# Patient Record
Sex: Female | Born: 1995
Health system: Southern US, Community
[De-identification: ages and names within clinical notes are randomized; demographics above are authoritative.]

## PROBLEM LIST (undated history)

## (undated) DIAGNOSIS — F419 Anxiety disorder, unspecified: Secondary | ICD-10-CM

## (undated) DIAGNOSIS — E079 Disorder of thyroid, unspecified: Secondary | ICD-10-CM

## (undated) HISTORY — PX: MOUTH SURGERY: SHX715

## (undated) HISTORY — PX: WISDOM TOOTH EXTRACTION: SHX21

## (undated) HISTORY — DX: Anxiety disorder, unspecified: F41.9

---
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
## 2018-06-23 ENCOUNTER — Other Ambulatory Visit: Payer: Self-pay

## 2018-06-23 ENCOUNTER — Encounter: Payer: Self-pay | Admitting: Child and Adolescent Psychiatry

## 2018-06-23 ENCOUNTER — Ambulatory Visit: Payer: BC Managed Care – PPO | Admitting: Child and Adolescent Psychiatry

## 2018-06-23 VITALS — BP 126/84 | HR 86 | Temp 99.0°F | Ht 64.96 in | Wt 197.6 lb

## 2018-06-23 DIAGNOSIS — F411 Generalized anxiety disorder: Secondary | ICD-10-CM | POA: Diagnosis not present

## 2018-06-23 DIAGNOSIS — F410 Panic disorder [episodic paroxysmal anxiety] without agoraphobia: Secondary | ICD-10-CM

## 2018-06-23 MED ORDER — BUSPIRONE HCL 10 MG PO TABS: 10.0000 mg | ORAL_TABLET | Freq: Two times a day (BID) | ORAL | 0 refills | Status: DC

## 2018-06-23 MED ORDER — SERTRALINE HCL 100 MG PO TABS: 100.0000 mg | ORAL_TABLET | Freq: Every day | ORAL | 0 refills | Status: DC

## 2018-06-23 NOTE — Progress Notes (Signed)
Psychiatric Initial Adult Assessment   Patient Identification: Colleen Tapia MRN:  [id number] Date of Evaluation:  06/24/2018 Referral Source: [facility] Chief Complaint:   Chief Complaint    Establish Care; Anxiety; Panic Attack     Visit Diagnosis:    ICD-10-CM   1. Generalized anxiety disorder with panic attacks F41.1 busPIRone (BUSPAR) 10 MG tablet   F41.0 sertraline (ZOLOFT) 100 MG tablet    History of Present Illness: This is a 22 year old Caucasian female, junior at [employer] with no significant medical history and psychiatric history significant of with generalized anxiety disorder with panic attacks and no previous psychiatric hospitalizations referred by patient's primary care physician for psychiatric evaluation and medication management as patient's medications were managed by patient's pediatrician who is now transferring her care to adult clinic.  Colleen Tapia presented on time for her scheduled appointment and was accompanied with her mother.  She was seen and evaluated alone and together with her mother.  Mother was brought into the office for interview and discussion of plan with Colleen Tapia's informed consent.  She reports that she started getting medication treatment for anxiety this March by her primary care physician who is now transferring her care to [facility] medicine clinic and therefore referred to this office for continuation of medication management for anxiety.  Colleen Tapia reports that she has been dealing with anxiety since later part of high school and first years of college however was able to manage up until December last year when she started having increased anxiety with "severe panic attacks".  She stated "anxiety got really bad..."  in the context of increased academic pressure in the college.  She reports that she often gets stuck on one thought about things that she did not do well or wrong and starting December last year it got really worse that she could not  stop these thoughts resulting in increased anxiety.   She reported that her pediatrician in March of this year started her on Zoloft which significantly helped her with her anxiety and panic attacks.  She reported that over the summer her pediatrician increased the dose of Zoloft 100 mg as a precaution.  She reports that her anxiety has been much more manageable and rates her anxiety at 5 out of 10 (10 = most anxious) and reported that she did not have any panic attacks since long time.  She also reports that she started seeing a counselor at her counseling center at [facility] which is also helped her with her anxiety.  She reports that she continues to plan to see her counselor at [facility].  She reports that during the time when her anxiety was worse she became more isolated from her friends and her mood was decreased.  She reports that her mood has improved over the time and describes her mood as "pretty chill" and has again became very sociable with her friends.  She does report depressed mood on some days out of a week however denies it being a consistent state of mood.  She reports that she was also not sleeping well at the time when her anxiety was worse because she could not decrease her thoughts which made her anxious at night however her sleep has improved since then.  She reports that she would have 1 night out of a week where she would have difficult time sleeping.  She otherwise denies any previous depressive episodes.  She denies any anhedonia, poor appetite, difficulties with concentration, psychomotor agitation or retardation, thoughts  of suicide.  She does endorse some decreased energy and feelings of worthlessness.  She denies any AVH, did not admit any delusions, denied symptoms of OCD, denied any eating disorder symptoms and denied any symptoms of mania/hypomania.  Her mother provided collateral information and reported that Colleen Tapia has been doing significantly better as compared to last year  when her anxiety was worse.  She states "it was a nigh and day..."  since Caeley started taking her Zoloft.  She denies any new concerns for Colleen Tapia.  Associated Signs/Symptoms:  Depression Symptoms:  depressed mood, feelings of worthlessness/guilt, disturbed sleep, (Hypo) Manic Symptoms:  Denies Anxiety Symptoms:  Excessive Worry, Panic Symptoms, Psychotic Symptoms:  Denies PTSD Symptoms: NA  Past Psychiatric History: Patient denies any inpatient psychiatric treatment in the past.  Patient reports that she has been seeing counselor at [facility] since last year and continuing to see counselor every other week.  She denies seeing a psychiatrist in the past.  She reports taking Zoloft 100 mg daily and BuSpar 10 mg daily since past May.  No history of SI or HI.  Previous Psychotropic Medications: Yes   Substance Abuse History in the last 12 months:  Yes.    Patient reports having 2-3 drinks every 2-3 weeks.  Denies any other substance abuse history.  Consequences of Substance Abuse: N/A NA  Past Medical History: Currently receiving Antibioitcs for UTI. Denies any other past medical history. Past Medical History:  Diagnosis Date  . Anxiety     Past Surgical History:  Procedure Laterality Date  . MOUTH SURGERY      Family Psychiatric History: Mother: Depression and Anxiety; Father: Depression and Anxiety  Family History:  Family History  Problem Relation Age of Onset  . Anxiety disorder Mother   . Depression Mother     Social History:   Social History   Socioeconomic History  . Marital status: Single    Spouse name: Not on file  . Number of children: 0  . Years of education: Not on file  . Highest education level: Some college, no degree  Occupational History  . Not on file  Social Needs  . Financial resource strain: Not hard at all  . Food insecurity:    Worry: Never true    Inability: Never true  . Transportation needs:     Medical: No    Non-medical: No  Tobacco Use  . Smoking status: Never Smoker  . Smokeless tobacco: Never Used  Substance and Sexual Activity  . Alcohol use: Yes    Alcohol/week: 3.0 - 5.0 standard drinks    Types: 3 - 4 Shots of liquor per week  . Drug use: Not Currently  . Sexual activity: Not Currently  Lifestyle  . Physical activity:    Days per week: 5 days    Minutes per session: 50 min  . Stress: Not on file  Relationships  . Social connections:    Talks on phone: Not on file    Gets together: Not on file    Attends religious service: More than 4 times per year    Active member of club or organization: Yes    Attends meetings of clubs or organizations: More than 4 times per year    Relationship status: Never married  Other Topics Concern  . Not on file  Social History Narrative  . 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  [facility] [place] [facility] [employer] [place] on holidays.  Allergies:  No Known Allergies  Metabolic Disorder Labs: No results found for: HGBA1C, MPG No results found for: PROLACTIN No results found for: CHOL, TRIG, HDL, CHOLHDL, VLDL, LDLCALC No results found for: TSH  Therapeutic Level Labs: No results found for: LITHIUM No results found for: CBMZ No results found for: VALPROATE  Current Medications: Current Outpatient Medications  Medication Sig Dispense Refill  . azithromycin (ZITHROMAX) 250 MG tablet     . busPIRone (BUSPAR) 10 MG tablet Take 1 tablet (10 mg total) by mouth 2 (two) times daily. 60 tablet 0  . cetirizine (ZYRTEC) 10 MG tablet Take by mouth.    . fluticasone (FLONASE) 50 MCG/ACT nasal spray Place into the nose.    . nitrofurantoin, macrocrystal-monohydrate, (MACROBID) 100 MG capsule     . sertraline (ZOLOFT) 100 MG tablet Take 1 tablet (100 mg  total) by mouth daily. 30 tablet 0  . SPRINTEC 28 0.25-35 MG-MCG tablet      No current facility-administered medications for this visit.     Musculoskeletal:  Gait & Station: normal Patient leans: N/A  Psychiatric Specialty Exam: Review of Systems  Constitutional: Negative for fever.  HENT: Negative.   Eyes: Negative.   Respiratory: Negative.   Cardiovascular: Negative.   Gastrointestinal: Negative.   Musculoskeletal: Negative.   Skin: Negative.   Neurological: Negative.   Endo/Heme/Allergies: Negative.   Psychiatric/Behavioral: Negative for depression, hallucinations, substance abuse and suicidal ideas. The patient is nervous/anxious. The patient does not have insomnia.     Blood pressure 126/84, pulse 86, temperature 99 F (37.2 C), temperature source Oral, height 5' 4.96" (1.65 m), weight 197 lb 9.6 oz (89.6 kg), last menstrual period 06/08/2018.Body mass index is 32.92 kg/m.  General Appearance: Casual and Well Groomed  Eye Contact:  Good  Speech:  Clear and Coherent and Normal Rate  Volume:  Normal  Mood:  "good"  Affect:  Appropriate, Congruent and Full Range  Thought Process:  Goal Directed and Linear  Orientation:  Full (Time, Place, and Person)  Thought Content:  Logical  Suicidal Thoughts:  No  Homicidal Thoughts:  No  Memory:  Immediate;   Good Recent;   Good Remote;   Good  Judgement:  Good  Insight:  Good  Psychomotor Activity:  Normal  Concentration:  Concentration: Good and Attention Span: Good  Recall:  Good  Fund of Knowledge:Good  Language: Good  Akathisia:  No  Handed:  N/A  AIMS (if indicated):  not done  Assets:  Communication Skills Desire for Improvement Financial Resources/Insurance Housing Leisure Time Physical Health Resilience Social Support Transportation Vocational/Educational  ADL's:  Intact  Cognition: WNL  Sleep:  Fair   Screenings:  PHQ-2: 1; PHQ 9: 6 GAD 7: 8 SCARED : 43   Assessment and Plan:   - 22 YO F  with hx of anxiety disorder on Zoloft since March of this year and in counseling [facility] [employer] [facility].  - Her hx appears to be consistent with generalized anxiety disorder with panic attacks.  - Her anxiety seemed to have worsened in the context of increased academic pressure. Increased anxiety also seemed to have decreased her mood that time.  - She seems to have responded well to medication management and therapy.  - She does appear to continue to have moderate level of  anxiety, however appears to be managing well and it does not seem to be impacting her functioning. She does not appear depressed.  - She would like to keep the same doses of her medications and reported that she has been tolerating them well.  - Pt and mother denies any safety concerns.    Plan: Problem 1: Generalized Anxiety with Panic attack Plan: - Continue Zoloft 100 mg Daily          - Continue Buspar 10 mg BID          - Continue with counseling at [facility]          - Follow up in one month.           - Pt was seen for 60 minutes for face to face and greater than 50% of time was spent on counseling and coordination of care with the patient/parent discussing diagnoses, medication side effects, prognosis.       [name], MD 11/27/20193:45 PM

## 2018-06-24 ENCOUNTER — Encounter: Payer: Self-pay | Admitting: Child and Adolescent Psychiatry

## 2018-06-24 NOTE — Progress Notes (Signed)
Colleen Tapia is a 22 y.o. female in treatment for Anxiety with panic attacks and displays the following risk factors for Suicide:  Demographic factors:  Adolescent or young adult and Caucasian Current Mental Status: No plan to harm self or others Loss Factors: None reported or identified Historical Factors: Family history of mental illness or substance abuse Risk Reduction Factors: Employed, Positive social support and Positive therapeutic relationship  CLINICAL FACTORS:  Anxiety  COGNITIVE FEATURES THAT CONTRIBUTE TO RISK: None    SUICIDE RISK:  Minimal: No identifiable suicidal ideation.    Mental Status: As mentioned in H&P from today's visit.   PLAN OF CARE: As mentioned in H&P from today's visit.    Darcel SmallingHiren M Demarius Archila, MD 06/24/2018, 4:27 PM

## 2018-07-29 ENCOUNTER — Encounter: Payer: Self-pay | Admitting: Child and Adolescent Psychiatry

## 2018-07-29 ENCOUNTER — Ambulatory Visit: Payer: Self-pay | Admitting: Child and Adolescent Psychiatry

## 2018-07-29 ENCOUNTER — Other Ambulatory Visit: Payer: Self-pay

## 2018-07-29 VITALS — BP 120/75 | HR 109 | Temp 98.0°F | Wt 198.8 lb

## 2018-07-29 DIAGNOSIS — F411 Generalized anxiety disorder: Secondary | ICD-10-CM

## 2018-07-29 DIAGNOSIS — F410 Panic disorder [episodic paroxysmal anxiety] without agoraphobia: Secondary | ICD-10-CM

## 2018-07-29 MED ORDER — BUSPIRONE HCL 10 MG PO TABS: 10.0000 mg | ORAL_TABLET | Freq: Two times a day (BID) | ORAL | 3 refills | Status: DC

## 2018-07-29 MED ORDER — SERTRALINE HCL 100 MG PO TABS: 100.0000 mg | ORAL_TABLET | Freq: Every day | ORAL | 3 refills | Status: DC

## 2018-07-29 NOTE — Progress Notes (Signed)
BH MD/PA/NP OP Progress Note  07/29/2018 1:18 PM Colleen Tapia  MRN:  [id number]  Chief Complaint: Medication management follow-up for anxiety. Chief Complaint    Follow-up; Medication Refill     HPI: Patient presented on time for her scheduled follow-up appointment for anxiety.  She was last seen for initial evaluation about a month ago for anxiety.  Patient transferred her treatment for anxiety from pediatrician to this clinic about a month ago since she turned 23.  Patient was continued on Zoloft 100 mg after the initial evaluation.  Patient denies any new concerns for today's visit.  She reports that her anxiety has been stable, and manageable.  She denies feeling depressed, eating and sleeping well, denies any suicidal or homicidal thoughts. She reported that she has been on vacation for the last 2 weeks and will be going back to college this weekend.  She reported that she spent her vacation well, and it was relaxing for her.  She reports that she is looking forward to be back at the college and has missed her friends.  We discussed to continue Zoloft 100 mg daily since her anxiety has been stable.  Colleen Tapia reports that she will be back in the town around spring break and will follow-up with this Clinical research associatewriter then.  Discussed to call the clinic if she needs to see this Clinical research associatewriter earlier. Visit Diagnosis:    ICD-10-CM   1. Generalized anxiety disorder with panic attacks F41.1 sertraline (ZOLOFT) 100 MG tablet   F41.0 busPIRone (BUSPAR) 10 MG tablet    Past Psychiatric History: As mentioned in initial H&P, reviewed today, no change  Past Medical History:  Past Medical History:  Diagnosis Date  . Anxiety     Past Surgical History:  Procedure Laterality Date  . MOUTH SURGERY      Family Psychiatric History: As mentioned in initial H&P, reviewed today, no change  Family History:  Family History  Problem Relation Age of Onset  . Anxiety disorder Mother   . Depression Mother     Social  History:  Social History   Socioeconomic History  . Marital status: Single    Spouse name: Not on file  . Number of children: 0  . Years of education: Not on file  . Highest education level: Some college, no degree  Occupational History  . Not on file  Social Needs  . Financial resource strain: Not hard at all  . Food insecurity:    Worry: Never true    Inability: Never true  . Transportation needs:    Medical: No    Non-medical: No  Tobacco Use  . Smoking status: Never Smoker  . Smokeless tobacco: Never Used  Substance and Sexual Activity  . Alcohol use: Yes    Alcohol/week: 3.0 - 5.0 standard drinks    Types: 3 - 4 Shots of liquor per week  . Drug use: Not Currently  . Sexual activity: Not Currently  Lifestyle  . Physical activity:    Days per week: 5 days    Minutes per session: 50 min  . Stress: Not on file  Relationships  . Social connections:    Talks on phone: Not on file    Gets together: Not on file    Attends religious service: More than 4 times per year    Active member of club or organization: Yes    Attends meetings of clubs or organizations: More than 4 times per year    Relationship status: Never married  Other Topics Concern  . Not on file  Social History Narrative  . Not on file    Allergies: No Known Allergies  Metabolic Disorder Labs: No results found for: HGBA1C, MPG No results found for: PROLACTIN No results found for: CHOL, TRIG, HDL, CHOLHDL, VLDL, LDLCALC No results found for: TSH  Therapeutic Level Labs: No results found for: LITHIUM No results found for: VALPROATE No components found for:  CBMZ  Current Medications: Current Outpatient Medications  Medication Sig Dispense Refill  . busPIRone (BUSPAR) 10 MG tablet Take 1 tablet (10 mg total) by mouth 2 (two) times daily. 60 tablet 3  . cetirizine (ZYRTEC) 10 MG tablet Take by mouth.    . fluticasone (FLONASE) 50 MCG/ACT nasal spray Place into the nose.    . sertraline (ZOLOFT)  100 MG tablet Take 1 tablet (100 mg total) by mouth daily. 30 tablet 3  . SPRINTEC 28 0.25-35 MG-MCG tablet      No current facility-administered medications for this visit.      Musculoskeletal:  Gait & Station: normal Patient leans: N/A  Psychiatric Specialty Exam: Review of Systems  Constitutional: Negative for fever.  Neurological: Negative for seizures.  Psychiatric/Behavioral: Negative for depression, hallucinations, substance abuse and suicidal ideas. The patient is nervous/anxious. The patient does not have insomnia.     Blood pressure 120/75, pulse (!) 109, temperature 98 F (36.7 C), temperature source Oral, weight 198 lb 12.8 oz (90.2 kg).Body mass index is 33.12 kg/m.  General Appearance: Casual and Fairly Groomed  Eye Contact:  Good  Speech:  Clear and Coherent and Normal Rate  Volume:  Normal  Mood:  "good"  Affect:  Appropriate, Congruent and Full Range  Thought Process:  Goal Directed and Linear  Orientation:  Full (Time, Place, and Person)  Thought Content: Logical   Suicidal Thoughts:  No  Homicidal Thoughts:  No  Memory:  Immediate;   Good Recent;   Good Remote;   Good  Judgement:  Good  Insight:  Good  Psychomotor Activity:  Normal  Concentration:  Concentration: Good and Attention Span: Good  Recall:  Good  Fund of Knowledge: Good  Language: Good  Akathisia:  No    AIMS (if indicated): not done  Assets:  Communication Skills Desire for Improvement Financial Resources/Insurance Housing Leisure Time Physical Health Social Support Talents/Skills Transportation Vocational/Educational  ADL's:  Intact  Cognition: WNL  Sleep:  Good   Screenings:   Assessment and Plan:   Screenings: On 11/27 PHQ-2: 1; PHQ 9: 6 GAD 7: 8 SCARED : 43  Assessment and Plan:   - 23 YO F with hx of anxiety disorder on Zoloft since March of this year and in counseling at [facility] [address].  - Her hx appears to be consistent with  generalized anxiety disorder with panic attacks.  - Her anxiety seemed to have worsened in the context of increased academic pressure in 2019. Increased anxiety also seemed to have decreased her mood that time.  - She seems to have responded well to medication management and therapy.  - She does appear to continue to have moderate level of anxiety, however appears to be managing well and it does not seem to be impacting her functioning. She does not appear depressed.  - She would like to keep the same doses of her medications and reported that she has been tolerating them well.  - Pt denies any safety concerns.    Plan: Problem 1: Generalized Anxiety with Panic  attack Plan: - Continue Zoloft 100 mg Daily          - Continue Buspar 10 mg BID          - [facility]          - Follow up in one month.           - Pt was seen for 15 minutes for face to face and greater than 50% of time was spent on counseling and coordination of care with the patient/parent discussing diagnoses, medication side effects, prognosis.      Darcel SmallingHiren M Aurorah Schlachter, MD 07/29/2018, 1:18 PM

## 2018-09-23 DIAGNOSIS — B9789 Other viral agents as the cause of diseases classified elsewhere: Secondary | ICD-10-CM | POA: Diagnosis not present

## 2018-09-23 DIAGNOSIS — R070 Pain in throat: Secondary | ICD-10-CM | POA: Diagnosis not present

## 2018-09-23 DIAGNOSIS — R509 Fever, unspecified: Secondary | ICD-10-CM | POA: Diagnosis not present

## 2018-09-23 DIAGNOSIS — J069 Acute upper respiratory infection, unspecified: Secondary | ICD-10-CM | POA: Diagnosis not present

## 2018-09-24 DIAGNOSIS — J019 Acute sinusitis, unspecified: Secondary | ICD-10-CM | POA: Diagnosis not present

## 2018-09-24 DIAGNOSIS — J029 Acute pharyngitis, unspecified: Secondary | ICD-10-CM | POA: Diagnosis not present

## 2018-09-26 DIAGNOSIS — J029 Acute pharyngitis, unspecified: Secondary | ICD-10-CM | POA: Diagnosis not present

## 2018-09-26 DIAGNOSIS — Z09 Encounter for follow-up examination after completed treatment for conditions other than malignant neoplasm: Secondary | ICD-10-CM | POA: Diagnosis not present

## 2018-09-26 DIAGNOSIS — J019 Acute sinusitis, unspecified: Secondary | ICD-10-CM | POA: Diagnosis not present

## 2018-10-04 DIAGNOSIS — Z113 Encounter for screening for infections with a predominantly sexual mode of transmission: Secondary | ICD-10-CM | POA: Diagnosis not present

## 2018-10-04 DIAGNOSIS — Z713 Dietary counseling and surveillance: Secondary | ICD-10-CM | POA: Diagnosis not present

## 2018-10-04 DIAGNOSIS — F411 Generalized anxiety disorder: Secondary | ICD-10-CM | POA: Diagnosis not present

## 2018-10-04 DIAGNOSIS — Z0100 Encounter for examination of eyes and vision without abnormal findings: Secondary | ICD-10-CM | POA: Diagnosis not present

## 2018-10-04 DIAGNOSIS — Z0000 Encounter for general adult medical examination without abnormal findings: Secondary | ICD-10-CM | POA: Diagnosis not present

## 2018-10-04 DIAGNOSIS — Z6832 Body mass index (BMI) 32.0-32.9, adult: Secondary | ICD-10-CM | POA: Diagnosis not present

## 2018-10-29 ENCOUNTER — Ambulatory Visit: Payer: BC Managed Care – PPO | Admitting: Child and Adolescent Psychiatry

## 2018-11-04 ENCOUNTER — Encounter: Payer: Self-pay | Admitting: Child and Adolescent Psychiatry

## 2018-11-04 ENCOUNTER — Ambulatory Visit (INDEPENDENT_AMBULATORY_CARE_PROVIDER_SITE_OTHER): Payer: Self-pay | Admitting: Child and Adolescent Psychiatry

## 2018-11-04 ENCOUNTER — Other Ambulatory Visit: Payer: Self-pay

## 2018-11-04 DIAGNOSIS — F411 Generalized anxiety disorder: Secondary | ICD-10-CM

## 2018-11-04 DIAGNOSIS — F410 Panic disorder [episodic paroxysmal anxiety] without agoraphobia: Secondary | ICD-10-CM

## 2018-11-04 MED ORDER — SERTRALINE HCL 100 MG PO TABS: 100.0000 mg | ORAL_TABLET | Freq: Every day | ORAL | 2 refills | Status: DC

## 2018-11-04 MED ORDER — BUSPIRONE HCL 10 MG PO TABS: 10.0000 mg | ORAL_TABLET | Freq: Two times a day (BID) | ORAL | 2 refills | Status: DC

## 2018-11-04 NOTE — Progress Notes (Signed)
Tc on  11-04-18 @ 4:41 Pt medical and surgical hx was reviewed with no changes. Pt allergies were reviewed with no changes. Pt medications and pharmacy were reviewed and updated. No vitals were done today this is a phone visit.

## 2018-11-04 NOTE — Progress Notes (Signed)
Virtual Visit via Telephone Note  I connected with Colleen Tapia on 11/04/18 at  4:00 PM EDT by telephone and verified that I am speaking with the correct person using two identifiers.   I discussed the limitations, risks, security and privacy concerns of performing an evaluation and management service by telephone and the availability of in person appointments. I also discussed with the patient that there may be a patient responsible charge related to this service. The patient expressed understanding and agreed to proceed.   History of Present Illness: 23 YO [place] F with hx of Generalized anxiety disorder with panic attack on Zoloft since March, 2019 referred by her PCP for psychiatric med management at the end of 2019 was evaluated over the telephone for medication management follow up for anxiety. Colleen Tapia shares that she was doing well until her college closed and she had to move with her parents. She reported that when they closed the college she had couple of panic attacks, denies any panic attacks since then which is past three weeks. She reports that she more stressed but reports that it is "normal" given the current circumstances of outbreak. She reports that she is sad that she is not able to see her friends but normalizes it. She reports that all of them are at home(parents and her brother) and although stressful they are managing each other ok. Denies being depressed, anhedonia, SI, reports eating and sleeping well. She reports that her anxiety is manageable, not impacting her functioning and would like to continue the same dose. She reported that she is adherent to her medications.   Observations/Objective:  Appearance: unable to assess since virtual visit was over the telephone Attitude: calm, cooperative with good eye contact Activity: unable to assess since virtual visit was over the telephone Speech: normal rate, rhythm and volume Thought Process: Logical, linear, and goal-directed.   Associations: no looseness, tangentiality, circumstantiality, flight of ideas, thought blocking or word salad noted Thought Content: (abnormal/psychotic thoughts): no abnormal or delusional thought process evidenced SI/HI: denies Si/Hi Perception: no illusions or visual/auditory hallucinations noted; Mood & Affect: "good"/unable to assess since virtual visit was over the telephone  Judgment & Insight: both fair Attention and Concentration : Good Cognition : WNL Language : Good ADL - Intact    Assessment and Plan: - 23 YO F with hx of anxiety disorder on Zoloft since March of this year and in counseling [facility] [employer].  - Her hx appears to be consistent with generalized anxiety disorder with panic attacks.  - Her anxiety seemed to have worsened in the context of increased academic pressure in 2019. Increased anxiety also seemed to have decreased her mood that time.  - She seems to have responded well to medication management and therapy.  - She does appear to continue to have mild to moderate level of anxiety which seems to have increase due to outbreak, however appears to be managing well and it does not seem to be impacting her functioning. She does not appear depressed.  - She would like to keep the same doses of her medications and reported that she has been tolerating them well.   Plan: Problem 1: Generalized Anxiety with Panic attack(chronic) Plan: - Continue Zoloft 100 mg Daily - Continue Buspar 10 mg BID - [facility] - Follow up in one month.  - Pt was seen for 15 minutes for face to face and greater than 50% of time was  spent on counseling and coordination of care with the patient/parent discussing diagnoses, medication side effects, prognosis.    Follow Up Instructions:    I discussed the assessment and treatment plan with the patient. The patient was provided an  opportunity to ask questions and all were answered. The patient agreed with the plan and demonstrated an understanding of the instructions.   The patient was advised to call back or seek an in-person evaluation if the symptoms worsen or if the condition fails to improve as anticipated.  I provided 15 minutes of non-face-to-face time during this encounter.   [name], MD

## 2018-12-23 DIAGNOSIS — J302 Other seasonal allergic rhinitis: Secondary | ICD-10-CM | POA: Insufficient documentation

## 2018-12-23 DIAGNOSIS — F411 Generalized anxiety disorder: Secondary | ICD-10-CM | POA: Diagnosis not present

## 2019-01-05 DIAGNOSIS — S93402A Sprain of unspecified ligament of left ankle, initial encounter: Secondary | ICD-10-CM | POA: Diagnosis not present

## 2019-01-12 ENCOUNTER — Encounter: Payer: Self-pay | Admitting: Child and Adolescent Psychiatry

## 2019-01-12 ENCOUNTER — Other Ambulatory Visit: Payer: Self-pay

## 2019-01-12 ENCOUNTER — Ambulatory Visit (INDEPENDENT_AMBULATORY_CARE_PROVIDER_SITE_OTHER): Payer: Self-pay | Admitting: Child and Adolescent Psychiatry

## 2019-01-12 DIAGNOSIS — F410 Panic disorder [episodic paroxysmal anxiety] without agoraphobia: Secondary | ICD-10-CM

## 2019-01-12 DIAGNOSIS — F411 Generalized anxiety disorder: Secondary | ICD-10-CM

## 2019-01-12 MED ORDER — SERTRALINE HCL 100 MG PO TABS: 100.0000 mg | ORAL_TABLET | Freq: Every day | ORAL | 2 refills | Status: DC

## 2019-01-12 MED ORDER — BUSPIRONE HCL 10 MG PO TABS: 10.0000 mg | ORAL_TABLET | Freq: Two times a day (BID) | ORAL | 2 refills | Status: DC

## 2019-01-12 NOTE — Progress Notes (Signed)
Virtual Visit via Video Note  I connected with Colleen Tapia on 01/12/19 at  4:00 PM EDT by a video enabled telemedicine application and verified that I am speaking with the correct person using two identifiers.  Location: Patient: Home Provider: Office   I discussed the limitations of evaluation and management by telemedicine and the availability of in person appointments. The patient expressed understanding and agreed to proceed.    BH MD/PA/NP OP Progress Note  01/12/2019 4:07 PM Colleen Tapia  MRN:  [id number]  Chief Complaint: Medication management follow up for anxiety HPI: This is a 23 year old Caucasian female with generalized anxiety disorder with panic attacks on Zoloft since March 2019 was seen and evaluated for medication management follow-up for anxiety.  Amil AmenJulia appeared calm, cooperative, pleasant with bright and broad affect. 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 She reports that her anxiety is stable, denies any new psychosocial stressor, reports eating well and sleep is fair. She denies feeling depressed, denies any thoughts of suicide or self harm. She is not in counseling but [facility] when she resumes her studies next semester.   Visit Diagnosis:    ICD-10-CM   1. Generalized anxiety disorder with panic attacks  F41.1 busPIRone (BUSPAR) 10 MG tablet   F41.0 sertraline (ZOLOFT) 100 MG tablet    Past Psychiatric History: As mentioned in initial H&P, reviewed today, no change   Past Medical History:  Past Medical History:  Diagnosis Date  . Anxiety     Past Surgical History:  Procedure Laterality Date  . MOUTH SURGERY      Family Psychiatric History: As mentioned in initial H&P, reviewed today, no change   Family History:  Family History   Problem Relation Age of Onset  . Anxiety disorder Mother   . Depression Mother     Social History:  Social History   Socioeconomic History  . Marital status: Single    Spouse name: Not on file  . Number of children: 0  . Years of education: Not on file  . Highest education level: Some college, no degree  Occupational History  . Not on file  Social Needs  . Financial resource strain: Not hard at all  . Food insecurity    Worry: Never true    Inability: Never true  . Transportation needs    Medical: No    Non-medical: No  Tobacco Use  . Smoking status: Never Smoker  . Smokeless tobacco: Never Used  Substance and Sexual Activity  . Alcohol use: Yes    Alcohol/week: 3.0 - 5.0 standard drinks    Types: 3 - 4 Shots of liquor per week  . Drug use: Not Currently  . Sexual activity: Not Currently  Lifestyle  . Physical activity    Days per week: 5 days    Minutes per session: 50 min  . Stress: Not on file  Relationships  . Social Musicianconnections    Talks on phone: Not on file    Gets together: Not on file    Attends religious service: More than 4 times per year    Active member of club or organization: Yes    Attends meetings of clubs or organizations: More than 4 times per year    Relationship status: Never  married  Other Topics Concern  . Not on file  Social History Narrative  . Not on file    Allergies: No Known Allergies  Metabolic Disorder Labs: No results found for: HGBA1C, MPG No results found for: PROLACTIN No results found for: CHOL, TRIG, HDL, CHOLHDL, VLDL, LDLCALC No results found for: TSH  Therapeutic Level Labs: No results found for: LITHIUM No results found for: VALPROATE No components found for:  CBMZ  Current Medications: Current Outpatient Medications  Medication Sig Dispense Refill  . busPIRone (BUSPAR) 10 MG tablet Take 1 tablet (10 mg total) by mouth 2 (two) times daily. 60 tablet 2  . cetirizine (ZYRTEC) 10 MG tablet Take by mouth.    .  fluticasone (FLONASE) 50 MCG/ACT nasal spray Place into the nose.    . sertraline (ZOLOFT) 100 MG tablet Take 1 tablet (100 mg total) by mouth daily. 30 tablet 2  . SPRINTEC 28 0.25-35 MG-MCG tablet      No current facility-administered medications for this visit.      Musculoskeletal: Strength & Muscle Tone: unable to assess since visit was over the telemedicine. Gait & Station: unable to assess since visit was over the telemedicine. Patient leans: N/A  Psychiatric Specialty Exam: ROSReview of 12 systems negative except as mentioned in HPI  There were no vitals taken for this visit.There is no height or weight on file to calculate BMI.  General Appearance: Casual and Well Groomed  Eye Contact:  Good  Speech:  Clear and Coherent and Normal Rate  Volume:  Normal  Mood:  "good"  Affect:  Appropriate, Congruent and Full Range  Thought Process:  Goal Directed and Linear  Orientation:  Full (Time, Place, and Person)  Thought Content: Logical   Suicidal Thoughts:  No  Homicidal Thoughts:  No  Memory:  Immediate;   Fair Recent;   Fair Remote;   Fair  Judgement:  Good  Insight:  Good  Psychomotor Activity:  Normal  Concentration:  Concentration: Good and Attention Span: Good  Recall:  Good  Fund of Knowledge: Good  Language: Good  Akathisia:  No    AIMS (if indicated): not done  Assets:  Communication Skills Desire for Improvement Financial Resources/Insurance Housing Leisure Time [place] Talents/Skills Transportation Vocational/Educational  ADL's:  Intact  Cognition: WNL  Sleep:  Good   Screenings:   Assessment and Plan:   - 23 yo F with hx of anxiety disorder on Zoloft since March of 2019 and was previously in counseling at [facility].  - Her hx appears to be consistent with generalized anxiety disorder with panic attacks.  - Her anxiety seemed to have worsened in the context of increased academic pressurein  2019. Increased anxiety also seemed to have decreased her mood that time.  - She seems to continue to respond well to medication.    Plan: Problem 1: Generalized Anxiety with Panic attack(chronic) Plan: - Continue Zoloft 100 mg Daily - Continue Buspar 10 mg BID - Resume with counseling at [facility] in fall - Follow up in one month.  - Pt was seen for98minutes for face to face and greater than 50% of time was spent on counseling and coordination of care with the patient/parent discussing diagnoses, treatment plan, medications and recommendation to resume counseling at [place] [facility] once she starts her fall semester.     Follow Up Instructions:    I discussed the assessment and treatment plan with the patient. The patient  was provided an opportunity to ask questions and all were answered. The patient agreed with the plan and demonstrated an understanding of the instructions.   The patient was advised to call back or seek an in-person evaluation if the symptoms worsen or if the condition fails to improve as anticipated.  I provided 15 minutes of non-face-to-face time during this encounter.   Darcel SmallingHiren M Umrania, MD    Darcel SmallingHiren M Umrania, MD 01/12/2019, 4:07 PM

## 2019-01-22 ENCOUNTER — Other Ambulatory Visit: Payer: Self-pay | Admitting: *Deleted

## 2019-01-22 DIAGNOSIS — Z20822 Contact with and (suspected) exposure to covid-19: Secondary | ICD-10-CM

## 2019-01-22 DIAGNOSIS — R6889 Other general symptoms and signs: Secondary | ICD-10-CM | POA: Diagnosis not present

## 2019-01-28 LAB — NOVEL CORONAVIRUS, NAA: SARS-CoV-2, NAA: NOT DETECTED

## 2019-02-03 DIAGNOSIS — S62657A Nondisplaced fracture of medial phalanx of left little finger, initial encounter for closed fracture: Secondary | ICD-10-CM | POA: Diagnosis not present

## 2019-03-17 ENCOUNTER — Encounter: Payer: Self-pay | Admitting: Child and Adolescent Psychiatry

## 2019-03-17 ENCOUNTER — Other Ambulatory Visit: Payer: Self-pay

## 2019-03-17 ENCOUNTER — Ambulatory Visit (INDEPENDENT_AMBULATORY_CARE_PROVIDER_SITE_OTHER): Payer: Self-pay | Admitting: Child and Adolescent Psychiatry

## 2019-03-17 DIAGNOSIS — F411 Generalized anxiety disorder: Secondary | ICD-10-CM

## 2019-03-17 DIAGNOSIS — F410 Panic disorder [episodic paroxysmal anxiety] without agoraphobia: Secondary | ICD-10-CM

## 2019-03-17 MED ORDER — SERTRALINE HCL 100 MG PO TABS: 100.0000 mg | ORAL_TABLET | Freq: Every day | ORAL | 2 refills | Status: DC

## 2019-03-17 MED ORDER — BUSPIRONE HCL 10 MG PO TABS: 10.0000 mg | ORAL_TABLET | Freq: Two times a day (BID) | ORAL | 2 refills | Status: DC

## 2019-03-17 NOTE — Progress Notes (Signed)
Virtual Visit via Video Note  I connected with Colleen Tapia on 03/17/19 at  2:30 PM EDT by a video enabled telemedicine application and verified that I am speaking with the correct person using two identifiers.  Location: Patient: Home Provider: Office   I discussed the limitations of evaluation and management by telemedicine and the availability of in person appointments. The patient expressed understanding and agreed to proceed.    I discussed the assessment and treatment plan with the patient. The patient was provided an opportunity to ask questions and all were answered. The patient agreed with the plan and demonstrated an understanding of the instructions.   The patient was advised to call back or seek an in-person evaluation if the symptoms worsen or if the condition fails to improve as anticipated.  I provided 20 minutes of non-face-to-face time during this encounter.   Orlene Erm, MD     [facility] MD/PA/NP OP Progress Note  03/17/2019 2:50 PM Colleen Tapia  MRN:  [id number]  Chief Complaint: Medication management follow-up for anxiety.  HPI: This is a 23 year old Caucasian female with generalized anxiety disorder with panic attacks on Zoloft since March 2019 was seen and evaluated over telemedicine encounter for medication management follow-up.  Tykisha appeared calm, cooperative, pleasant with bright and broad range of affect.  She reports that she had moved to [facility] for her school year.  She reports that she had only 1 class in person but since today they have moved all the classes online.  She reports that she had slightly elevated level of anxiety not knowing what insisted will be doing for the fall semester but reports that anxiety has been stable and manageable.  She denies it impacting her functioning.  She reports that she has continued taking Zoloft 100 mg once a day and BuSpar 10 mg 2 times a day.  She reports that her mood has been ""good", denies feeling depressed,  denies anhedonia, denies thoughts of self-harm or suicide.  She reports that she does not have a counselor at the counseling center anymore but will be looking into supportive counseling from counseling center if they offer virtual.  She has been eating and sleeping well.   Visit Diagnosis:    ICD-10-CM   1. Generalized anxiety disorder with panic attacks  F41.1 sertraline (ZOLOFT) 100 MG tablet   F41.0 busPIRone (BUSPAR) 10 MG tablet    Past Psychiatric History: As mentioned in initial H&P, reviewed today, no change   Past Medical History:  Past Medical History:  Diagnosis Date  . Anxiety     Past Surgical History:  Procedure Laterality Date  . MOUTH SURGERY      Family Psychiatric History: As mentioned in initial H&P, reviewed today, no change   Family History:  Family History  Problem Relation Age of Onset  . Anxiety disorder Mother   . Depression Mother     Social History:  Social History   Socioeconomic History  . Marital status: Single    Spouse name: Not on file  . Number of children: 0  . Years of education: Not on file  . Highest education level: Some college, no degree  Occupational History  . Not on file  Social Needs  . Financial resource strain: Not hard at all  . Food insecurity    Worry: Never true    Inability: Never true  . Transportation needs    Medical: No    Non-medical: No  Tobacco Use  . Smoking  status: Never Smoker  . Smokeless tobacco: Never Used  Substance and Sexual Activity  . Alcohol use: Yes    Alcohol/week: 3.0 - 5.0 standard drinks    Types: 3 - 4 Shots of liquor per week  . Drug use: Not Currently  . Sexual activity: Not Currently  Lifestyle  . Physical activity    Days per week: 5 days    Minutes per session: 50 min  . Stress: Not on file  Relationships  . Social Musicianconnections    Talks on phone: Not on file    Gets together: Not on file    Attends religious service: More than 4 times per year    Active member of club  or organization: Yes    Attends meetings of clubs or organizations: More than 4 times per year    Relationship status: Never married  Other Topics Concern  . Not on file  Social History Narrative  . Not on file    Allergies: No Known Allergies  Metabolic Disorder Labs: No results found for: HGBA1C, MPG No results found for: PROLACTIN No results found for: CHOL, TRIG, HDL, CHOLHDL, VLDL, LDLCALC No results found for: TSH  Therapeutic Level Labs: No results found for: LITHIUM No results found for: VALPROATE No components found for:  CBMZ  Current Medications: Current Outpatient Medications  Medication Sig Dispense Refill  . busPIRone (BUSPAR) 10 MG tablet Take 1 tablet (10 mg total) by mouth 2 (two) times daily. 60 tablet 2  . cetirizine (ZYRTEC) 10 MG tablet Take by mouth.    . fluticasone (FLONASE) 50 MCG/ACT nasal spray Place into the nose.    . sertraline (ZOLOFT) 100 MG tablet Take 1 tablet (100 mg total) by mouth daily. 30 tablet 2  . SPRINTEC 28 0.25-35 MG-MCG tablet      No current facility-administered medications for this visit.      Musculoskeletal: Strength & Muscle Tone: unable to assess since visit was over the telemedicine.. Gait & Station: unable to assess since visit was over the telemedicine. Patient leans: N/A  Psychiatric Specialty Exam: ROSReview of 12 systems negative except as mentioned in HPI   There were no vitals taken for this visit.There is no height or weight on file to calculate BMI.  General Appearance: Casual and Well Groomed  Eye Contact:  Good  Speech:  Clear and Coherent and Normal Rate  Volume:  Normal  Mood:  "good"  Affect:  Appropriate, Congruent and Full Range  Thought Process:  Goal Directed and Linear  Orientation:  Full (Time, Place, and Person)  Thought Content: Logical   Suicidal Thoughts:  No  Homicidal Thoughts:  No  Memory:  Immediate;   Fair Recent;   Fair Remote;   Fair  Judgement:  Good  Insight:  Good   Psychomotor Activity:  Normal  Concentration:  Concentration: Good and Attention Span: Good  Recall:  Good  Fund of Knowledge: Good  Language: Good  Akathisia:  No    AIMS (if indicated): not done  Assets:  Communication Skills Desire for Improvement Financial Resources/Insurance Housing Leisure Time Physical Health Social Support Talents/Skills Transportation Vocational/Educational  ADL's:  Intact  Cognition: WNL  Sleep:  Good   Screenings:   Assessment and Plan:   - 23 yo F with hx of anxiety disorder on Zoloft since March of 2019 and [facility]  She is diagnosed with generalized anxiety disorder with panic attacks and seems to  be responding well to her current medication which is Zoloft 100 mg once a day and BuSpar 10 mg 2 times a day.  She is currently not in counseling but will be looking for resources at counseling center at an [facility].    Plan: Problem 1: Generalized Anxiety with Panic attack(chronic) Plan: - Continue Zoloft 100 mg Daily - Continue Buspar 10 mg BID - [facility], pt will look into this.  - Follow up in three months.        Darcel SmallingHiren M Izzabella Besse, MD 03/17/2019, 2:50 PM

## 2019-03-21 DIAGNOSIS — Z20828 Contact with and (suspected) exposure to other viral communicable diseases: Secondary | ICD-10-CM | POA: Diagnosis not present

## 2019-03-22 DIAGNOSIS — Z6833 Body mass index (BMI) 33.0-33.9, adult: Secondary | ICD-10-CM | POA: Diagnosis not present

## 2019-03-22 DIAGNOSIS — R070 Pain in throat: Secondary | ICD-10-CM | POA: Diagnosis not present

## 2019-03-22 DIAGNOSIS — J019 Acute sinusitis, unspecified: Secondary | ICD-10-CM | POA: Diagnosis not present

## 2019-05-30 ENCOUNTER — Ambulatory Visit: Payer: Self-pay | Admitting: Child and Adolescent Psychiatry

## 2019-06-17 ENCOUNTER — Encounter: Payer: Self-pay | Admitting: Child and Adolescent Psychiatry

## 2019-06-17 ENCOUNTER — Other Ambulatory Visit: Payer: Self-pay

## 2019-06-17 ENCOUNTER — Ambulatory Visit (INDEPENDENT_AMBULATORY_CARE_PROVIDER_SITE_OTHER): Payer: Self-pay | Admitting: Child and Adolescent Psychiatry

## 2019-06-17 DIAGNOSIS — F410 Panic disorder [episodic paroxysmal anxiety] without agoraphobia: Secondary | ICD-10-CM

## 2019-06-17 DIAGNOSIS — F411 Generalized anxiety disorder: Secondary | ICD-10-CM

## 2019-06-17 MED ORDER — BUSPIRONE HCL 10 MG PO TABS: 10.0000 mg | ORAL_TABLET | Freq: Two times a day (BID) | ORAL | 2 refills | Status: DC

## 2019-06-17 MED ORDER — SERTRALINE HCL 100 MG PO TABS: 100.0000 mg | ORAL_TABLET | Freq: Every day | ORAL | 2 refills | Status: DC

## 2019-06-17 NOTE — Progress Notes (Signed)
Virtual Visit via Video Note  I connected with Colleen Tapia on 06/17/19 at 10:30 AM EST by a video enabled telemedicine application and verified that I am speaking with the correct person using two identifiers.  Location: Patient: Home Provider: Office   I discussed the limitations of evaluation and management by telemedicine and the availability of in person appointments. The patient expressed understanding and agreed to proceed.    I discussed the assessment and treatment plan with the patient. The patient was provided an opportunity to ask questions and all were answered. The patient agreed with the plan and demonstrated an understanding of the instructions.   The patient was advised to call back or seek an in-person evaluation if the symptoms worsen or if the condition fails to improve as anticipated.  I provided 15 minutes of non-face-to-face time during this encounter.   Orlene Erm, MD     [facility] MD/PA/NP OP Progress Note  06/17/2019 10:45 AM Colleen Tapia  MRN:  [id number]  Chief Complaint:  Medication management follow up for anxiety  HPI:   This is a 23 year old Caucasian female with generalized anxiety disorder with panic attack on Zoloft since March 2019 was seen and evaluated over telemedicine encounter for medication management follow-up.  Colleen Tapia appeared calm, cooperative, pleasant with bright and broad affect.  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  Reports that she has been continuing to take her Zoloft and BuSpar as prescribed and would like to continue the same.  We discussed that if her anxiety remains stable when she returns to campus he may think of decreasing the Zoloft  or BuSpar.  She verbalized understanding.  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   Visit Diagnosis:    ICD-10-CM   1. Generalized anxiety disorder with panic attacks  F41.1 sertraline (ZOLOFT) 100 MG tablet   F41.0 busPIRone (BUSPAR) 10 MG tablet    Past Psychiatric History: As mentioned in initial H&P, reviewed today, no change  Past Medical History:  Past Medical History:  Diagnosis Date  . Anxiety     Past Surgical History:  Procedure Laterality Date  . MOUTH SURGERY      Family Psychiatric History: As mentioned in initial H&P, reviewed today, no change  Family History:  Family History  Problem Relation Age of Onset  . Anxiety disorder Mother   . Depression Mother     Social History:  Social History   Socioeconomic History  . Marital status: Single    Spouse name: Not on file  . Number of children: 0  . Years of education: Not on file  . Highest education level: Some college, no degree  Occupational History  . Not on file  Social Needs  . Financial resource strain: Not hard at all  . Food insecurity    Worry: Never true    Inability: Never true  . Transportation needs    Medical: No    Non-medical: No  Tobacco Use  . Smoking status: Never Smoker  . Smokeless tobacco: Never Used  Substance and Sexual Activity  . Alcohol use: Yes  Alcohol/week: 3.0 - 5.0 standard drinks    Types: 3 - 4 Shots of liquor per week  . Drug use: Not Currently  . Sexual activity: Not Currently  Lifestyle  . Physical activity    Days per week: 5 days    Minutes per session: 50 min  . Stress: Not on file  Relationships  . Social Musicianconnections    Talks on phone: Not on file    Gets together: Not on file    Attends religious service: More than 4 times per year    Active member of club or organization: Yes    Attends meetings of clubs or  organizations: More than 4 times per year    Relationship status: Never married  Other Topics Concern  . Not on file  Social History Narrative  . Not on file    Allergies: No Known Allergies  Metabolic Disorder Labs: No results found for: HGBA1C, MPG No results found for: PROLACTIN No results found for: CHOL, TRIG, HDL, CHOLHDL, VLDL, LDLCALC No results found for: TSH  Therapeutic Level Labs: No results found for: LITHIUM No results found for: VALPROATE No components found for:  CBMZ  Current Medications: Current Outpatient Medications  Medication Sig Dispense Refill  . busPIRone (BUSPAR) 10 MG tablet Take 1 tablet (10 mg total) by mouth 2 (two) times daily. 60 tablet 2  . cetirizine (ZYRTEC) 10 MG tablet Take by mouth.    . fluticasone (FLONASE) 50 MCG/ACT nasal spray Place into the nose.    . sertraline (ZOLOFT) 100 MG tablet Take 1 tablet (100 mg total) by mouth daily. 30 tablet 2  . SPRINTEC 28 0.25-35 MG-MCG tablet      No current facility-administered medications for this visit.      Musculoskeletal: Strength & Muscle Tone: unable to assess since visit was over the telemedicine. Gait & Station: unable to assess since visit was over the telemedicine. Patient leans: N/A  Psychiatric Specialty Exam: ROSReview of 12 systems negative except as mentioned in HPI   There were no vitals taken for this visit.There is no height or weight on file to calculate BMI.  General Appearance: Casual and Well Groomed  Eye Contact:  Good  Speech:  Clear and Coherent and Normal Rate  Volume:  Normal  Mood:  "good"  Affect:  Appropriate, Congruent and Full Range  Thought Process:  Goal Directed and Linear  Orientation:  Full (Time, Place, and Person)  Thought Content: Logical   Suicidal Thoughts:  No  Homicidal Thoughts:  No  Memory:  Immediate;   Fair Recent;   Fair Remote;   Fair  Judgement:  Good  Insight:  Good  Psychomotor Activity:  Normal  Concentration:   Concentration: Good and Attention Span: Good  Recall:  Good  Fund of Knowledge: Good  Language: Good  Akathisia:  No    AIMS (if indicated): not done  Assets:  Communication Skills Desire for Improvement Financial Resources/Insurance Housing Leisure Time Physical Health Social Support Talents/Skills Transportation Vocational/Educational  ADL's:  Intact  Cognition: WNL  Sleep:  Good   Screenings:   Assessment and Plan:   - 23 yo F with hx of anxiety disorder on Zoloft since March of 2019 and [facility] [employer] [employer].  She is diagnosed with generalized anxiety disorder with panic attacks and seems to be responding well to her current medication which is Zoloft 100 mg once a day and BuSpar 10 mg 2 times a  day.  Her anxiety remains stable on current mes.    Plan: Problem 1: Generalized Anxiety with Panic attack(chronic) Plan: - Continue Zoloft 100 mg Daily - Continue Buspar 10 mg BID - Will look into Counseling Center if needed, currently doing better.           - Will consider decreasing Zoloft/Buspar if her anxiety remains stable.  - Follow up in three months or early if needed. [name], MD 06/17/2019, 10:45 AM

## 2019-06-19 DIAGNOSIS — Z20828 Contact with and (suspected) exposure to other viral communicable diseases: Secondary | ICD-10-CM | POA: Diagnosis not present

## 2019-06-19 DIAGNOSIS — Z1159 Encounter for screening for other viral diseases: Secondary | ICD-10-CM | POA: Diagnosis not present

## 2019-07-11 DIAGNOSIS — R509 Fever, unspecified: Secondary | ICD-10-CM | POA: Diagnosis not present

## 2019-07-11 DIAGNOSIS — Z20828 Contact with and (suspected) exposure to other viral communicable diseases: Secondary | ICD-10-CM | POA: Diagnosis not present

## 2019-07-12 DIAGNOSIS — R519 Headache, unspecified: Secondary | ICD-10-CM | POA: Diagnosis not present

## 2019-07-12 DIAGNOSIS — B9789 Other viral agents as the cause of diseases classified elsewhere: Secondary | ICD-10-CM | POA: Diagnosis not present

## 2019-07-12 DIAGNOSIS — R509 Fever, unspecified: Secondary | ICD-10-CM | POA: Diagnosis not present

## 2019-07-12 DIAGNOSIS — J028 Acute pharyngitis due to other specified organisms: Secondary | ICD-10-CM | POA: Diagnosis not present

## 2019-07-25 DIAGNOSIS — J0110 Acute frontal sinusitis, unspecified: Secondary | ICD-10-CM | POA: Diagnosis not present

## 2019-08-12 DIAGNOSIS — R350 Frequency of micturition: Secondary | ICD-10-CM | POA: Diagnosis not present

## 2019-08-12 DIAGNOSIS — Z6833 Body mass index (BMI) 33.0-33.9, adult: Secondary | ICD-10-CM | POA: Diagnosis not present

## 2019-08-12 DIAGNOSIS — N3000 Acute cystitis without hematuria: Secondary | ICD-10-CM | POA: Diagnosis not present

## 2019-09-23 ENCOUNTER — Encounter: Payer: Self-pay | Admitting: Child and Adolescent Psychiatry

## 2019-09-23 ENCOUNTER — Ambulatory Visit (INDEPENDENT_AMBULATORY_CARE_PROVIDER_SITE_OTHER): Payer: BC Managed Care – PPO | Admitting: Child and Adolescent Psychiatry

## 2019-09-23 ENCOUNTER — Other Ambulatory Visit: Payer: Self-pay

## 2019-09-23 DIAGNOSIS — F411 Generalized anxiety disorder: Secondary | ICD-10-CM | POA: Diagnosis not present

## 2019-09-23 DIAGNOSIS — F410 Panic disorder [episodic paroxysmal anxiety] without agoraphobia: Secondary | ICD-10-CM

## 2019-09-23 MED ORDER — BUSPIRONE HCL 10 MG PO TABS: 10.0000 mg | ORAL_TABLET | Freq: Two times a day (BID) | ORAL | 2 refills | Status: DC

## 2019-09-23 MED ORDER — SERTRALINE HCL 100 MG PO TABS: 100.0000 mg | ORAL_TABLET | Freq: Every day | ORAL | 2 refills | Status: DC

## 2019-09-23 NOTE — Progress Notes (Signed)
Virtual Visit via Video Note  I connected with Colleen Tapia on 09/23/19 at 10:30 AM EST by a video enabled telemedicine application and verified that I am speaking with the correct person using two identifiers.  Location: Patient: Home Provider: Office   I discussed the limitations of evaluation and management by telemedicine and the availability of in person appointments. The patient expressed understanding and agreed to proceed.  I discussed the assessment and treatment plan with the patient. The patient was provided an opportunity to ask questions and all were answered. The patient agreed with the plan and demonstrated an understanding of the instructions.   The patient was advised to call back or seek an in-person evaluation if the symptoms worsen or if the condition fails to improve as anticipated.  I provided 15 minutes of non-face-to-face time during this encounter.   [name], MD     [facility] MD/PA/NP OP Progress Note  09/23/2019 11:12 AM Colleen Tapia  MRN:  [id number]  Chief Complaint: Medication management follow-up for anxiety.  HPI:   This is a 24 year old Caucasian female with generalized anxiety disorder with panic attack on Zoloft since March 2019 was seen and evaluated over telemedicine encounter for medication management follow-up.  Colleen Tapia denied any new concerns for today's visit and reports that her anxiety remains stable.  She rates her anxiety at 5 out of 10(10 = most anxious), denies it interfering with her functioning and continues to do well with her college.  She had applied to 8 graduate school for public health and accepted and 5 and waiting to hear from [facility].  Colleen Tapia denies problems with mood, denies feeling depressed or sad, denies anhedonia, thoughts of suicide or self-harm.  She reports that she has been sleeping well but can do better.  We discussed about sleep hygiene.  She reports that she has been adherent to her medications and  denies any problems with medications.   Visit Diagnosis:    ICD-10-CM   1. Generalized anxiety disorder with panic attacks  F41.1 sertraline (ZOLOFT) 100 MG tablet   F41.0 busPIRone (BUSPAR) 10 MG tablet    Past Psychiatric History: As mentioned in initial H&P, reviewed today, no change  Past Medical History:  Past Medical History:  Diagnosis Date  . Anxiety     Past Surgical History:  Procedure Laterality Date  . MOUTH SURGERY      Family Psychiatric History: As mentioned in initial H&P, reviewed today, no change  Family History:  Family History  Problem Relation Age of Onset  . Anxiety disorder Mother   . Depression Mother     Social History:  Social History   Socioeconomic History  . Marital status: Single    Spouse name: Not on file  . Number of children: 0  . Years of education: Not on file  . Highest education level: Some college, no degree  Occupational History  . Not on file  Tobacco Use  . Smoking status: Never Smoker  . Smokeless tobacco: Never Used  Substance and Sexual Activity  . Alcohol use: Yes    Alcohol/week: 3.0 - 5.0 standard drinks    Types: 3 - 4 Shots of liquor per week  . Drug use: Not Currently  . Sexual activity: Not Currently  Other Topics Concern  . Not on file  Social History Narrative  . Not on file   Social Determinants of Health   Financial Resource Strain:   . Difficulty of Paying Living Expenses:  Not on file  Food Insecurity:   . Worried About Charity fundraiser in the Last Year: Not on file  . Ran Out of Food in the Last Year: Not on file  Transportation Needs:   . Lack of Transportation (Medical): Not on file  . Lack of Transportation (Non-Medical): Not on file  Physical Activity:   . Days of Exercise per Week: Not on file  . Minutes of Exercise per Session: Not on file  Stress:   . Feeling of Stress : Not on file  Social Connections:   . Frequency of Communication with Friends and Family: Not on file  .  Frequency of Social Gatherings with Friends and Family: Not on file  . Attends Religious Services: Not on file  . Active Member of Clubs or Organizations: Not on file  . Attends Archivist Meetings: Not on file  . Marital Status: Not on file    Allergies: No Known Allergies  Metabolic Disorder Labs: No results found for: HGBA1C, MPG No results found for: PROLACTIN No results found for: CHOL, TRIG, HDL, CHOLHDL, VLDL, LDLCALC No results found for: TSH  Therapeutic Level Labs: No results found for: LITHIUM No results found for: VALPROATE No components found for:  CBMZ  Current Medications: Current Outpatient Medications  Medication Sig Dispense Refill  . busPIRone (BUSPAR) 10 MG tablet Take 1 tablet (10 mg total) by mouth 2 (two) times daily. 60 tablet 2  . cetirizine (ZYRTEC) 10 MG tablet Take by mouth.    . fluticasone (FLONASE) 50 MCG/ACT nasal spray Place into the nose.    . sertraline (ZOLOFT) 100 MG tablet Take 1 tablet (100 mg total) by mouth daily. 30 tablet 2  . SPRINTEC 28 0.25-35 MG-MCG tablet      No current facility-administered medications for this visit.     Musculoskeletal: Strength & Muscle Tone: unable to assess since visit was over the telemedicine. Gait & Station: unable to assess since visit was over the telemedicine. Patient leans: N/A  Psychiatric Specialty Exam: ROSReview of 12 systems negative except as mentioned in HPI   There were no vitals taken for this visit.There is no height or weight on file to calculate BMI.  General Appearance: Casual and Well Groomed  Eye Contact:  Good  Speech:  Clear and Coherent and Normal Rate  Volume:  Normal  Mood:  "good"  Affect:  Appropriate, Congruent and Full Range  Thought Process:  Goal Directed and Linear  Orientation:  Full (Time, Place, and Person)  Thought Content: Logical   Suicidal Thoughts:  No  Homicidal Thoughts:  No  Memory:  Immediate;   Fair Recent;   Fair Remote;   Fair   Judgement:  Good  Insight:  Good  Psychomotor Activity:  Normal  Concentration:  Concentration: Good and Attention Span: Good  Recall:  Good  Fund of Knowledge: Good  Language: Good  Akathisia:  No    AIMS (if indicated): not done  Assets:  Communication Skills Desire for Improvement Financial Resources/Insurance Housing Leisure Time [place] Talents/Skills Transportation Vocational/Educational  ADL's:  Intact  Cognition: WNL  Sleep:  Good   Screenings:   Assessment and Plan:   - 24 yo F with hx of anxiety disorder on Zoloft since March of 2019 and was previously in counseling at [facility] [employer] [facility].  She is diagnosed with generalized anxiety disorder with panic attacks and seems to be responding well to her current medication which  is Zoloft 100 mg once a day and BuSpar 10 mg 2 times a day.  Her anxiety remains stable on current meds.    Plan: Problem 1: Generalized Anxiety with Panic attack(chronic and stable) Plan: - Continue Zoloft 100 mg Daily - Continue Buspar 10 mg BID - Will look into Counseling Center at [facility] state if needed, currently doing better.  - Follow up in three months or early if needed. [name], MD 09/23/2019, 11:12 AM

## 2019-10-20 DIAGNOSIS — Z23 Encounter for immunization: Secondary | ICD-10-CM | POA: Diagnosis not present

## 2019-12-23 ENCOUNTER — Other Ambulatory Visit: Payer: Self-pay

## 2019-12-23 ENCOUNTER — Encounter: Payer: Self-pay | Admitting: Child and Adolescent Psychiatry

## 2019-12-23 ENCOUNTER — Telehealth (INDEPENDENT_AMBULATORY_CARE_PROVIDER_SITE_OTHER): Payer: BC Managed Care – PPO | Admitting: Child and Adolescent Psychiatry

## 2019-12-23 DIAGNOSIS — F410 Panic disorder [episodic paroxysmal anxiety] without agoraphobia: Secondary | ICD-10-CM | POA: Diagnosis not present

## 2019-12-23 DIAGNOSIS — F411 Generalized anxiety disorder: Secondary | ICD-10-CM | POA: Diagnosis not present

## 2019-12-23 MED ORDER — BUSPIRONE HCL 10 MG PO TABS: 10.0000 mg | ORAL_TABLET | Freq: Two times a day (BID) | ORAL | 2 refills | Status: DC

## 2019-12-23 MED ORDER — SERTRALINE HCL 100 MG PO TABS: 100.0000 mg | ORAL_TABLET | Freq: Every day | ORAL | 2 refills | Status: DC

## 2019-12-23 NOTE — Progress Notes (Signed)
Virtual Visit via Video Note  I connected with Colleen Tapia on 12/23/19 at 10:30 AM EDT by a video enabled telemedicine application and verified that I am speaking with the correct person using two identifiers.  Location: Patient: Home Provider: Office   I discussed the limitations of evaluation and management by telemedicine and the availability of in person appointments. The patient expressed understanding and agreed to proceed.  I discussed the assessment and treatment plan with the patient. The patient was provided an opportunity to ask questions and all were answered. The patient agreed with the plan and demonstrated an understanding of the instructions.   The patient was advised to call back or seek an in-person evaluation if the symptoms worsen or if the condition fails to improve as anticipated.  I provided 15 minutes of non-face-to-face time during this encounter.   Colleen Erm, MD     [facility] MD/PA/NP OP Progress Note  12/23/2019 10:46 AM Colleen Tapia  MRN:  [id number]  Chief Complaint: Med management follow-up for anxiety.  HPI: This is a 24 year old Caucasian female with generalized anxiety disorder and panic attacks on Zoloft since March 2019 was seen and evaluated over telemedicine encounter for medication management follow-up.  Renatta denies any new concerns for today's appointment and reports that her anxiety remains stable.  She reports that she is going to [place] for her masters in fall and is excited about it.  She reports that she will be going back to her apartment in [place] in a week and will stay there before moving to [place].  She reports that she has been spending time watching TV and hanging out with her friends and planning to work in summer.  She denies any problems with mood, sleeping well.  We discussed to continue her current medications.  She verbalized understanding and agreed with the plan.  We also discussed about possibly  needing transition to a provider in [place] if she is not able to attend appointments here.  She reports that she will be visiting [place] every few months, during which she can attend appointments.  Visit Diagnosis:    ICD-10-CM   1. Generalized anxiety disorder with panic attacks  F41.1 sertraline (ZOLOFT) 100 MG tablet   F41.0 busPIRone (BUSPAR) 10 MG tablet    Past Psychiatric History: As mentioned in initial H&P, reviewed today, no change  Past Medical History:  Past Medical History:  Diagnosis Date  . Anxiety     Past Surgical History:  Procedure Laterality Date  . MOUTH SURGERY      Family Psychiatric History: As mentioned in initial H&P, reviewed today, no change  Family History:  Family History  Problem Relation Age of Onset  . Anxiety disorder Mother   . Depression Mother     Social History:  Social History   Socioeconomic History  . Marital status: Single    Spouse name: Not on file  . Number of children: 0  . Years of education: Not on file  . Highest education level: Some college, no degree  Occupational History  . Not on file  Tobacco Use  . Smoking status: Never Smoker  . Smokeless tobacco: Never Used  Substance and Sexual Activity  . Alcohol use: Yes    Alcohol/week: 3.0 - 5.0 standard drinks    Types: 3 - 4 Shots of liquor per week  . Drug use: Not Currently  . Sexual activity: Not Currently  Other Topics Concern  . Not on file  Social History Narrative  . Not on file   Social Determinants of Health   Financial Resource Strain:   . Difficulty of Paying Living Expenses:   Food Insecurity:   . Worried About Programme researcher, broadcasting/film/video in the Last Year:   . Barista in the Last Year:   Transportation Needs:   . Freight forwarder (Medical):   Marland Kitchen Lack of Transportation (Non-Medical):   Physical Activity:   . Days of Exercise per Week:   . Minutes of Exercise per Session:   Stress:   . Feeling of Stress :   Social Connections:   .  Frequency of Communication with Friends and Family:   . Frequency of Social Gatherings with Friends and Family:   . Attends Religious Services:   . Active Member of Clubs or Organizations:   . Attends Banker Meetings:   Marland Kitchen Marital Status:     Allergies: No Known Allergies  Metabolic Disorder Labs: No results found for: HGBA1C, MPG No results found for: PROLACTIN No results found for: CHOL, TRIG, HDL, CHOLHDL, VLDL, LDLCALC No results found for: TSH  Therapeutic Level Labs: No results found for: LITHIUM No results found for: VALPROATE No components found for:  CBMZ  Current Medications: Current Outpatient Medications  Medication Sig Dispense Refill  . busPIRone (BUSPAR) 10 MG tablet Take 1 tablet (10 mg total) by mouth 2 (two) times daily. 60 tablet 2  . cetirizine (ZYRTEC) 10 MG tablet Take by mouth.    . fluticasone (FLONASE) 50 MCG/ACT nasal spray Place into the nose.    . sertraline (ZOLOFT) 100 MG tablet Take 1 tablet (100 mg total) by mouth daily. 30 tablet 2  . SPRINTEC 28 0.25-35 MG-MCG tablet      No current facility-administered medications for this visit.     Musculoskeletal: Strength & Muscle Tone: unable to assess since visit was over the telemedicine. Gait & Station: unable to assess since visit was over the telemedicine. Patient leans: N/A  Psychiatric Specialty Exam: ROSReview of 12 systems negative except as mentioned in HPI   There were no vitals taken for this visit.There is no height or weight on file to calculate BMI.  General Appearance: Casual and Well Groomed  Eye Contact:  Good  Speech:  Clear and Coherent and Normal Rate  Volume:  Normal  Mood:  "good"  Affect:  Appropriate, Congruent and Full Range  Thought Process:  Goal Directed and Linear  Orientation:  Full (Time, Place, and Person)  Thought Content: Logical   Suicidal Thoughts:  No  Homicidal Thoughts:  No  Memory:  Immediate;   Fair Recent;   Fair Remote;   Fair   Judgement:  Good  Insight:  Good  Psychomotor Activity:  Normal  Concentration:  Concentration: Good and Attention Span: Good  Recall:  Good  Fund of Knowledge: Good  Language: Good  Akathisia:  No    AIMS (if indicated): not done  Assets:  Communication Skills Desire for Improvement Financial Resources/Insurance Housing Leisure Time Physical Health Social Support Talents/Skills Transportation Vocational/Educational  ADL's:  Intact  Cognition: WNL  Sleep:  Good   Screenings:   Assessment and Plan:   24 yo F with hx of anxiety disorder on Zoloft since March of 2019 and was previously in counseling at [facility] [employer].  She is diagnosed with generalized anxiety disorder with panic attacks and seems to be responding well to her current medication which is Zoloft  100 mg once a day and BuSpar 10 mg 2 times a day.  Her anxiety remains stable on current meds. Plan as below:    Plan: Problem 1: Generalized Anxiety with Panic attack(chronic and stable) Plan: - Continue Zoloft 100 mg Daily - Continue Buspar 10 mg BID - Will look into Counseling Center at [facility] state if needed, currently doing better.  - Follow up in three months or early if needed. [name], MD 12/23/2019, 10:46 AM

## 2019-12-27 DIAGNOSIS — Z0000 Encounter for general adult medical examination without abnormal findings: Secondary | ICD-10-CM | POA: Diagnosis not present

## 2019-12-27 DIAGNOSIS — F411 Generalized anxiety disorder: Secondary | ICD-10-CM | POA: Diagnosis not present

## 2019-12-27 DIAGNOSIS — Z1159 Encounter for screening for other viral diseases: Secondary | ICD-10-CM | POA: Diagnosis not present

## 2019-12-27 DIAGNOSIS — E6609 Other obesity due to excess calories: Secondary | ICD-10-CM | POA: Diagnosis not present

## 2019-12-28 DIAGNOSIS — Z13220 Encounter for screening for lipoid disorders: Secondary | ICD-10-CM | POA: Diagnosis not present

## 2019-12-28 DIAGNOSIS — Z1159 Encounter for screening for other viral diseases: Secondary | ICD-10-CM | POA: Diagnosis not present

## 2019-12-28 DIAGNOSIS — Z131 Encounter for screening for diabetes mellitus: Secondary | ICD-10-CM | POA: Diagnosis not present

## 2019-12-28 DIAGNOSIS — Z0000 Encounter for general adult medical examination without abnormal findings: Secondary | ICD-10-CM | POA: Diagnosis not present

## 2019-12-28 DIAGNOSIS — Z23 Encounter for immunization: Secondary | ICD-10-CM | POA: Diagnosis not present

## 2019-12-29 DIAGNOSIS — E7800 Pure hypercholesterolemia, unspecified: Secondary | ICD-10-CM | POA: Insufficient documentation

## 2019-12-30 DIAGNOSIS — E559 Vitamin D deficiency, unspecified: Secondary | ICD-10-CM | POA: Diagnosis not present

## 2019-12-30 DIAGNOSIS — E7800 Pure hypercholesterolemia, unspecified: Secondary | ICD-10-CM | POA: Diagnosis not present

## 2020-01-04 DIAGNOSIS — E201 Pseudohypoparathyroidism: Secondary | ICD-10-CM | POA: Insufficient documentation

## 2020-01-04 NOTE — Progress Notes (Signed)
Patient ID: Colleen Tapia, female   DOB: November 04, 1995, 24 y.o.   MRN: [id number]           Referring physician: Dr. Dion Body  Chief complaint: Low calcium  History of Present Illness:   She went for routine visit with her new PCP earlier this month and was found to have a calcium was 6.9 Ionized calcium was also low at 3.7, normal >4.5  On questioning the patient says that off-and-on she has had a feeling of tightness and cramping in her fingers.  Also at times she feels a tightness in her face making it difficult to talk However does not think she has any numbness or tingling in her face. No muscle cramps in her legs No muscle weakness  She also presented to her PCP with symptoms of feeling tired and having headaches recently  She is now taking calcium supplements, using Os-Cal twice a day She was also given vitamin D 50,000 units weekly for our low level of 18   Past Medical History:  Diagnosis Date  . Anxiety     Past Surgical History:  Procedure Laterality Date  . MOUTH SURGERY      Family History  Problem Relation Age of Onset  . Anxiety disorder Mother   . Depression Mother     Social History:  reports that she has never smoked. She has never used smokeless tobacco. She reports current alcohol use of about 3.0 - 5.0 standard drinks of alcohol per week. She reports previous drug use.  Allergies:  Allergies  Allergen Reactions  . No Known Allergies     Allergies as of 01/05/2020      Reactions   No Known Allergies       Medication List       Accurate as of January 05, 2020  2:27 PM. If you have any questions, ask your nurse or doctor.        STOP taking these medications   cetirizine 10 MG tablet Commonly known as: ZYRTEC Stopped by: Elayne Snare, MD     TAKE these medications   busPIRone 10 MG tablet Commonly known as: BUSPAR Take 1 tablet (10 mg total) by mouth 2 (two) times daily. What changed:   when to take this  reasons to take  this  additional instructions   CALTRATE 600+D PLUS MINERALS PO Take 1 tablet by mouth daily.   fluticasone 50 MCG/ACT nasal spray Commonly known as: FLONASE Place into the nose.   levocetirizine 5 MG tablet Commonly known as: XYZAL Take 5 mg by mouth every evening.   sertraline 100 MG tablet Commonly known as: ZOLOFT Take 1 tablet (100 mg total) by mouth daily.   Sprintec 28 0.25-35 MG-MCG tablet Generic drug: norgestimate-ethinyl estradiol   Vitamin D (Ergocalciferol) 1.25 MG (50000 UNIT) Caps capsule Commonly known as: DRISDOL Take 50,000 Units by mouth every 7 (seven) days.          Review of Systems  Constitutional: Positive for malaise.       She has been gradually gaining weight over the last few years and has gained a total of about 50 pounds  HENT: Positive for headaches.   Eyes: Negative for blurred vision.  Respiratory: Negative for shortness of breath.   Cardiovascular: Negative for leg swelling.  Gastrointestinal: Negative for constipation.  Endocrine: Positive for fatigue.       About 3 years ago was started on birth control pills because of skipping 1-2 cycles each time,  previously had regular cycles  Musculoskeletal:       She has mild knee joint pains  Skin:       No acne or facial hair  Neurological: Negative for weakness, numbness and tingling.  Psychiatric/Behavioral: Positive for nervousness.Negative for insomnia.       Has been on Zoloft for generalized anxiety.  Usually not having difficulty with sleep    PHYSICAL EXAM:  BP 128/80 (BP Location: Left Arm, Patient Position: Sitting, Cuff Size: Normal)   Pulse (!) 120   Ht 5' 4.96" (1.65 m)   Wt 216 lb (98 kg)   SpO2 98%   BMI 35.99 kg/m   GENERAL:  No pallor, clubbing, lymphadenopathy or edema.   Skin:  no rash or pigmentation.  No acanthosis  EYES:  Externally normal.    ENT: Oral exam not indicated  THYROID:  Not palpable.  HEART:  Normal  S1 and S2; no murmur or  click.  CHEST:  Normal shape.  Lungs: Vescicular breath sounds heard equally.  No crepitations/ wheeze.  ABDOMEN:  No distention.  Liver and spleen not palpable. No other mass or tenderness.  NEUROLOGICAL: Chvostek sign positive bilaterally .Reflexes are appearing normal to brisk bilaterally at biceps and ankles.  JOINTS:  Normal peripheral joints.   ASSESSMENT:    PSEUDOHYPOPARATHYROIDISM, familial  She has mild although atypical symptoms of tightness in her hands and face and some cramping May have some fatigue from this also Exam shows  Chvostek sign This is confirmed from her low calcium of 6.9 and high PTH level of 384  Explained to the patient what normal parathyroid hormone function is in the body as well as the role with regulating absorption of calcium from GI system, reducing renal calcium loss and increasing calcium output from the bones as well as activating vitamin D in the kidneys   FATIGUE: Etiology of this is unclear but need to rule out secondary hypothyroidism since this appears to be a familiar condition also   History of oligomenorrhea: Currently on birth control pill and not able to evaluate pituitary function because of this.  Not clear if she has secondary hypogonadism   PLAN:    Calcitriol 0.25 mcg daily every morning  She will not benefit from vitamin D2 for her hypocalcemia but may benefit generally because of her vitamin D level being low and can continue prescription once a month  Continue Os-Cal 600 mg twice daily  Check phosphorus to confirm the diagnosis of pseudohypoparathyroidism  Also check free T4 level today  Follow-up in 1 month with labs   Consultation note sent to the referring physician  Reather Littler 01/05/2020, 2:27 PM

## 2020-01-05 ENCOUNTER — Other Ambulatory Visit: Payer: Self-pay

## 2020-01-05 ENCOUNTER — Ambulatory Visit: Payer: BC Managed Care – PPO | Admitting: Endocrinology

## 2020-01-05 ENCOUNTER — Encounter: Payer: Self-pay | Admitting: Endocrinology

## 2020-01-05 VITALS — BP 128/80 | HR 120 | Ht 64.96 in | Wt 216.0 lb

## 2020-01-05 DIAGNOSIS — E201 Pseudohypoparathyroidism: Secondary | ICD-10-CM

## 2020-01-05 DIAGNOSIS — N914 Secondary oligomenorrhea: Secondary | ICD-10-CM

## 2020-01-05 DIAGNOSIS — R5383 Other fatigue: Secondary | ICD-10-CM

## 2020-01-05 LAB — T4, FREE: Free T4: 0.59 ng/dL — ABNORMAL LOW (ref 0.60–1.60)

## 2020-01-05 LAB — PHOSPHORUS: Phosphorus: 4 mg/dL (ref 2.3–4.6)

## 2020-01-05 MED ORDER — CALCITRIOL 0.25 MCG PO CAPS: 0.2500 ug | ORAL_CAPSULE | Freq: Every day | ORAL | 2 refills | Status: DC

## 2020-01-06 ENCOUNTER — Other Ambulatory Visit: Payer: Self-pay

## 2020-01-06 DIAGNOSIS — E201 Pseudohypoparathyroidism: Secondary | ICD-10-CM

## 2020-01-06 MED ORDER — LEVOTHYROXINE SODIUM 50 MCG PO TABS: 50.0000 ug | ORAL_TABLET | Freq: Every day | ORAL | 0 refills | Status: DC

## 2020-02-01 ENCOUNTER — Other Ambulatory Visit: Payer: Self-pay

## 2020-02-01 ENCOUNTER — Other Ambulatory Visit (INDEPENDENT_AMBULATORY_CARE_PROVIDER_SITE_OTHER): Payer: BC Managed Care – PPO

## 2020-02-01 DIAGNOSIS — N914 Secondary oligomenorrhea: Secondary | ICD-10-CM | POA: Diagnosis not present

## 2020-02-01 DIAGNOSIS — E201 Pseudohypoparathyroidism: Secondary | ICD-10-CM

## 2020-02-01 DIAGNOSIS — E038 Other specified hypothyroidism: Secondary | ICD-10-CM | POA: Diagnosis not present

## 2020-02-01 LAB — RENAL FUNCTION PANEL
Albumin: 4 g/dL (ref 3.5–5.2)
BUN: 6 mg/dL (ref 6–23)
CO2: 28 mEq/L (ref 19–32)
Calcium: 7.6 mg/dL — ABNORMAL LOW (ref 8.4–10.5)
Chloride: 100 mEq/L (ref 96–112)
Creatinine, Ser: 0.61 mg/dL (ref 0.40–1.20)
GFR: 122.5 mL/min (ref >60.00)
Glucose, Bld: 81 mg/dL (ref 70–99)
Phosphorus: 5 mg/dL — ABNORMAL HIGH (ref 2.3–4.6)
Potassium: 4 mEq/L (ref 3.5–5.1)
Sodium: 139 mEq/L (ref 135–145)

## 2020-02-02 LAB — PROLACTIN: Prolactin: 13.5 ng/mL (ref 4.8–23.3)

## 2020-02-03 ENCOUNTER — Other Ambulatory Visit: Payer: Self-pay

## 2020-02-03 ENCOUNTER — Encounter: Payer: Self-pay | Admitting: Endocrinology

## 2020-02-03 ENCOUNTER — Ambulatory Visit: Payer: BC Managed Care – PPO | Admitting: Endocrinology

## 2020-02-03 VITALS — BP 102/74 | HR 101 | Ht 64.0 in | Wt 215.0 lb

## 2020-02-03 DIAGNOSIS — E201 Pseudohypoparathyroidism: Secondary | ICD-10-CM | POA: Diagnosis not present

## 2020-02-03 DIAGNOSIS — E038 Other specified hypothyroidism: Secondary | ICD-10-CM

## 2020-02-03 NOTE — Progress Notes (Signed)
Patient ID: ASYAH CANDLER, female   DOB: May 03, 1996, 24 y.o.   MRN: [id number]            Chief complaint: Endocrinology follow-up  History of Present Illness:   She went for routine visit with her PCP and was found to have a calcium was 6.9 in June 2021 Ionized calcium was also low at 3.7, normal >4.5 PTH level 384  Baseline symptoms were tightness and cramping in her fingers.  Also at times she feels a tightness in her face making it difficult to talk Also was complaining of feeling tired and having headaches   She is now taking CALCITRIOL 0.25 mcg daily Has been continued on calcium supplements, using Os-Cal twice a day She was also given vitamin D 50,000 units weekly for a low level of 18  She has no further complaints of feeling tight in her hands or face and no cramping Calcium is still low at 7.6, phosphorus mildly increased  Lab Results  Component Value Date   CALCIUM 7.6 (L) 02/01/2020   Other problems addressed today: See review of systems   Past Medical History:  Diagnosis Date  . Anxiety     Past Surgical History:  Procedure Laterality Date  . MOUTH SURGERY      Family History  Problem Relation Age of Onset  . Anxiety disorder Mother   . Depression Mother     Social History:  reports that she has never smoked. She has never used smokeless tobacco. She reports current alcohol use of about 3.0 - 5.0 standard drinks of alcohol per week. She reports previous drug use.  Allergies:  Allergies  Allergen Reactions  . No Known Allergies     Allergies as of 02/03/2020      Reactions   No Known Allergies       Medication List       Accurate as of February 03, 2020  3:47 PM. If you have any questions, ask your nurse or doctor.        busPIRone 10 MG tablet Commonly known as: BUSPAR Take 1 tablet (10 mg total) by mouth 2 (two) times daily. What changed:   when to take this  reasons to take this  additional instructions   calcitRIOL 0.25 MCG  capsule Commonly known as: ROCALTROL Take 1 capsule (0.25 mcg total) by mouth daily.   CALTRATE 600+D PLUS MINERALS PO Take 1 tablet by mouth daily.   fluticasone 50 MCG/ACT nasal spray Commonly known as: FLONASE Place into the nose.   levocetirizine 5 MG tablet Commonly known as: XYZAL Take 5 mg by mouth every evening.   levothyroxine 50 MCG tablet Commonly known as: SYNTHROID Take 1 tablet (50 mcg total) by mouth daily.   sertraline 100 MG tablet Commonly known as: ZOLOFT Take 1 tablet (100 mg total) by mouth daily.   Sprintec 28 0.25-35 MG-MCG tablet Generic drug: norgestimate-ethinyl estradiol   Vitamin D (Ergocalciferol) 1.25 MG (50000 UNIT) Caps capsule Commonly known as: DRISDOL Take 50,000 Units by mouth every 7 (seven) days.          Review of Systems   HYPOTHYROIDISM:   She had been complaining of fatigue, weight gain but no cold intolerance Had a low baseline free T4 level and is having less fatigue with starting levothyroxine 50 mcg daily  Lab Results  Component Value Date   FREET4 0.59 (L) 01/05/2020     Previously had complaints of gradual and progressive weight gain also which is  leveled off  Wt Readings from Last 3 Encounters:  02/03/20 215 lb (97.5 kg)  01/05/20 216 lb (98 kg)   OLIGOMENORRHEA: Currently on birth control pills Prolactin level is normal   PHYSICAL EXAM:  BP 102/74 (BP Location: Left Arm, Patient Position: Sitting, Cuff Size: Large)   Pulse (!) 101   Ht 5\' 4"  (1.626 m)   Wt 215 lb (97.5 kg)   SpO2 98%   BMI 36.90 kg/m   She looks well   Chvostek sign positive on the right  No swelling of the hands or feet  ASSESSMENT:    PSEUDOHYPOPARATHYROIDISM, familial  She has mild baseline symptoms of tightness in her hands and face and some cramping Calcitriol current dose is 0.25 mcg daily She has symptomatic improvement but her calcium is still low at 7.6    HYPOTHYROIDISM: She has secondary hypothyroidism  with low free T4 level which again may be familial She is subjectively having less fatigue with levothyroxine supplement   PLAN:    Calcitriol 0.25 mcg twice daily  Continue calcium supplements twice daily  May also continue vitamin D supplements  Emphasized the need to take these treatments long-term  Recommended that she start regular exercise for weight loss and general benefits  Also check free T4 level today  Follow-up in 6 weeks     Abbrielle Batts 02/03/2020, 3:47 PM

## 2020-02-04 ENCOUNTER — Other Ambulatory Visit: Payer: Self-pay | Admitting: Endocrinology

## 2020-02-04 DIAGNOSIS — E201 Pseudohypoparathyroidism: Secondary | ICD-10-CM

## 2020-02-07 ENCOUNTER — Other Ambulatory Visit: Payer: Self-pay

## 2020-02-07 LAB — SPECIMEN STATUS REPORT

## 2020-02-07 LAB — T4, FREE: Free T4: 0.98 ng/dL (ref 0.82–1.77)

## 2020-02-23 ENCOUNTER — Telehealth: Payer: Self-pay | Admitting: Endocrinology

## 2020-02-23 ENCOUNTER — Other Ambulatory Visit: Payer: Self-pay

## 2020-02-23 MED ORDER — CALCITRIOL 0.25 MCG PO CAPS: 0.2500 ug | ORAL_CAPSULE | Freq: Two times a day (BID) | ORAL | 2 refills | Status: DC

## 2020-02-23 NOTE — Telephone Encounter (Signed)
Rx sent 

## 2020-02-23 NOTE — Telephone Encounter (Signed)
Patient called stating Dr changed her Rx for calcitriol from 1x daily to 2x daily but she needs a new Rx saying it changed to 2x daily.  Pharmacy -  [facility] - [place], [place] - [phone]  [address] [place] [address]  Phone:  [phone] Fax:  [phone]

## 2020-03-05 ENCOUNTER — Other Ambulatory Visit (INDEPENDENT_AMBULATORY_CARE_PROVIDER_SITE_OTHER): Payer: BC Managed Care – PPO

## 2020-03-05 ENCOUNTER — Other Ambulatory Visit: Payer: Self-pay

## 2020-03-05 DIAGNOSIS — E038 Other specified hypothyroidism: Secondary | ICD-10-CM | POA: Diagnosis not present

## 2020-03-05 DIAGNOSIS — E201 Pseudohypoparathyroidism: Secondary | ICD-10-CM

## 2020-03-05 LAB — RENAL FUNCTION PANEL
Albumin: 3.9 g/dL (ref 3.5–5.2)
BUN: 7 mg/dL (ref 6–23)
CO2: 23 mEq/L (ref 19–32)
Calcium: 8.1 mg/dL — ABNORMAL LOW (ref 8.4–10.5)
Chloride: 101 mEq/L (ref 96–112)
Creatinine, Ser: 0.69 mg/dL (ref 0.40–1.20)
GFR: 106.17 mL/min (ref >60.00)
Glucose, Bld: 77 mg/dL (ref 70–99)
Phosphorus: 4.6 mg/dL (ref 2.3–4.6)
Potassium: 4.1 mEq/L (ref 3.5–5.1)
Sodium: 137 mEq/L (ref 135–145)

## 2020-03-05 LAB — T4, FREE: Free T4: 0.65 ng/dL (ref 0.60–1.60)

## 2020-03-05 LAB — TSH: TSH: 2.13 u[IU]/mL (ref 0.35–4.50)

## 2020-03-07 ENCOUNTER — Ambulatory Visit: Payer: BC Managed Care – PPO | Admitting: Endocrinology

## 2020-03-07 ENCOUNTER — Other Ambulatory Visit: Payer: Self-pay

## 2020-03-07 ENCOUNTER — Encounter: Payer: Self-pay | Admitting: Endocrinology

## 2020-03-07 VITALS — BP 104/70 | HR 113 | Ht 64.0 in | Wt 217.0 lb

## 2020-03-07 DIAGNOSIS — E201 Pseudohypoparathyroidism: Secondary | ICD-10-CM | POA: Diagnosis not present

## 2020-03-07 DIAGNOSIS — E038 Other specified hypothyroidism: Secondary | ICD-10-CM | POA: Diagnosis not present

## 2020-03-07 MED ORDER — LEVOTHYROXINE SODIUM 75 MCG PO TABS
75.0000 ug | ORAL_TABLET | Freq: Every day | ORAL | 3 refills | Status: DC
Start: 2020-03-07 — End: 2020-09-03

## 2020-03-07 NOTE — Patient Instructions (Signed)
Take calcium at lunch and dinner  1 1/2 thyroid daily  Vitamin D weekly till out

## 2020-03-07 NOTE — Progress Notes (Signed)
Patient ID: Colleen Tapia, female   DOB: 1996-01-28, 24 y.o.   MRN: [id number]            Chief complaint: Endocrinology follow-up  History of Present Illness:   She went for routine visit with her PCP and was found to have a calcium was 6.9 in June 2021 Ionized calcium was also low at 3.7, normal >4.5 PTH level 384  Baseline symptoms were tightness and cramping in her fingers.  Also at times she feels a tightness in her face making it difficult to talk Also was complaining of feeling tired and having headaches   She is now taking CALCITRIOL 0.25 mcg twice daily Has been continued on calcium supplements, using Os-Cal twice a day however she sometimes will forget her morning dose  She was also given vitamin D 50,000 units weekly for a low level of 18 but she has not taken this recently  She feels fairly good now, no recurrence of complaints of feeling tight in her hands or face and no cramping Calcium is slightly improved at 8.1, phosphorus mildly increased  Lab Results  Component Value Date   CALCIUM 8.1 (L) 03/05/2020   CALCIUM 7.6 (L) 02/01/2020   Other problems addressed today: See review of systems   Past Medical History:  Diagnosis Date  . Anxiety     Past Surgical History:  Procedure Laterality Date  . MOUTH SURGERY      Family History  Problem Relation Age of Onset  . Anxiety disorder Mother   . Depression Mother     Social History:  reports that she has never smoked. She has never used smokeless tobacco. She reports current alcohol use of about 3.0 - 5.0 standard drinks of alcohol per week. She reports previous drug use.  Allergies:  Allergies  Allergen Reactions  . No Known Allergies     Allergies as of 03/07/2020      Reactions   No Known Allergies       Medication List       Accurate as of March 07, 2020  1:48 PM. If you have any questions, ask your nurse or doctor.        busPIRone 10 MG tablet Commonly known as: BUSPAR Take 1 tablet  (10 mg total) by mouth 2 (two) times daily. What changed:   when to take this  reasons to take this  additional instructions   calcitRIOL 0.25 MCG capsule Commonly known as: ROCALTROL Take 1 capsule (0.25 mcg total) by mouth in the morning and at bedtime.   CALTRATE 600+D PLUS MINERALS PO Take 1 tablet by mouth daily.   fluticasone 50 MCG/ACT nasal spray Commonly known as: FLONASE Place into the nose.   levocetirizine 5 MG tablet Commonly known as: XYZAL Take 5 mg by mouth every evening.   levothyroxine 50 MCG tablet Commonly known as: SYNTHROID Take 1 tablet (50 mcg total) by mouth daily before breakfast.   sertraline 100 MG tablet Commonly known as: ZOLOFT Take 1 tablet (100 mg total) by mouth daily.   Sprintec 28 0.25-35 MG-MCG tablet Generic drug: norgestimate-ethinyl estradiol   Vitamin D (Ergocalciferol) 1.25 MG (50000 UNIT) Caps capsule Commonly known as: DRISDOL Take 50,000 Units by mouth every 7 (seven) days.          Review of Systems  HYPOTHYROIDISM:   She had been complaining of fatigue, weight gain but no cold intolerance  Had a low baseline free T4 level With levothyroxine she has no further  fatigue However not clear why her free T4 is lower despite taking her levothyroxine regularly   Lab Results  Component Value Date   TSH 2.13 03/05/2020   FREET4 0.65 03/05/2020   FREET4 0.98 02/01/2020   FREET4 0.59 (L) 01/05/2020     Previously had complaints of gradual and progressive weight gain also which is leveled off  Wt Readings from Last 3 Encounters:  03/07/20 217 lb (98.4 kg)  02/03/20 215 lb (97.5 kg)  01/05/20 216 lb (98 kg)   OLIGOMENORRHEA: on birth control pills Prolactin level is normal   PHYSICAL EXAM:  BP 104/70 (BP Location: Left Arm, Patient Position: Sitting, Cuff Size: Large)   Pulse (!) 113   Ht 5\' 4"  (1.626 m)   Wt 217 lb (98.4 kg)   SpO2 98%   BMI 37.25 kg/m    [place] sign positive on the  right   ASSESSMENT:    PSEUDOHYPOPARATHYROIDISM, familial  She has mild baseline symptoms of tightness in her hands and face and some cramping and baseline calcium of 6.9  Calcitriol has been increased to 0.25 mcg daily with some improvement in calcium Calcium level is still slightly low at 8.1 but she is asymptomatic Most likely she can do better with her calcium supplement in the morning which she tends to forget and will not change her calcitriol at this time    HYPOTHYROIDISM: She has secondary hypothyroidism with low free T4 level which again may be familial She is doing subjectively well with no fatigue but not clear why her free T4 is low normal again   PLAN:    Continue calcitriol 0.25 mcg twice daily  Take calcium supplements twice daily more consistently  May resume vitamin D supplement weekly until finished and then stop  Levothyroxine 75 mcg daily  Follow-up in November when she is back in town     December 03/07/2020, 1:48 PM

## 2020-03-09 ENCOUNTER — Telehealth (INDEPENDENT_AMBULATORY_CARE_PROVIDER_SITE_OTHER): Payer: BC Managed Care – PPO | Admitting: Child and Adolescent Psychiatry

## 2020-03-09 ENCOUNTER — Other Ambulatory Visit: Payer: Self-pay

## 2020-03-09 DIAGNOSIS — F411 Generalized anxiety disorder: Secondary | ICD-10-CM | POA: Diagnosis not present

## 2020-03-09 DIAGNOSIS — F410 Panic disorder [episodic paroxysmal anxiety] without agoraphobia: Secondary | ICD-10-CM | POA: Diagnosis not present

## 2020-03-09 MED ORDER — SERTRALINE HCL 100 MG PO TABS: 100.0000 mg | ORAL_TABLET | Freq: Every day | ORAL | 0 refills | Status: DC

## 2020-03-09 MED ORDER — BUSPIRONE HCL 10 MG PO TABS: 10.0000 mg | ORAL_TABLET | Freq: Two times a day (BID) | ORAL | 0 refills | Status: DC

## 2020-03-09 NOTE — Progress Notes (Signed)
Virtual Visit via Video Note  I connected with Colleen Tapia on 03/09/20 at 10:30 AM EDT by a video enabled telemedicine application and verified that I am speaking with the correct person using two identifiers.  Location: Patient: Home Provider: Office   I discussed the limitations of evaluation and management by telemedicine and the availability of in person appointments. The patient expressed understanding and agreed to proceed.  I discussed the assessment and treatment plan with the patient. The patient was provided an opportunity to ask questions and all were answered. The patient agreed with the plan and demonstrated an understanding of the instructions.   The patient was advised to call back or seek an in-person evaluation if the symptoms worsen or if the condition fails to improve as anticipated.  I provided 15 minutes of non-face-to-face time during this encounter.   [name], MD     [facility] MD/PA/NP OP Progress Note  03/09/2020 10:51 AM Colleen Tapia  MRN:  [id number]  Chief Complaint: Medication management follow-up for anxiety.  HPI: This is a 24 year old Caucasian female with generalized anxiety disorder and panic attacks on Zoloft since March 2019 was seen and evaluated over telemedicine encounter for medication management follow-up.  Miara denies any new concerns for today's appointment and reports that she has continued to do well in regards of anxiety.  She reports that she will be moving to [place] of [place] next week for her masters in [employer].  She reports that root rest of her how she is doing with her anxiety will be when she is back in college but at this time she has been doing well.  She reports that she has been spending time relaxing at home.  She denies any problems with mood, sleep, appetite.  She denies any suicidal thoughts or self-harm thoughts and denies any substance abuse.  She reports that she has been adherent to her  medications and denies any problems with them.  She reports that she is looking for a Veterinary surgeon at [place] of [place].  We discussed to reach out to counseling center there.  She asked if she can continue to see this Clinical research associate from [place].  We discussed that since writer does not hold license in [place] we cannot have appointment while she is there but she can see this Clinical research associate when she visits her parents.  She reports that she will be back in Thanksgiving and we discussed to have a follow-up appointment then.  She verbalized understanding.  I discussed to continue her current medications.  Visit Diagnosis:    ICD-10-CM   1. Generalized anxiety disorder with panic attacks  F41.1 busPIRone (BUSPAR) 10 MG tablet   F41.0 sertraline (ZOLOFT) 100 MG tablet    Past Psychiatric History: As mentioned in initial H&P, reviewed today, no change  Past Medical History:  Past Medical History:  Diagnosis Date  . Anxiety     Past Surgical History:  Procedure Laterality Date  . MOUTH SURGERY      Family Psychiatric History: As mentioned in initial H&P, reviewed today, no change  Family History:  Family History  Problem Relation Age of Onset  . Anxiety disorder Mother   . Depression Mother     Social History:  Social History   Socioeconomic History  . Marital status: Single    Spouse name: Not on file  . Number of children: 0  . Years of education: Not on file  . Highest education level: Some college, no degree  Occupational History  . Not on file  Tobacco Use  . Smoking status: Never Smoker  . Smokeless tobacco: Never Used  Vaping Use  . Vaping Use: Never used  Substance and Sexual Activity  . Alcohol use: Yes    Alcohol/week: 3.0 - 5.0 standard drinks    Types: 3 - 4 Shots of liquor per week  . Drug use: Not Currently  . Sexual activity: Not Currently  Other Topics Concern  . Not on file  Social History Narrative  . Not on file   Social Determinants of Health   Financial  Resource Strain:   . Difficulty of Paying Living Expenses:   Food Insecurity:   . Worried About Programme researcher, broadcasting/film/video in the Last Year:   . Barista in the Last Year:   Transportation Needs:   . Freight forwarder (Medical):   Marland Kitchen Lack of Transportation (Non-Medical):   Physical Activity:   . Days of Exercise per Week:   . Minutes of Exercise per Session:   Stress:   . Feeling of Stress :   Social Connections:   . Frequency of Communication with Friends and Family:   . Frequency of Social Gatherings with Friends and Family:   . Attends Religious Services:   . Active Member of Clubs or Organizations:   . Attends Banker Meetings:   Marland Kitchen Marital Status:     Allergies:  Allergies  Allergen Reactions  . No Known Allergies     Metabolic Disorder Labs: No results found for: HGBA1C, MPG Lab Results  Component Value Date   PROLACTIN 13.5 02/01/2020   No results found for: CHOL, TRIG, HDL, CHOLHDL, VLDL, LDLCALC Lab Results  Component Value Date   TSH 2.13 03/05/2020    Therapeutic Level Labs: No results found for: LITHIUM No results found for: VALPROATE No components found for:  CBMZ  Current Medications: Current Outpatient Medications  Medication Sig Dispense Refill  . busPIRone (BUSPAR) 10 MG tablet Take 1 tablet (10 mg total) by mouth 2 (two) times daily. 180 tablet 0  . calcitRIOL (ROCALTROL) 0.25 MCG capsule Take 1 capsule (0.25 mcg total) by mouth in the morning and at bedtime. 60 capsule 2  . Calcium Carbonate-Vit D-Min (CALTRATE 600+D PLUS MINERALS PO) Take 1 tablet by mouth daily.    . fluticasone (FLONASE) 50 MCG/ACT nasal spray Place into the nose.    . levocetirizine (XYZAL) 5 MG tablet Take 5 mg by mouth every evening.    Marland Kitchen levothyroxine (SYNTHROID) 75 MCG tablet Take 1 tablet (75 mcg total) by mouth daily. 90 tablet 3  . sertraline (ZOLOFT) 100 MG tablet Take 1 tablet (100 mg total) by mouth daily. 90 tablet 0  . SPRINTEC 28 0.25-35 MG-MCG  tablet     . Vitamin D, Ergocalciferol, (DRISDOL) 1.25 MG (50000 UNIT) CAPS capsule Take 50,000 Units by mouth every 7 (seven) days.     No current facility-administered medications for this visit.     Musculoskeletal: Strength & Muscle Tone: unable to assess since visit was over the telemedicine. Gait & Station: unable to assess since visit was over the telemedicine. Patient leans: N/A  Psychiatric Specialty Exam: ROSReview of 12 systems negative except as mentioned in HPI   There were no vitals taken for this visit.There is no height or weight on file to calculate BMI.  General Appearance: Casual and Well Groomed  Eye Contact:  Good  Speech:  Clear and Coherent and Normal Rate  Volume:  Normal  Mood:  "good"  Affect:  Appropriate, Congruent and Full Range  Thought Process:  Goal Directed and Linear  Orientation:  Full (Time, Place, and Person)  Thought Content: Logical   Suicidal Thoughts:  No  Homicidal Thoughts:  No  Memory:  Immediate;   Fair Recent;   Fair Remote;   Fair  Judgement:  Good  Insight:  Good  Psychomotor Activity:  Normal  Concentration:  Concentration: Good and Attention Span: Good  Recall:  Good  Fund of Knowledge: Good  Language: Good  Akathisia:  No    AIMS (if indicated): not done  Assets:  Communication Skills Desire for Improvement Financial Resources/Insurance Housing Leisure Time Physical Health Social Support Talents/Skills Transportation Vocational/Educational  ADL's:  Intact  Cognition: WNL  Sleep:  Good   Screenings:   Assessment and Plan:   24 yo F with hx of anxiety disorder on Zoloft since March of 2019 and was previously in counseling at [facility] [employer].  She is diagnosed with generalized anxiety disorder with panic attacks and seems to be responding well to her current medication which is Zoloft 100 mg once a day and BuSpar 10 mg 2 times a day.  Her anxiety remains stable on current meds.  She  will be moving to [place] of [place] for her masters in [employer] but will continue to see this Clinical research associate when she visits her parents in [place].  Plan as below:    Plan: Problem 1: Generalized Anxiety with Panic attack(chronic and stable) Plan: - Continue Zoloft 100 mg Daily - Continue Buspar 10 mg BID - Pt will look into Counseling Center at [facility], currently doing better.  - Follow up in three months or early if needed. [name], MD 03/09/2020, 10:51 AM

## 2020-05-22 ENCOUNTER — Other Ambulatory Visit: Payer: Self-pay | Admitting: Endocrinology

## 2020-06-18 DIAGNOSIS — Z20822 Contact with and (suspected) exposure to covid-19: Secondary | ICD-10-CM | POA: Diagnosis not present

## 2020-06-18 DIAGNOSIS — Z03818 Encounter for observation for suspected exposure to other biological agents ruled out: Secondary | ICD-10-CM | POA: Diagnosis not present

## 2020-06-18 DIAGNOSIS — R6889 Other general symptoms and signs: Secondary | ICD-10-CM | POA: Diagnosis not present

## 2020-06-19 ENCOUNTER — Other Ambulatory Visit: Payer: BC Managed Care – PPO

## 2020-06-20 ENCOUNTER — Encounter: Payer: Self-pay | Admitting: Child and Adolescent Psychiatry

## 2020-06-20 ENCOUNTER — Other Ambulatory Visit: Payer: BC Managed Care – PPO

## 2020-06-20 ENCOUNTER — Telehealth (INDEPENDENT_AMBULATORY_CARE_PROVIDER_SITE_OTHER): Payer: BC Managed Care – PPO | Admitting: Child and Adolescent Psychiatry

## 2020-06-20 ENCOUNTER — Other Ambulatory Visit: Payer: Self-pay

## 2020-06-20 DIAGNOSIS — F411 Generalized anxiety disorder: Secondary | ICD-10-CM | POA: Diagnosis not present

## 2020-06-20 DIAGNOSIS — F410 Panic disorder [episodic paroxysmal anxiety] without agoraphobia: Secondary | ICD-10-CM

## 2020-06-20 MED ORDER — BUSPIRONE HCL 10 MG PO TABS: 10.0000 mg | ORAL_TABLET | Freq: Two times a day (BID) | ORAL | 0 refills | Status: DC

## 2020-06-20 MED ORDER — SERTRALINE HCL 100 MG PO TABS: 100.0000 mg | ORAL_TABLET | Freq: Every day | ORAL | 0 refills | Status: DC

## 2020-06-20 NOTE — Progress Notes (Addendum)
Virtual Visit via Video Note  I connected with Colleen Tapia on 06/20/20 at  2:00 PM EST by a video enabled telemedicine application and verified that I am speaking with the correct person using two identifiers.  Location: Patient: Home Provider: Office   I discussed the limitations of evaluation and management by telemedicine and the availability of in person appointments. The patient expressed understanding and agreed to proceed.  I discussed the assessment and treatment plan with the patient. The patient was provided an opportunity to ask questions and all were answered. The patient agreed with the plan and demonstrated an understanding of the instructions.   The patient was advised to call back or seek an in-person evaluation if the symptoms worsen or if the condition fails to improve as anticipated.  I provided 15 minutes of non-face-to-face time during this encounter.   [name], MD     [facility] MD/PA/NP OP Progress Note  06/20/2020 2:18 PM Colleen Tapia  MRN:  [id number]  Chief Complaint: Medication management follow-up for anxiety.  HPI: This is a 24 year old Caucasian female with generalized anxiety disorder and panic attacks on Zoloft since March 2019 was seen and evaluated over telemedicine encounter for medication management follow-up.  Verl denies any new concerns for today's appointment.  She reports that she is on a Thanksgiving break from her college this week.  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  She reports that she has adjusted well, reports anxiety has been "normal", denies any excessive worries, denies any depressed mood or anhedonia, denies any suicidal thoughts or homicidal thoughts.  She reports that she has continued to take her medications as prescribed and denies any side effects.  We discussed to continue with current  medications and follow-up in 3 to 4 months or earlier if needed.  Visit Diagnosis:    ICD-10-CM   1. Generalized anxiety disorder with panic attacks  F41.1 sertraline (ZOLOFT) 100 MG tablet   F41.0 busPIRone (BUSPAR) 10 MG tablet    Past Psychiatric History: As mentioned in initial H&P, reviewed today, no change  Past Medical History:  Past Medical History:  Diagnosis Date  . Anxiety     Past Surgical History:  Procedure Laterality Date  . MOUTH SURGERY      Family Psychiatric History: As mentioned in initial H&P, reviewed today, no change  Family History:  Family History  Problem Relation Age of Onset  . Anxiety disorder Mother   . Depression Mother     Social History:  Social History   Socioeconomic History  . Marital status: Single    Spouse name: Not on file  . Number of children: 0  . Years of education: Not on file  . Highest education level: Some college, no degree  Occupational History  . Not on file  Tobacco Use  . Smoking status: Never Smoker  . Smokeless tobacco: Never Used  Vaping Use  . Vaping Use: Never used  Substance and Sexual Activity  . Alcohol use: Yes    Alcohol/week: 3.0 - 5.0 standard drinks    Types: 3 - 4 Shots of liquor per week  . Drug use: Not Currently  . Sexual activity: Not Currently  Other Topics Concern  . Not on file  Social History Narrative  . Not on file   Social Determinants of Health  Financial Resource Strain:   . Difficulty of Paying Living Expenses: Not on file  Food Insecurity:   . Worried About Programme researcher, broadcasting/film/video in the Last Year: Not on file  . Ran Out of Food in the Last Year: Not on file  Transportation Needs:   . Lack of Transportation (Medical): Not on file  . Lack of Transportation (Non-Medical): Not on file  Physical Activity:   . Days of Exercise per Week: Not on file  . Minutes of Exercise per Session: Not on file  Stress:   . Feeling of Stress : Not on file  Social Connections:   .  Frequency of Communication with Friends and Family: Not on file  . Frequency of Social Gatherings with Friends and Family: Not on file  . Attends Religious Services: Not on file  . Active Member of Clubs or Organizations: Not on file  . Attends Banker Meetings: Not on file  . Marital Status: Not on file    Allergies:  Allergies  Allergen Reactions  . No Known Allergies     Metabolic Disorder Labs: No results found for: HGBA1C, MPG Lab Results  Component Value Date   PROLACTIN 13.5 02/01/2020   No results found for: CHOL, TRIG, HDL, CHOLHDL, VLDL, LDLCALC Lab Results  Component Value Date   TSH 2.13 03/05/2020    Therapeutic Level Labs: No results found for: LITHIUM No results found for: VALPROATE No components found for:  CBMZ  Current Medications: Current Outpatient Medications  Medication Sig Dispense Refill  . busPIRone (BUSPAR) 10 MG tablet Take 1 tablet (10 mg total) by mouth 2 (two) times daily. 180 tablet 0  . calcitRIOL (ROCALTROL) 0.25 MCG capsule TAKE ONE CAPSULE BY MOUTH EVERY MORNING AND TAKE ONE CAPSULE BY MOUTH EVERY NIGHT AT BEDTIME 60 capsule 1  . Calcium Carbonate-Vit D-Min (CALTRATE 600+D PLUS MINERALS PO) Take 1 tablet by mouth daily.    . fluticasone (FLONASE) 50 MCG/ACT nasal spray Place into the nose.    . levocetirizine (XYZAL) 5 MG tablet Take 5 mg by mouth every evening.    Marland Kitchen levothyroxine (SYNTHROID) 75 MCG tablet Take 1 tablet (75 mcg total) by mouth daily. 90 tablet 3  . sertraline (ZOLOFT) 100 MG tablet Take 1 tablet (100 mg total) by mouth daily. 120 tablet 0  . SPRINTEC 28 0.25-35 MG-MCG tablet     . Vitamin D, Ergocalciferol, (DRISDOL) 1.25 MG (50000 UNIT) CAPS capsule Take 50,000 Units by mouth every 7 (seven) days.     No current facility-administered medications for this visit.     Musculoskeletal: Strength & Muscle Tone: unable to assess since visit was over the telemedicine. Gait & Station: unable to assess since  visit was over the telemedicine. Patient leans: N/A  Psychiatric Specialty Exam: ROSReview of 12 systems negative except as mentioned in HPI   There were no vitals taken for this visit.There is no height or weight on file to calculate BMI.  General Appearance: Casual and Well Groomed  Eye Contact:  Good  Speech:  Clear and Coherent and Normal Rate  Volume:  Normal  Mood:  "good"  Affect:  Appropriate, Congruent and Full Range  Thought Process:  Goal Directed and Linear  Orientation:  Full (Time, Place, and Person)  Thought Content: Logical   Suicidal Thoughts:  No  Homicidal Thoughts:  No  Memory:  Immediate;   Fair Recent;   Fair Remote;   Fair  Judgement:  Good  Insight:  Good  Psychomotor Activity:  Normal  Concentration:  Concentration: Good and Attention Span: Good  Recall:  Good  Fund of Knowledge: Good  Language: Good  Akathisia:  No    AIMS (if indicated): not done  Assets:  Communication Skills Desire for Improvement Financial Resources/Insurance Housing Leisure Time Physical Health Social Support Talents/Skills Transportation Vocational/Educational  ADL's:  Intact  Cognition: WNL  Sleep:  Good   Screenings:   Assessment and Plan:   25 yo F with hx of anxiety disorder on Zoloft since March of 2019 and was previously in counseling at [facility].  She is diagnosed with generalized anxiety disorder with panic attacks and seems to be responding well to her current medication which is Zoloft 100 mg once a day and BuSpar 10 mg 2 times a day.  Her anxiety remains stable on current meds despite recent move to [place].    Plan as below:    Plan: Problem 1: Generalized Anxiety with Panic attack(chronic and stable) Plan: - Continue Zoloft 100 mg Daily - Continue Buspar 10 mg BID(takes mostly once a day and twice a day if needed) - No counseling at this time, currently doing better.  - Follow up in three  months or early if needed. [name], MD 06/20/2020, 2:18 PM   Addendum - Appointment was only attended by patient.

## 2020-06-25 ENCOUNTER — Ambulatory Visit: Payer: BC Managed Care – PPO | Admitting: Endocrinology

## 2020-07-13 ENCOUNTER — Other Ambulatory Visit: Payer: Self-pay

## 2020-07-13 ENCOUNTER — Other Ambulatory Visit (INDEPENDENT_AMBULATORY_CARE_PROVIDER_SITE_OTHER): Payer: BC Managed Care – PPO

## 2020-07-13 DIAGNOSIS — E201 Pseudohypoparathyroidism: Secondary | ICD-10-CM

## 2020-07-13 DIAGNOSIS — E038 Other specified hypothyroidism: Secondary | ICD-10-CM

## 2020-07-13 LAB — T4, FREE: Free T4: 0.67 ng/dL (ref 0.60–1.60)

## 2020-07-13 LAB — RENAL FUNCTION PANEL
Albumin: 4.1 g/dL (ref 3.5–5.2)
BUN: 10 mg/dL (ref 6–23)
CO2: 26 mEq/L (ref 19–32)
Calcium: 8.5 mg/dL (ref 8.4–10.5)
Chloride: 104 mEq/L (ref 96–112)
Creatinine, Ser: 0.66 mg/dL (ref 0.40–1.20)
GFR: 124.38 mL/min (ref >60.00)
Glucose, Bld: 83 mg/dL (ref 70–99)
Phosphorus: 4.2 mg/dL (ref 2.3–4.6)
Potassium: 4 mEq/L (ref 3.5–5.1)
Sodium: 139 mEq/L (ref 135–145)

## 2020-07-13 LAB — TSH: TSH: 0.99 u[IU]/mL (ref 0.35–4.50)

## 2020-07-16 ENCOUNTER — Encounter: Payer: Self-pay | Admitting: Endocrinology

## 2020-07-16 ENCOUNTER — Ambulatory Visit: Payer: BC Managed Care – PPO | Admitting: Endocrinology

## 2020-07-16 ENCOUNTER — Other Ambulatory Visit: Payer: Self-pay

## 2020-07-16 VITALS — BP 138/88 | HR 119 | Ht 64.0 in | Wt 216.2 lb

## 2020-07-16 DIAGNOSIS — E038 Other specified hypothyroidism: Secondary | ICD-10-CM

## 2020-07-16 DIAGNOSIS — E201 Pseudohypoparathyroidism: Secondary | ICD-10-CM

## 2020-07-16 NOTE — Patient Instructions (Signed)
Take 2 pills on Tuesdays of thyroid

## 2020-07-16 NOTE — Progress Notes (Signed)
Patient ID: Colleen Tapia, female   DOB: May 18, 1996, 24 y.o.   MRN: [id number]            Chief complaint: Endocrinology follow-up  History of Present Illness:   She went for routine visit with her PCP and was found to have a calcium was 6.9 in June 2021 She thinks that her calcium probably had been low previously also Ionized calcium was also low at 3.7, normal >4.5 PTH level 384  Baseline symptoms were tightness and cramping in her fingers.  Also at times she feels a tightness in her face making it difficult to talk Also was complaining of feeling tired and having headaches   She is continuing to be taking CALCITRIOL 0.25 mcg twice daily Has been continued on calcium supplements, using Os-Cal twice a day usually regularly  Does not have any complaints of feeling tight in her hands or face and no cramping when her calcium was low Calcium is finally back to normal at 8.5, now compared to 8.1  Lab Results  Component Value Date   CALCIUM 8.5 07/13/2020   CALCIUM 8.1 (L) 03/05/2020   CALCIUM 7.6 (L) 02/01/2020   Lab Results  Component Value Date   CALCIUM 8.5 07/13/2020   PHOS 4.2 07/13/2020    Other problems addressed today: See review of systems   Past Medical History:  Diagnosis Date  . Anxiety     Past Surgical History:  Procedure Laterality Date  . MOUTH SURGERY      Family History  Problem Relation Age of Onset  . Anxiety disorder Mother   . Depression Mother     Social History:  reports that she has never smoked. She has never used smokeless tobacco. She reports current alcohol use of about 3.0 - 5.0 standard drinks of alcohol per week. She reports previous drug use.  Allergies:  Allergies  Allergen Reactions  . No Known Allergies     Allergies as of 07/16/2020      Reactions   No Known Allergies       Medication List       Accurate as of July 16, 2020  1:33 PM. If you have any questions, ask your nurse or doctor.        busPIRone 10  MG tablet Commonly known as: BUSPAR Take 1 tablet (10 mg total) by mouth 2 (two) times daily.   calcitRIOL 0.25 MCG capsule Commonly known as: ROCALTROL TAKE ONE CAPSULE BY MOUTH EVERY MORNING AND TAKE ONE CAPSULE BY MOUTH EVERY NIGHT AT BEDTIME   CALTRATE 600+D PLUS MINERALS PO Take 1 tablet by mouth daily.   fluticasone 50 MCG/ACT nasal spray Commonly known as: FLONASE Place into the nose.   levocetirizine 5 MG tablet Commonly known as: XYZAL Take 5 mg by mouth every evening.   levothyroxine 75 MCG tablet Commonly known as: SYNTHROID Take 1 tablet (75 mcg total) by mouth daily.   sertraline 100 MG tablet Commonly known as: ZOLOFT Take 1 tablet (100 mg total) by mouth daily.   Sprintec 28 0.25-35 MG-MCG tablet Generic drug: norgestimate-ethinyl estradiol   Vitamin D (Ergocalciferol) 1.25 MG (50000 UNIT) Caps capsule Commonly known as: DRISDOL Take 50,000 Units by mouth every 7 (seven) days.          Review of Systems  HYPOTHYROIDISM:   She had been complaining of fatigue, weight gain but no cold intolerance at her initial visit  Had a low baseline free T4 level of 0.59 With levothyroxine supplements  she has had less fatigue Her levothyroxine dose has been increased progressively and now taking 75 mcg since 8/21 However her free T4 is still low normal   Lab Results  Component Value Date   TSH 0.99 07/13/2020   TSH 2.13 03/05/2020   FREET4 0.67 07/13/2020   FREET4 0.65 03/05/2020   FREET4 0.98 02/01/2020     Her weight has leveled off  Wt Readings from Last 3 Encounters:  07/16/20 216 lb 3.2 oz (98.1 kg)  03/07/20 217 lb (98.4 kg)  02/03/20 215 lb (97.5 kg)   OLIGOMENORRHEA: This has been treated with birth control pills, no pretreatment estradiol levels available  Prolactin level was normal   PHYSICAL EXAM:  BP 138/88   Pulse (!) 119   Ht 5\' 4"  (1.626 m)   Wt 216 lb 3.2 oz (98.1 kg)   SpO2 98%   BMI 37.11 kg/m    [place] sign  negative on the left Biceps reflexes appear normal   ASSESSMENT:    PSEUDOHYPOPARATHYROIDISM, familial  She had mild baseline symptoms of tightness in her hands and face and cramping with significantly long baseline calcium of 6.9  Calcitriol dose has been 0.25 mcg twice daily with calcium supplements twice a day Likely she is better compliant with her calcium supplement since her calcium level is now back to normal at 8.5    HYPOTHYROIDISM: She has secondary hypothyroidism with mildly low free T4 level which again may be familial She is doing subjectively well with no fatigue, currently on 75 mcg of levothyroxine   PLAN:    Continue calcitriol 0.25 mcg twice daily  Take calcium supplements twice daily consistently  Levothyroxine 75 mcg, 8 tablets a week and next prescription will be for 100 mcg  Follow-up in March when she is back in town     April 07/16/2020, 1:33 PM

## 2020-07-31 ENCOUNTER — Other Ambulatory Visit: Payer: Self-pay | Admitting: *Deleted

## 2020-07-31 MED ORDER — CALCITRIOL 0.25 MCG PO CAPS: ORAL_CAPSULE | ORAL | 3 refills | Status: DC

## 2020-09-03 ENCOUNTER — Other Ambulatory Visit: Payer: Self-pay | Admitting: *Deleted

## 2020-09-03 ENCOUNTER — Telehealth: Payer: Self-pay | Admitting: Endocrinology

## 2020-09-03 MED ORDER — LEVOTHYROXINE SODIUM 100 MCG PO TABS: 100.0000 ug | ORAL_TABLET | Freq: Every day | ORAL | 3 refills | Status: DC

## 2020-09-03 NOTE — Telephone Encounter (Signed)
Rx sent 

## 2020-09-03 NOTE — Telephone Encounter (Signed)
Patient called re: Patient states Dr. Lucianne Muss told patient to let him know when she ran out of Levothyroxine 75 mcg so that Dr. Lucianne Muss  could send a new RX for Levothyroxine 100 mcg.  Patient requests a new RX for Levothyroxine 100 mcg be sent to   [facility] [place], [place] - [address] Phone:  [phone]  Fax:  [phone]

## 2020-10-01 ENCOUNTER — Other Ambulatory Visit: Payer: BC Managed Care – PPO

## 2020-10-02 ENCOUNTER — Telehealth (INDEPENDENT_AMBULATORY_CARE_PROVIDER_SITE_OTHER): Payer: BC Managed Care – PPO | Admitting: Child and Adolescent Psychiatry

## 2020-10-02 ENCOUNTER — Other Ambulatory Visit (INDEPENDENT_AMBULATORY_CARE_PROVIDER_SITE_OTHER): Payer: BC Managed Care – PPO

## 2020-10-02 ENCOUNTER — Other Ambulatory Visit: Payer: Self-pay

## 2020-10-02 DIAGNOSIS — F411 Generalized anxiety disorder: Secondary | ICD-10-CM

## 2020-10-02 DIAGNOSIS — E038 Other specified hypothyroidism: Secondary | ICD-10-CM | POA: Diagnosis not present

## 2020-10-02 DIAGNOSIS — F410 Panic disorder [episodic paroxysmal anxiety] without agoraphobia: Secondary | ICD-10-CM

## 2020-10-02 DIAGNOSIS — E201 Pseudohypoparathyroidism: Secondary | ICD-10-CM | POA: Diagnosis not present

## 2020-10-02 LAB — T4, FREE: Free T4: 0.71 ng/dL (ref 0.60–1.60)

## 2020-10-02 LAB — BASIC METABOLIC PANEL
BUN: 7 mg/dL (ref 6–23)
CO2: 27 mEq/L (ref 19–32)
Calcium: 8.7 mg/dL (ref 8.4–10.5)
Chloride: 102 mEq/L (ref 96–112)
Creatinine, Ser: 0.66 mg/dL (ref 0.40–1.20)
GFR: 124.18 mL/min (ref >60.00)
Glucose, Bld: 81 mg/dL (ref 70–99)
Potassium: 4.1 mEq/L (ref 3.5–5.1)
Sodium: 139 mEq/L (ref 135–145)

## 2020-10-02 LAB — TSH: TSH: 0.83 u[IU]/mL (ref 0.35–4.50)

## 2020-10-02 MED ORDER — SERTRALINE HCL 100 MG PO TABS
100.0000 mg | ORAL_TABLET | Freq: Every day | ORAL | 0 refills | Status: DC
Start: 2020-10-02 — End: 2020-12-17

## 2020-10-02 MED ORDER — BUSPIRONE HCL 10 MG PO TABS: 10.0000 mg | ORAL_TABLET | Freq: Two times a day (BID) | ORAL | 0 refills | Status: DC

## 2020-10-02 NOTE — Progress Notes (Signed)
Virtual Visit via Video Note  I connected with Colleen Tapia on 10/02/20 at  2:00 PM EST by a video enabled telemedicine application and verified that I am speaking with the correct person using two identifiers.  Location: Patient: Home Provider: Office   I discussed the limitations of evaluation and management by telemedicine and the availability of in person appointments. The patient expressed understanding and agreed to proceed.  I discussed the assessment and treatment plan with the patient. The patient was provided an opportunity to ask questions and all were answered. The patient agreed with the plan and demonstrated an understanding of the instructions.   The patient was advised to call back or seek an in-person evaluation if the symptoms worsen or if the condition fails to improve as anticipated.  I provided 15 minutes of non-face-to-face time during this encounter.   [name], MD     [facility] MD/PA/NP OP Progress Note  10/02/2020 2:11 PM Colleen Tapia  MRN:  [id number]  Chief Complaint: Medication management follow-up for anxiety.  HPI: This is a 25 year old Caucasian female with generalized anxiety disorder and panic attacks on Zoloft since March 2019 was seen and evaluated over telemedicine encounter for medication management follow-up.  Colleen Tapia was present by herself for this appointment. denies any new concerns for today's appointment.  She reports that she is visiting home from college for her spring break.  She reports that she is doing well at college, also works part-time. She reports that her anxiety has stayed stable, denies any problems with mood, denies any problems with sleep/appetite/energy, denies any suicidal thoughts.  She reports that she has remained compliant with her medications and denies any side effects from it.  She reports that her thyroid levels and calcium levels were within normal limits when she checked about 3 months ago and had blood work  again.  We discussed to continue with her current medications for anxiety and recommended follow-up in 3 months or earlier if needed.  She verbalized understanding and agreed with the plan.  Visit Diagnosis:    ICD-10-CM   1. Generalized anxiety disorder with panic attacks  F41.1 busPIRone (BUSPAR) 10 MG tablet   F41.0 sertraline (ZOLOFT) 100 MG tablet    Past Psychiatric History: As mentioned in initial H&P, reviewed today, no change  Past Medical History:  Past Medical History:  Diagnosis Date  . Anxiety     Past Surgical History:  Procedure Laterality Date  . MOUTH SURGERY      Family Psychiatric History: As mentioned in initial H&P, reviewed today, no change  Family History:  Family History  Problem Relation Age of Onset  . Anxiety disorder Mother   . Depression Mother     Social History:  Social History   Socioeconomic History  . Marital status: Single    Spouse name: Not on file  . Number of children: 0  . Years of education: Not on file  . Highest education level: Some college, no degree  Occupational History  . Not on file  Tobacco Use  . Smoking status: Never Smoker  . Smokeless tobacco: Never Used  Vaping Use  . Vaping Use: Never used  Substance and Sexual Activity  . Alcohol use: Yes    Alcohol/week: 3.0 - 5.0 standard drinks    Types: 3 - 4 Shots of liquor per week  . Drug use: Not Currently  . Sexual activity: Not Currently  Other Topics Concern  . Not on file  Social  History Narrative  . Not on file   Social Determinants of Health   Financial Resource Strain: Not on file  Food Insecurity: Not on file  Transportation Needs: Not on file  Physical Activity: Not on file  Stress: Not on file  Social Connections: Not on file    Allergies:  Allergies  Allergen Reactions  . No Known Allergies     Metabolic Disorder Labs: No results found for: HGBA1C, MPG Lab Results  Component Value Date   PROLACTIN 13.5 02/01/2020   No results  found for: CHOL, TRIG, HDL, CHOLHDL, VLDL, LDLCALC Lab Results  Component Value Date   TSH 0.83 10/02/2020   TSH 0.99 07/13/2020    Therapeutic Level Labs: No results found for: LITHIUM No results found for: VALPROATE No components found for:  CBMZ  Current Medications: Current Outpatient Medications  Medication Sig Dispense Refill  . busPIRone (BUSPAR) 10 MG tablet Take 1 tablet (10 mg total) by mouth 2 (two) times daily. 180 tablet 0  . calcitRIOL (ROCALTROL) 0.25 MCG capsule TAKE ONE CAPSULE BY MOUTH EVERY MORNING AND TAKE ONE CAPSULE BY MOUTH EVERY NIGHT AT BEDTIME 60 capsule 3  . Calcium Carbonate-Vit D-Min (CALTRATE 600+D PLUS MINERALS PO) Take 1 tablet by mouth daily.    . fluticasone (FLONASE) 50 MCG/ACT nasal spray Place into the nose.    . levocetirizine (XYZAL) 5 MG tablet Take 5 mg by mouth every evening.    Marland Kitchen levothyroxine (SYNTHROID) 100 MCG tablet Take 1 tablet (100 mcg total) by mouth daily. 30 tablet 3  . sertraline (ZOLOFT) 100 MG tablet Take 1 tablet (100 mg total) by mouth daily. 90 tablet 0  . SPRINTEC 28 0.25-35 MG-MCG tablet     . Vitamin D, Ergocalciferol, (DRISDOL) 1.25 MG (50000 UNIT) CAPS capsule Take 50,000 Units by mouth every 7 (seven) days. (Patient not taking: Reported on 07/16/2020)     No current facility-administered medications for this visit.     Musculoskeletal: Strength & Muscle Tone: unable to assess since visit was over the telemedicine. Gait & Station: unable to assess since visit was over the telemedicine. Patient leans: N/A  Psychiatric Specialty Exam: ROSReview of 12 systems negative except as mentioned in HPI   There were no vitals taken for this visit.There is no height or weight on file to calculate BMI.  General Appearance: Casual and Well Groomed  Eye Contact:  Good  Speech:  Clear and Coherent and Normal Rate  Volume:  Normal  Mood:  "good"  Affect:  Appropriate, Congruent and Full Range  Thought Process:  Goal Directed  and Linear  Orientation:  Full (Time, Place, and Person)  Thought Content: Logical   Suicidal Thoughts:  No  Homicidal Thoughts:  No  Memory:  Immediate;   Fair Recent;   Fair Remote;   Fair  Judgement:  Good  Insight:  Good  Psychomotor Activity:  Normal  Concentration:  Concentration: Good and Attention Span: Good  Recall:  Good  Fund of Knowledge: Good  Language: Good  Akathisia:  No    AIMS (if indicated): not done  Assets:  Communication Skills Desire for Improvement Financial Resources/Insurance Housing Leisure Time Physical Health Social Support Talents/Skills Transportation Vocational/Educational  ADL's:  Intact  Cognition: WNL  Sleep:  Good   Screenings:   Assessment and Plan:   25 yo F with hx of anxiety disorder on Zoloft since March of 2019 and was previously in counseling at [facility] [employer].  She is  diagnosed with generalized anxiety disorder with panic attacks and seems to be responding well to her current medication which is Zoloft 100 mg once a day and BuSpar 10 mg 2 times a day.  Reviewed response to current medications and it appears her anxiety remains stable.    Plan as below:    Plan: Problem 1: Generalized Anxiety with Panic attack(chronic and stable) Plan: - Continue Zoloft 100 mg Daily - Continue Buspar 10 mg BID(takes mostly once a day and twice a day if needed) - No counseling at this time, currently doing better.  - Follow up in three months or early if needed. [name], MD 10/02/2020, 2:10 PM

## 2020-10-02 NOTE — Addendum Note (Signed)
Addended by: Lorenso Quarry on: 10/02/2020 02:13 PM   Modules accepted: Level of Service

## 2020-10-04 ENCOUNTER — Other Ambulatory Visit: Payer: Self-pay

## 2020-10-04 ENCOUNTER — Encounter: Payer: Self-pay | Admitting: Endocrinology

## 2020-10-04 ENCOUNTER — Telehealth (INDEPENDENT_AMBULATORY_CARE_PROVIDER_SITE_OTHER): Payer: BC Managed Care – PPO | Admitting: Endocrinology

## 2020-10-04 VITALS — Ht 65.0 in | Wt 213.0 lb

## 2020-10-04 DIAGNOSIS — E038 Other specified hypothyroidism: Secondary | ICD-10-CM | POA: Diagnosis not present

## 2020-10-04 DIAGNOSIS — E201 Pseudohypoparathyroidism: Secondary | ICD-10-CM | POA: Diagnosis not present

## 2020-10-04 NOTE — Progress Notes (Signed)
Patient ID: Colleen Tapia, female   DOB: 16-May-1996, 25 y.o.   MRN: [id number]          I connected with the above-named patient by video enabled telemedicine application and verified that I am speaking with the correct person. The patient was explained the limitations of evaluation and management by telemedicine and the availability of in person appointments.  Patient also understood that there may be a patient responsible charge related to this service . Location of the patient: Patient's home . Location of the provider: Physician office Only the patient and myself were participating in the encounter The patient understood the above statements and agreed to proceed.   Chief complaint: Endocrinology follow-up  History of Present Illness:   Background history: She went for routine visit with her PCP and was found to have a calcium was 6.9 in June 2021 She thinks that her calcium probably had been low previously also Ionized calcium was also low at 3.7, normal >4.5 PTH level 384  RECENT HISTORY: Her pretreatment symptoms were tightness and cramping in her fingers, at times also a tightness in her face making it difficult to talk She was complaining of feeling tired and having headaches   Calcium levels have been normal with taking CALCITRIOL 0.25 mcg twice daily She takes this quite regularly Has been continued on calcium supplements, using Os-Cal but is forgetting to take her supplement in the morning and only in the evening  No recent recurrence of complaints of feeling tight in her hands or face and no cramping in muscles Calcium is consistently back to normal at 8.7  Lab Results  Component Value Date   CALCIUM 8.7 10/02/2020   CALCIUM 8.5 07/13/2020   CALCIUM 8.1 (L) 03/05/2020   CALCIUM 7.6 (L) 02/01/2020   Lab Results  Component Value Date   CALCIUM 8.7 10/02/2020   PHOS 4.2 07/13/2020    Other problems addressed today: See review of systems   Past Medical  History:  Diagnosis Date  . Anxiety     Past Surgical History:  Procedure Laterality Date  . MOUTH SURGERY      Family History  Problem Relation Age of Onset  . Anxiety disorder Mother   . Depression Mother     Social History:  reports that she has never smoked. She has never used smokeless tobacco. She reports current alcohol use of about 3.0 - 5.0 standard drinks of alcohol per week. She reports previous drug use.  Allergies:  Allergies  Allergen Reactions  . No Known Allergies     Allergies as of 10/04/2020      Reactions   No Known Allergies       Medication List       Accurate as of October 04, 2020  4:39 PM. If you have any questions, ask your nurse or doctor.        STOP taking these medications   CALTRATE 600+D PLUS MINERALS PO Stopped by: Reather Littler, MD   Vitamin D (Ergocalciferol) 1.25 MG (50000 UNIT) Caps capsule Commonly known as: DRISDOL Stopped by: Reather Littler, MD     TAKE these medications   busPIRone 10 MG tablet Commonly known as: BUSPAR Take 1 tablet (10 mg total) by mouth 2 (two) times daily.   calcitRIOL 0.25 MCG capsule Commonly known as: ROCALTROL TAKE ONE CAPSULE BY MOUTH EVERY MORNING AND TAKE ONE CAPSULE BY MOUTH EVERY NIGHT AT BEDTIME   Calcium Carbonate-Vitamin D 600-400 MG-UNIT tablet Take by mouth.  fluticasone 50 MCG/ACT nasal spray Commonly known as: FLONASE Place into the nose.   levocetirizine 5 MG tablet Commonly known as: XYZAL Take 5 mg by mouth every evening.   levothyroxine 100 MCG tablet Commonly known as: SYNTHROID Take 1 tablet (100 mcg total) by mouth daily.   sertraline 100 MG tablet Commonly known as: ZOLOFT Take 1 tablet (100 mg total) by mouth daily.   Sprintec 28 0.25-35 MG-MCG tablet Generic drug: norgestimate-ethinyl estradiol          Review of Systems  HYPOTHYROIDISM:   She had been complaining of fatigue, weight gain but no cold intolerance at her initial visit  Had a slightly low  baseline free T4 level of 0.59 With levothyroxine supplements she has had less fatigue Recently feeling fairly good although she thinks she is tired because of her busy routine  Her levothyroxine dose has been increased progressively and is taking 75 mcg since 8/21 Free T4 is slightly improved   Lab Results  Component Value Date   TSH 0.83 10/02/2020   TSH 0.99 07/13/2020   TSH 2.13 03/05/2020   FREET4 0.71 10/02/2020   FREET4 0.67 07/13/2020   FREET4 0.65 03/05/2020     Her weight has leveled off  Wt Readings from Last 3 Encounters:  10/04/20 213 lb (96.6 kg)  07/16/20 216 lb 3.2 oz (98.1 kg)  03/07/20 217 lb (98.4 kg)   OLIGOMENORRHEA: This has been treated with birth control pills, no pretreatment estradiol levels available  Prolactin level was normal   PHYSICAL EXAM:  Ht 5\' 5"  (1.651 m)   Wt 213 lb (96.6 kg)   BMI 35.45 kg/m    No exam done, patient remote   ASSESSMENT:    PSEUDOHYPOPARATHYROIDISM, familial  She had mild baseline symptoms of tightness in her hands and face and cramping with significantly long baseline calcium of 6.9  Calcitriol dose has been 0.25 mcg twice daily with calcium supplements Calcium is excellent at 8.7 and she is asymptomatic However she is mostly taking calcium only once a day    HYPOTHYROIDISM: She has secondary hypothyroidism with mildly low free T4 level which again may be familial She is doing subjectively well with no unusual fatigue, currently on 75 mcg of levothyroxine Free T4 slightly improved compared to previous visit  PLAN:    Continue calcitriol 0.25 mcg twice daily  Take calcium supplements twice daily consistently and can take the first dose either after breakfast or at lunchtime  Levothyroxine to be continued at 100 mcg  Follow-up in 6 months     Keiarah Orlowski 10/04/2020, 4:39 PM

## 2020-12-15 ENCOUNTER — Other Ambulatory Visit: Payer: Self-pay | Admitting: Endocrinology

## 2020-12-17 ENCOUNTER — Telehealth (INDEPENDENT_AMBULATORY_CARE_PROVIDER_SITE_OTHER): Payer: BC Managed Care – PPO | Admitting: Child and Adolescent Psychiatry

## 2020-12-17 ENCOUNTER — Other Ambulatory Visit: Payer: Self-pay

## 2020-12-17 DIAGNOSIS — F411 Generalized anxiety disorder: Secondary | ICD-10-CM | POA: Diagnosis not present

## 2020-12-17 DIAGNOSIS — F410 Panic disorder [episodic paroxysmal anxiety] without agoraphobia: Secondary | ICD-10-CM

## 2020-12-17 MED ORDER — SERTRALINE HCL 100 MG PO TABS: 100.0000 mg | ORAL_TABLET | Freq: Every day | ORAL | 0 refills | Status: DC

## 2020-12-17 MED ORDER — BUSPIRONE HCL 10 MG PO TABS: 10.0000 mg | ORAL_TABLET | Freq: Two times a day (BID) | ORAL | 0 refills | Status: DC

## 2020-12-17 NOTE — Progress Notes (Signed)
Virtual Visit via Video Note  I connected with Colleen Tapia on 12/17/20 at  2:00 PM EDT by a video enabled telemedicine application and verified that I am speaking with the correct person using two identifiers.  Location: Patient: Home Provider: Office   I discussed the limitations of evaluation and management by telemedicine and the availability of in person appointments. The patient expressed understanding and agreed to proceed.  I discussed the assessment and treatment plan with the patient. The patient was provided an opportunity to ask questions and all were answered. The patient agreed with the plan and demonstrated an understanding of the instructions.   The patient was advised to call back or seek an in-person evaluation if the symptoms worsen or if the condition fails to improve as anticipated.  I provided 15 minutes of non-face-to-face time during this encounter.   [name], MD     [facility] MD/PA/NP OP Progress Note  12/17/2020 2:20 PM CHAVIE KOLINSKI  MRN:  [id number]  Chief Complaint: Medication management follow-up.  HPI: This is a 25 year old Caucasian female with generalized anxiety disorder and panic attacks on Zoloft and BuSpar since March 2019 was seen and evaluated over telemedicine encounter for medication management follow-up.  Colleen Tapia was presented by herself at her home in [place].  She denies any concerns for today's appointment and reports that she has continued to do well since the last appointment.  She reports that her anxiety has been "normal" and rates it at 5 out of 10((10 = most anxious), and anxiety has been manageable.  She denies problems with mood, denies any low lows or depressed mood.  She denies problems with sleep, appetite, energy.  She denies any suicidal thoughts or thoughts of violence.  She reports that she has been compliant to her medications and denies any side effects.  She reports that she had a follow-up with her endocrinology for  hypoparathyroidism and she is now recommended to follow-up in 6 months.  We discussed to continue with current medications and follow-up in 3 months or earlier if needed.  She verbalized understanding and agreed with the plan.  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  She reports that she has 1 more semester left before she finishes her college.  Visit Diagnosis:    ICD-10-CM   1. Generalized anxiety disorder with panic attacks  F41.1 sertraline (ZOLOFT) 100 MG tablet   F41.0 busPIRone (BUSPAR) 10 MG tablet    Past Psychiatric History: As mentioned in initial H&P, reviewed today, no change  Past Medical History:  Past Medical History:  Diagnosis Date  . Anxiety     Past Surgical History:  Procedure Laterality Date  . MOUTH SURGERY      Family Psychiatric History: As mentioned in initial H&P, reviewed today, no change  Family History:  Family History  Problem Relation Age of Onset  . Anxiety disorder Mother   . Depression Mother     Social History:  Social History   Socioeconomic History  . Marital status: Single    Spouse name: Not on file  . Number of children: 0  . Years of education: Not on file  . Highest education level: Some college, no degree  Occupational History  . Not on file  Tobacco Use  . Smoking status: Never Smoker  . Smokeless tobacco: Never Used  Vaping Use  . Vaping Use: Never used  Substance and Sexual Activity  . Alcohol  use: Yes    Alcohol/week: 3.0 - 5.0 standard drinks    Types: 3 - 4 Shots of liquor per week  . Drug use: Not Currently  . Sexual activity: Not Currently  Other Topics Concern  . Not on file  Social History Narrative  . Not on file   Social Determinants of Health   Financial Resource Strain: Not on file  Food Insecurity: Not on file  Transportation Needs: Not on file  Physical Activity: Not on file  Stress: Not on file  Social Connections: Not on file     Allergies:  Allergies  Allergen Reactions  . No Known Allergies     Metabolic Disorder Labs: No results found for: HGBA1C, MPG Lab Results  Component Value Date   PROLACTIN 13.5 02/01/2020   No results found for: CHOL, TRIG, HDL, CHOLHDL, VLDL, LDLCALC Lab Results  Component Value Date   TSH 0.83 10/02/2020   TSH 0.99 07/13/2020    Therapeutic Level Labs: No results found for: LITHIUM No results found for: VALPROATE No components found for:  CBMZ  Current Medications: Current Outpatient Medications  Medication Sig Dispense Refill  . busPIRone (BUSPAR) 10 MG tablet Take 1 tablet (10 mg total) by mouth 2 (two) times daily. 180 tablet 0  . calcitRIOL (ROCALTROL) 0.25 MCG capsule TAKE ONE CAPSULE BY MOUTH EVERY MORNING AND TAKE ONE CAPSULE BY MOUTH EVERY NIGHT AT BEDTIME 60 capsule 3  . Calcium Carbonate-Vitamin D 600-400 MG-UNIT tablet Take by mouth.    . fluticasone (FLONASE) 50 MCG/ACT nasal spray Place into the nose.    . Lactobacillus (AZO COMPLETE FEMININE BALANCE PO) AZO Complete Feminine Balance    . levocetirizine (XYZAL) 5 MG tablet Take 5 mg by mouth every evening.    Marland Kitchen levothyroxine (SYNTHROID) 100 MCG tablet Take 1 tablet (100 mcg total) by mouth daily. 30 tablet 3  . sertraline (ZOLOFT) 100 MG tablet Take 1 tablet (100 mg total) by mouth daily. 90 tablet 0  . SPRINTEC 28 0.25-35 MG-MCG tablet      No current facility-administered medications for this visit.     Musculoskeletal: Strength & Muscle Tone: unable to assess since visit was over the telemedicine. Gait & Station: unable to assess since visit was over the telemedicine. Patient leans: N/A  Psychiatric Specialty Exam: ROSReview of 12 systems negative except as mentioned in HPI   There were no vitals taken for this visit.There is no height or weight on file to calculate BMI.  General Appearance: Casual and Well Groomed  Eye Contact:  Good  Speech:  Clear and Coherent and Normal Rate  Volume:   Normal  Mood:  "good..."  Affect:  Appropriate, Congruent and Full Range  Thought Process:  Goal Directed and Linear  Orientation:  Full (Time, Place, and Person)  Thought Content: Logical   Suicidal Thoughts:  No  Homicidal Thoughts:  No  Memory:  Immediate;   Fair Recent;   Fair Remote;   Fair  Judgement:  Good  Insight:  Good  Psychomotor Activity:  Normal  Concentration:  Concentration: Good and Attention Span: Good  Recall:  Good  Fund of Knowledge: Good  Language: Good  Akathisia:  No    AIMS (if indicated): not done  Assets:  Communication Skills Desire for Improvement Financial Resources/Insurance Housing Leisure Time Physical Health Social Support Talents/Skills Transportation Vocational/Educational  ADL's:  Intact  Cognition: WNL  Sleep:  Good   Screenings:   Assessment and Plan:   25 yo F  with hx of anxiety disorder on Zoloft since March of 2019 and was previously in counseling at [facility].  She is diagnosed with generalized anxiety disorder with panic attacks and seems to be responding well to her current medication which is Zoloft 100 mg once a day and BuSpar 10 mg 2 times a day.  Reviewed response to current medications and she continued to appear stable with her anxiety.    Plan as below:    Plan: Problem 1: Generalized Anxiety with Panic attack(chronic and stable) Plan: - Continue Zoloft 100 mg Daily - Continue Buspar 10 mg BID(takes mostly once a day and twice a day if needed) - No counseling at this time, currently doing better.  - Follow up in three months or early if needed. [name], MD 12/17/2020, 2:20 PM

## 2021-01-16 ENCOUNTER — Telehealth: Payer: Self-pay

## 2021-01-16 NOTE — Telephone Encounter (Signed)
Spoke with her, she reported that she has been having episodes about twice a week that she feels hot and becomes red but does not feel anxious or having any panic symptoms at that time. She was recommended to speak with endo if this is related to her hypothyroidism. She also reports that since last three weeks her mood has been all over the place and states somedays she is happy and somedays she is angry at everything. I discussed with her to make an earlier appointment and scheduled her on 06/27 at 8:30

## 2021-01-16 NOTE — Telephone Encounter (Signed)
pt called she states that her mood is off that she just dont feel right and she wanted to speak with you about maybe changing something

## 2021-01-17 ENCOUNTER — Telehealth: Payer: Self-pay | Admitting: Endocrinology

## 2021-01-17 NOTE — Telephone Encounter (Signed)
Patient called to request Lab Orders be sent to [facility] in [facility] Fax# [phone] Patient is scheduled to see Dr. Lucianne Muss on 01/29/21 and will be having prior labs done week of 01/21/21 at [facility] in [place]

## 2021-01-20 ENCOUNTER — Other Ambulatory Visit: Payer: Self-pay | Admitting: Endocrinology

## 2021-01-20 DIAGNOSIS — E201 Pseudohypoparathyroidism: Secondary | ICD-10-CM

## 2021-01-20 DIAGNOSIS — E038 Other specified hypothyroidism: Secondary | ICD-10-CM

## 2021-01-20 NOTE — Addendum Note (Signed)
Addended by: Reather Littler on: 01/20/2021 09:25 PM   Modules accepted: Orders

## 2021-01-21 ENCOUNTER — Other Ambulatory Visit: Payer: Self-pay | Admitting: Endocrinology

## 2021-01-21 ENCOUNTER — Encounter: Payer: Self-pay | Admitting: Child and Adolescent Psychiatry

## 2021-01-21 ENCOUNTER — Other Ambulatory Visit: Payer: Self-pay

## 2021-01-21 ENCOUNTER — Telehealth (INDEPENDENT_AMBULATORY_CARE_PROVIDER_SITE_OTHER): Payer: BC Managed Care – PPO | Admitting: Child and Adolescent Psychiatry

## 2021-01-21 DIAGNOSIS — F411 Generalized anxiety disorder: Secondary | ICD-10-CM | POA: Diagnosis not present

## 2021-01-21 DIAGNOSIS — E038 Other specified hypothyroidism: Secondary | ICD-10-CM

## 2021-01-21 DIAGNOSIS — F39 Unspecified mood [affective] disorder: Secondary | ICD-10-CM | POA: Diagnosis not present

## 2021-01-21 DIAGNOSIS — E201 Pseudohypoparathyroidism: Secondary | ICD-10-CM

## 2021-01-21 MED ORDER — LAMOTRIGINE 25 MG PO TABS
ORAL_TABLET | ORAL | 0 refills | Status: DC
Start: 2021-01-21 — End: 2021-02-19

## 2021-01-21 NOTE — Progress Notes (Signed)
Virtual Visit via Video Note  I connected with Colleen Tapia on 01/21/21 at  8:30 AM EDT by a video enabled telemedicine application and verified that I am speaking with the correct person using two identifiers.  Location: Patient: Home Provider: Office   I discussed the limitations of evaluation and management by telemedicine and the availability of in person appointments. The patient expressed understanding and agreed to proceed.  I discussed the assessment and treatment plan with the patient. The patient was provided an opportunity to ask questions and all were answered. The patient agreed with the plan and demonstrated an understanding of the instructions.   The patient was advised to call back or seek an in-person evaluation if the symptoms worsen or if the condition fails to improve as anticipated.  I provided 25 minutes of non-face-to-face time during this encounter.   [name], MD     [facility] MD/PA/NP OP Progress Note  01/21/2021 8:56 AM Colleen Tapia  MRN:  [id number]  Chief Complaint:   Medication management follow-up.  HPI: This is a 25 year old Caucasian female with generalized anxiety disorder and panic attacks on Zoloft and BuSpar since March 2019 was seen and evaluated over telemedicine encounter for medication management follow-up.  She was last evaluated about a month ago and was recommended to continue her current medications however she called last week and reported that she has been having difficulties with her mood and therefore was recommended to make an appointment today.  Jonte reports that since last few weeks her mood has been "off".  She reports that her mood has been often fluctuating from being happy, talkative, racing thoughts lasting for a day or 2 to more irritable/not wanting to talk to anyone for a day or 2 to more depressed/isolative/excessive sleep/lack of energy lasting for a day or two. She denies any SI/HI/AVH  during these periods. She  reports that she also has had few episodes during which she turns red but not noticing any anxiety during these periods. She reports that her anxiety is more but not as bad as it used to be. She asks if she has bipolar disorder as her mother and brother both has hx of bipolar disorder. Her concerns were addressed, discussed that she does not have classic manic or hypomanic episodes required for Bipolar D/O diagnosis however given her mood instability reported above, will recommend taking Lamictal.  Discussed risks and benefits including but not limited to Stevens-Johnson syndrome(SJS) and educated about the signs of SJS and to go to the emergency room if she sees any signs for SJS.  And also has hx of hypothyroidism(currently on treatment) which also could contribute to mood problems and she has already made an appointment with her endocrinologist and doing the blood work for hypothyroidism.   Visit Diagnosis:    ICD-10-CM   1. Mood disorder (HCC)  F39 lamoTRIgine (LAMICTAL) 25 MG tablet    2. Generalized anxiety disorder  F41.1       Past Psychiatric History: As mentioned in initial H&P, reviewed today, no change  Past Medical History:  Past Medical History:  Diagnosis Date   Anxiety     Past Surgical History:  Procedure Laterality Date   MOUTH SURGERY      Family Psychiatric History: As mentioned in initial H&P, reviewed today, no change  Family History:  Family History  Problem Relation Age of Onset   Anxiety disorder Mother    Depression Mother     Social History:  Social History   Socioeconomic History   Marital status: Single    Spouse name: Not on file   Number of children: 0   Years of education: Not on file   Highest education level: Some college, no degree  Occupational History   Not on file  Tobacco Use   Smoking status: Never   Smokeless tobacco: Never  Vaping Use   Vaping Use: Never used  Substance and Sexual Activity   Alcohol use: Yes    Alcohol/week:  3.0 - 5.0 standard drinks    Types: 3 - 4 Shots of liquor per week   Drug use: Not Currently   Sexual activity: Not Currently  Other Topics Concern   Not on file  Social History Narrative   Not on file   Social Determinants of Health   Financial Resource Strain: Not on file  Food Insecurity: Not on file  Transportation Needs: Not on file  Physical Activity: Not on file  Stress: Not on file  Social Connections: Not on file    Allergies:  Allergies  Allergen Reactions   No Known Allergies     Metabolic Disorder Labs: No results found for: HGBA1C, MPG Lab Results  Component Value Date   PROLACTIN 13.5 02/01/2020   No results found for: CHOL, TRIG, HDL, CHOLHDL, VLDL, LDLCALC Lab Results  Component Value Date   TSH 0.83 10/02/2020   TSH 0.99 07/13/2020    Therapeutic Level Labs: No results found for: LITHIUM No results found for: VALPROATE No components found for:  CBMZ  Current Medications: Current Outpatient Medications  Medication Sig Dispense Refill   lamoTRIgine (LAMICTAL) 25 MG tablet Take 1 tablet (25 mg total) by mouth daily for 14 days, THEN 1 tablet (25 mg total) 2 (two) times daily for 21 days. 56 tablet 0   busPIRone (BUSPAR) 10 MG tablet Take 1 tablet (10 mg total) by mouth 2 (two) times daily. 180 tablet 0   calcitRIOL (ROCALTROL) 0.25 MCG capsule TAKE ONE CAPSULE BY MOUTH EVERY MORNING AND TAKE ONE CAPSULE BY MOUTH EVERY NIGHT AT BEDTIME 60 capsule 3   Calcium Carbonate-Vitamin D 600-400 MG-UNIT tablet Take by mouth.     fluticasone (FLONASE) 50 MCG/ACT nasal spray Place into the nose.     Lactobacillus (AZO COMPLETE FEMININE BALANCE PO) AZO Complete Feminine Balance     levocetirizine (XYZAL) 5 MG tablet Take 5 mg by mouth every evening.     levothyroxine (SYNTHROID) 100 MCG tablet Take 1 tablet (100 mcg total) by mouth daily. 30 tablet 3   sertraline (ZOLOFT) 100 MG tablet Take 1 tablet (100 mg total) by mouth daily. 90 tablet 0   SPRINTEC 28  0.25-35 MG-MCG tablet      No current facility-administered medications for this visit.     Musculoskeletal: Strength & Muscle Tone: unable to assess since visit was over the telemedicine. Gait & Station: unable to assess since visit was over the telemedicine. Patient leans: N/A  Psychiatric Specialty Exam: ROSReview of 12 systems negative except as mentioned in HPI   There were no vitals taken for this visit.There is no height or weight on file to calculate BMI.  General Appearance: Casual and Well Groomed  Eye Contact:  Good  Speech:  Clear and Coherent and Normal Rate  Volume:  Normal  Mood:  "all over the place"  Affect:  Appropriate, Congruent, and Constricted  Thought Process:  Goal Directed and Linear  Orientation:  Full (Time, Place, and Person)  Thought Content:  Logical   Suicidal Thoughts:  No  Homicidal Thoughts:  No  Memory:  Immediate;   Fair Recent;   Fair Remote;   Fair  Judgement:  Good  Insight:  Good  Psychomotor Activity:  Normal  Concentration:  Concentration: Good and Attention Span: Good  Recall:  Good  Fund of Knowledge: Good  Language: Good  Akathisia:  No    AIMS (if indicated): not done  Assets:  Communication Skills Desire for Improvement Financial Resources/Insurance Housing Leisure Time Physical Health Social Support Talents/Skills Transportation Vocational/Educational  ADL's:  Intact  Cognition: WNL  Sleep:  Good   Screenings:   Assessment and Plan:   25 yo F with hx of anxiety disorder on Zoloft since March of 2019 and was previously in counseling at [facility].  She is diagnosed with generalized anxiety disorder with panic attacks and has responded well to her current medication which is Zoloft 100 mg once a day and BuSpar 10 mg 2 times a day. However recently has noticed more mood fluctuation as mentioned in HPI, does not appear consistent with bipolar disorder but has genetic predisposition. Also  has hx of hypothyroidism and currently receives treatment for it.  I discussed to start Lamictal for mood instability and she also has an appointment with her endocrinologist for her thyroid follow-up.  She will follow back again in a month or earlier if needed.    Plan as below:     Plan: Problem 1: Mood - Start Lamictal 25 mg daily and increase to 25 mg BID in 2 weeks.  - Risks and benefits discussed.    Problem 2: Generalized Anxiety with Panic attack(chronic and stable) Plan: - Continue Zoloft 100 mg Daily          - Continue Buspar 10 mg BID(takes mostly once a day and twice a day if needed)          - No counseling at this time, currently doing better.           - Follow up in 1 month or early if needed. [name], MD 01/21/2021, 8:56 AM

## 2021-01-23 NOTE — Telephone Encounter (Signed)
Called pt to update her.

## 2021-01-25 LAB — BASIC METABOLIC PANEL
BUN/Creatinine Ratio: 10 (ref 9–23)
BUN: 7 mg/dL (ref 6–20)
CO2: 22 mmol/L (ref 20–29)
Calcium: 8.9 mg/dL (ref 8.7–10.2)
Chloride: 102 mmol/L (ref 96–106)
Creatinine, Ser: 0.7 mg/dL (ref 0.57–1.00)
Glucose: 76 mg/dL (ref 65–99)
Potassium: 4.5 mmol/L (ref 3.5–5.2)
Sodium: 139 mmol/L (ref 134–144)
eGFR: 125 mL/min/{1.73_m2} (ref >59)

## 2021-01-25 LAB — TSH: TSH: 0.55 u[IU]/mL (ref 0.450–4.500)

## 2021-01-25 LAB — T4, FREE: Free T4: 1.43 ng/dL (ref 0.82–1.77)

## 2021-01-29 ENCOUNTER — Ambulatory Visit (INDEPENDENT_AMBULATORY_CARE_PROVIDER_SITE_OTHER): Payer: BC Managed Care – PPO | Admitting: Endocrinology

## 2021-01-29 ENCOUNTER — Encounter: Payer: Self-pay | Admitting: Endocrinology

## 2021-01-29 ENCOUNTER — Other Ambulatory Visit: Payer: Self-pay

## 2021-01-29 ENCOUNTER — Telehealth: Payer: Self-pay | Admitting: *Deleted

## 2021-01-29 VITALS — BP 130/92 | HR 85 | Ht 65.0 in | Wt 212.4 lb

## 2021-01-29 DIAGNOSIS — E038 Other specified hypothyroidism: Secondary | ICD-10-CM

## 2021-01-29 DIAGNOSIS — E201 Pseudohypoparathyroidism: Secondary | ICD-10-CM

## 2021-01-29 MED ORDER — LEVOTHYROXINE SODIUM 50 MCG PO TABS
50.0000 ug | ORAL_TABLET | Freq: Every day | ORAL | 3 refills | Status: DC
Start: 2021-01-29 — End: 2021-05-02

## 2021-01-29 NOTE — Progress Notes (Signed)
Patient ID: Colleen Tapia, female   DOB: 1995/10/06, 25 y.o.   MRN: [id number]            Chief complaint: Endocrinology follow-up  History of Present Illness:   Background history: She went for routine visit with her PCP and was found to have a calcium was 6.9 in June 2021 She thinks that her calcium probably had been low previously also Ionized calcium was also low at 3.7, normal >4.5 PTH level 384  RECENT HISTORY: Her pretreatment symptoms were tightness and cramping in her fingers, at times also a tightness in her face making it difficult to talk She was also complaining of feeling tired and having headaches   Calcium levels have been normal with taking CALCITRIOL 0.25 mcg twice daily She takes her medications with the help of pillbox regularly Has been continued on calcium supplements, using Os-Cal but is again forgetting to take her supplement in the morning, takes it only in the evening  Has not had any further symptoms of feeling tight in her hands or face and no cramping in muscles Calcium is consistently normal and below 9.0  Lab Results  Component Value Date   CALCIUM 8.9 01/24/2021   CALCIUM 8.7 10/02/2020   CALCIUM 8.5 07/13/2020   CALCIUM 8.1 (L) 03/05/2020   CALCIUM 7.6 (L) 02/01/2020   Lab Results  Component Value Date   CALCIUM 8.9 01/24/2021   PHOS 4.2 07/13/2020    Other problems addressed today: See review of systems   Past Medical History:  Diagnosis Date   Anxiety     Past Surgical History:  Procedure Laterality Date   MOUTH SURGERY      Family History  Problem Relation Age of Onset   Anxiety disorder Mother    Depression Mother     Social History:  reports that she has never smoked. She has never used smokeless tobacco. She reports current alcohol use of about 3.0 - 5.0 standard drinks of alcohol per week. She reports previous drug use.  Allergies:  Allergies  Allergen Reactions   No Known Allergies     Allergies as of  01/29/2021       Reactions   No Known Allergies         Medication List        Accurate as of January 29, 2021  9:49 AM. If you have any questions, ask your nurse or doctor.          AZO COMPLETE FEMININE BALANCE PO AZO Complete Feminine Balance   busPIRone 10 MG tablet Commonly known as: BUSPAR Take 1 tablet (10 mg total) by mouth 2 (two) times daily.   calcitRIOL 0.25 MCG capsule Commonly known as: ROCALTROL TAKE ONE CAPSULE BY MOUTH EVERY MORNING AND TAKE ONE CAPSULE BY MOUTH EVERY NIGHT AT BEDTIME   Calcium Carbonate-Vitamin D 600-400 MG-UNIT tablet Take by mouth.   fluticasone 50 MCG/ACT nasal spray Commonly known as: FLONASE Place into the nose.   lamoTRIgine 25 MG tablet Commonly known as: LaMICtal Take 1 tablet (25 mg total) by mouth daily for 14 days, THEN 1 tablet (25 mg total) 2 (two) times daily for 21 days. Start taking on: January 21, 2021   levocetirizine 5 MG tablet Commonly known as: XYZAL Take 5 mg by mouth every evening.   levothyroxine 100 MCG tablet Commonly known as: SYNTHROID TAKE 1 TABLET BY MOUTH DAILY   sertraline 100 MG tablet Commonly known as: ZOLOFT Take 1 tablet (100 mg total) by  mouth daily.   Sprintec 28 0.25-35 MG-MCG tablet Generic drug: norgestimate-ethinyl estradiol           Review of Systems  HYPOTHYROIDISM:   She had been complaining of fatigue and weight gain but no cold intolerance at her initial visit  Had a slightly low baseline free T4 level of 0.59 With levothyroxine supplements she had less fatigue In the last month or so she has had more mood swings, difficulty losing weight, feeling periodically hot and sweaty  Her levothyroxine dose has been increased previously and is taking 100 mcg since 06/2020 Free T4 is high normal now at 1.4 compared to 0.71 She takes her levothyroxine before breakfast daily   Lab Results  Component Value Date   TSH 0.550 01/24/2021   TSH 0.83 10/02/2020   TSH 0.99  07/13/2020   FREET4 1.43 01/24/2021   FREET4 0.71 10/02/2020   FREET4 0.67 07/13/2020     She says she has difficulty losing weight even though she is exercising  Wt Readings from Last 3 Encounters:  01/29/21 212 lb 6.4 oz (96.3 kg)  10/04/20 213 lb (96.6 kg)  07/16/20 216 lb 3.2 oz (98.1 kg)   OLIGOMENORRHEA: This has been treated with birth control pills, no pretreatment estradiol levels available  Prolactin level was normal   PHYSICAL EXAM:  BP (!) 130/92   Pulse 85   Ht 5\' 5"  (1.651 m)   Wt 212 lb 6.4 oz (96.3 kg)   SpO2 99%   BMI 35.35 kg/m   No tremor Hands not unusually warm or moist Biceps reflexes appear normal   ASSESSMENT:   PSEUDOHYPOPARATHYROIDISM, familial  She had mild baseline symptoms of tightness in her hands and face and cramping with significantly long baseline calcium of 6.9 Symptoms have resolved  Calcitriol dose has been unchanged at 0.25 mcg twice daily with calcium supplements Calcium is again normal However this is despite taking calcium only once a day   HYPOTHYROIDISM: She has secondary hypothyroidism with mildly low free T4 level at baseline Although her fatigue had improved with thyroid supplementation she is now having difficulty with more depression and anxiety, feeling hot and sweaty at times although no shakiness or palpitations.  Also her free T4 is upper normal compared to only 0.1 previously  She continues to be on birth control pill for menstrual cycle regulation  Increased blood pressure: This was unchanged on second measurement-unclear if this is related to anxiety Difficulty with weight loss: This may be related to genetic factors, history of depression and antidepressants as well as disordered sleep    PLAN:   Continue calcitriol 0.25 mcg twice daily Take calcium supplements twice daily consistently and can take the second dose at bedtime Will reduce her LEVOTHYROXINE to 50 mcg since she is likely symptomatic from  her normal free T4 levels She will also follow-up with her PCP next week for increased blood pressure  May need to discuss her difficulty with losing weight with her PCP and work with local weight loss management program     01/29/2021, 9:49 AM

## 2021-01-29 NOTE — Telephone Encounter (Signed)
**Note Colleen-Identified via Obfuscation** Patient called and Colleen Tapia stating she would like to change to another antidepressant. Per pt she discussed with her PCP and her Endocrinologist about her Zoloft and they informed her that her weight gain is most likely coming from her taking Zoloft. Per pt message she would like to change to another medication and if provider have time to please call her. Patient number is [phone]

## 2021-01-29 NOTE — Patient Instructions (Signed)
Take 1/2  dose thyroid pill, daily  Calcium at nite also

## 2021-01-30 MED ORDER — SERTRALINE HCL 50 MG PO TABS: ORAL_TABLET | ORAL | 0 refills | Status: DC

## 2021-01-30 MED ORDER — BUPROPION HCL ER (XL) 150 MG PO TB24: 150.0000 mg | ORAL_TABLET | Freq: Every day | ORAL | 0 refills | Status: DC

## 2021-01-30 NOTE — Addendum Note (Signed)
Addended by: Lorenso Quarry on: 01/30/2021 10:49 AM   Modules accepted: Orders

## 2021-01-30 NOTE — Telephone Encounter (Signed)
I spoke with her over the phone. She would like to switch Zoloft due to weight gain concerns. Discussed alternative SSRI vs Wellbutrin. Discussed Wellbutrin not likely to have weight problems but also not very effective for anxiety treatment. She would like to try Wellbutrin and will continue with Buspar for anxiety.  She will decrease Zoloft to 50 mg daily for a week and 25 mg for 2nd week before stopping, and start Wellbutrin XL from today.

## 2021-02-05 ENCOUNTER — Other Ambulatory Visit: Payer: Self-pay

## 2021-02-05 ENCOUNTER — Ambulatory Visit (INDEPENDENT_AMBULATORY_CARE_PROVIDER_SITE_OTHER): Payer: BC Managed Care – PPO

## 2021-02-05 ENCOUNTER — Ambulatory Visit (INDEPENDENT_AMBULATORY_CARE_PROVIDER_SITE_OTHER): Payer: BC Managed Care – PPO | Admitting: Podiatry

## 2021-02-05 DIAGNOSIS — M722 Plantar fascial fibromatosis: Secondary | ICD-10-CM

## 2021-02-05 MED ORDER — MELOXICAM 15 MG PO TABS: 15.0000 mg | ORAL_TABLET | Freq: Every day | ORAL | 1 refills | Status: DC

## 2021-02-05 MED ORDER — BETAMETHASONE SOD PHOS & ACET 6 (3-3) MG/ML IJ SUSP
3.0000 mg | Freq: Once | INTRAMUSCULAR | Status: AC
  Administered 2021-02-05: 3 mg via INTRA_ARTICULAR

## 2021-02-05 MED ORDER — METHYLPREDNISOLONE 4 MG PO TBPK: ORAL_TABLET | ORAL | 0 refills | Status: DC

## 2021-02-05 NOTE — Progress Notes (Signed)
   Subjective: 25 y.o. female presenting today as a new patient for evaluation of left heel pain has been going on for about 6 weeks now.  Patient loves to exercise and run on the treadmill and she says that she has been experiencing increased pain and tenderness to her left heel.  She has been icing and stretching to help alleviate her symptoms with minimal relief.   Past Medical History:  Diagnosis Date   Anxiety      Objective: Physical Exam General: The patient is alert and oriented x3 in no acute distress.  Dermatology: Skin is warm, dry and supple bilateral lower extremities. Negative for open lesions or macerations bilateral.   Vascular: Dorsalis Pedis and Posterior Tibial pulses palpable bilateral.  Capillary fill time is immediate to all digits.  Neurological: Epicritic and protective threshold intact bilateral.   Musculoskeletal: Tenderness to palpation to the plantar aspect of the left heel along the plantar fascia. All other joints range of motion within normal limits bilateral. Strength 5/5 in all groups bilateral.   Radiographic exam: Normal osseous mineralization. Joint spaces preserved. No fracture/dislocation/boney destruction. No other soft tissue abnormalities or radiopaque foreign bodies.   Assessment: 1. Plantar fasciitis left foot  Plan of Care:  1. Patient evaluated. Xrays reviewed.   2. Injection of 0.5cc Celestone soluspan injected into the left plantar fascia.  3. Rx for Medrol Dose Pak placed 4. Rx for Meloxicam ordered for patient. 5.  Night splint dispensed.  Wear nightly 6. Instructed patient regarding therapies and modalities at home to alleviate symptoms.  7.  Patient has tried the plantar fascial brace.  Discontinue.  Continue Brooks running shoes  8.  Return to clinic in 4 weeks.    *Getting her masters in public health.  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 Felecia Shelling, DPM     [address] [place], [place] [address]                Office [phone]  Fax [phone]

## 2021-02-19 ENCOUNTER — Other Ambulatory Visit: Payer: Self-pay | Admitting: Child and Adolescent Psychiatry

## 2021-02-19 ENCOUNTER — Other Ambulatory Visit: Payer: Self-pay

## 2021-02-19 ENCOUNTER — Telehealth (INDEPENDENT_AMBULATORY_CARE_PROVIDER_SITE_OTHER): Payer: BC Managed Care – PPO | Admitting: Child and Adolescent Psychiatry

## 2021-02-19 DIAGNOSIS — F321 Major depressive disorder, single episode, moderate: Secondary | ICD-10-CM | POA: Diagnosis not present

## 2021-02-19 DIAGNOSIS — F410 Panic disorder [episodic paroxysmal anxiety] without agoraphobia: Secondary | ICD-10-CM | POA: Diagnosis not present

## 2021-02-19 DIAGNOSIS — F411 Generalized anxiety disorder: Secondary | ICD-10-CM | POA: Diagnosis not present

## 2021-02-19 DIAGNOSIS — F39 Unspecified mood [affective] disorder: Secondary | ICD-10-CM

## 2021-02-19 MED ORDER — BUPROPION HCL ER (XL) 150 MG PO TB24: 150.0000 mg | ORAL_TABLET | Freq: Every day | ORAL | 0 refills | Status: DC

## 2021-02-19 NOTE — Progress Notes (Signed)
Virtual Visit via Video Note  I connected with Colleen Tapia on 02/19/21 at  9:00 AM EDT by a video enabled telemedicine application and verified that I am speaking with the correct person using two identifiers.  Location: Patient: Home Provider: Office   I discussed the limitations of evaluation and management by telemedicine and the availability of in person appointments. The patient expressed understanding and agreed to proceed.  I discussed the assessment and treatment plan with the patient. The patient was provided an opportunity to ask questions and all were answered. The patient agreed with the plan and demonstrated an understanding of the instructions.   The patient was advised to call back or seek an in-person evaluation if the symptoms worsen or if the condition fails to improve as anticipated.  I provided 20 minutes of non-face-to-face time during this encounter.   [name], MD     [facility] MD/PA/NP OP Progress Note  02/19/2021 9:30 AM Colleen Tapia  MRN:  [id number]  Chief Complaint:   Medication management follow-up.  HPI: This is a 25 year old Caucasian female with generalized anxiety disorder, MDD, panic attacks on Zoloft and BuSpar since March 2019 was seen and evaluated over telemedicine encounter for medication management follow-up.    In the interim since the last appointment she called and reported that she would like to be switched from Zoloft because of concerns regarding weight gain.  After careful discussion of risks and benefits of Zoloft and alternatives it was decided to switch her from Zoloft to Wellbutrin.  She is taking Wellbutrin XL 150 mg once a day since last 3 weeks.  Today she reports that she has been tolerating Wellbutrin well without any issues however her mood has continued to remain the same.  She reports that she remains angry, irritable and depressed.  When asked about what is making her angry she reports that state of affairs in the  world and feeling stuck in her personal life.  She reports that if she is given a choice she would rather stay in her room and not do anything because of lack of energy and irritability.  She reports that she sleeps about 6 or 7 hours at night, some days her sleep is restful and her energy level during the day depends on the day.  She reports that about 2 or 3 days a week she does "okay", where she is less irritable and depressed.  She denies any suicidal thoughts.  She reports that her anxiety has been stable and denies any concerns regarding anxiety.  She asked if she can be tried on Jordan because her brother and her mother takes the medicine.  I discussed with her that her history does not appear consistent with bipolar disorder and liberalize used for bipolar disorders.  Discussed risks associated with Latuda including weight gain and other metabolic side effects.  Discussed that her history is most consistent with major depressive disorder and therefore would recommend continuing Wellbutrin since it was started only 3 weeks ago and also keep Lamictal as it is.  We discussed that Wellbutrin can be increased if she continues to not respond to the current dose at her next appointment which is scheduled in 3 weeks.  She verbalized understanding and agreed with the plan.  I also discussed with her about recommendation for individual therapy and she reports that she has been working on it, reports that [facility] individual therapy sessions and she has started the process.  Visit Diagnosis:    ICD-10-CM   1. Current moderate episode of major depressive disorder without prior episode (HCC)  F32.1     2. Generalized anxiety disorder with panic attacks  F41.1    F41.0       Past Psychiatric History: As mentioned in initial H&P, reviewed today, no change  Past Medical History:  Past Medical History:  Diagnosis Date   Anxiety     Past Surgical History:  Procedure Laterality Date   MOUTH  SURGERY      Family Psychiatric History: As mentioned in initial H&P, reviewed today, no change  Family History:  Family History  Problem Relation Age of Onset   Anxiety disorder Mother    Depression Mother     Social History:  Social History   Socioeconomic History   Marital status: Single    Spouse name: Not on file   Number of children: 0   Years of education: Not on file   Highest education level: Some college, no degree  Occupational History   Not on file  Tobacco Use   Smoking status: Never   Smokeless tobacco: Never  Vaping Use   Vaping Use: Never used  Substance and Sexual Activity   Alcohol use: Yes    Alcohol/week: 3.0 - 5.0 standard drinks    Types: 3 - 4 Shots of liquor per week   Drug use: Not Currently   Sexual activity: Not Currently  Other Topics Concern   Not on file  Social History Narrative   Not on file   Social Determinants of Health   Financial Resource Strain: Not on file  Food Insecurity: Not on file  Transportation Needs: Not on file  Physical Activity: Not on file  Stress: Not on file  Social Connections: Not on file    Allergies:  Allergies  Allergen Reactions   No Known Allergies     Metabolic Disorder Labs: No results found for: HGBA1C, MPG Lab Results  Component Value Date   PROLACTIN 13.5 02/01/2020   No results found for: CHOL, TRIG, HDL, CHOLHDL, VLDL, LDLCALC Lab Results  Component Value Date   TSH 0.550 01/24/2021   TSH 0.83 10/02/2020    Therapeutic Level Labs: No results found for: LITHIUM No results found for: VALPROATE No components found for:  CBMZ  Current Medications: Current Outpatient Medications  Medication Sig Dispense Refill   buPROPion (WELLBUTRIN XL) 150 MG 24 hr tablet Take 1 tablet (150 mg total) by mouth daily. 30 tablet 0   busPIRone (BUSPAR) 10 MG tablet Take 1 tablet (10 mg total) by mouth 2 (two) times daily. 180 tablet 0   calcitRIOL (ROCALTROL) 0.25 MCG capsule TAKE ONE CAPSULE BY  MOUTH EVERY MORNING AND TAKE ONE CAPSULE BY MOUTH EVERY NIGHT AT BEDTIME 60 capsule 3   Calcium Carbonate-Vitamin D 600-400 MG-UNIT tablet Take by mouth.     fluticasone (FLONASE) 50 MCG/ACT nasal spray Place into the nose.     Lactobacillus (AZO COMPLETE FEMININE BALANCE PO) AZO Complete Feminine Balance     lamoTRIgine (LAMICTAL) 25 MG tablet Take 1 tablet (25 mg total) by mouth daily for 14 days, THEN 1 tablet (25 mg total) 2 (two) times daily for 21 days. 56 tablet 0   levocetirizine (XYZAL) 5 MG tablet Take 5 mg by mouth every evening.     levothyroxine (SYNTHROID) 50 MCG tablet Take 1 tablet (50 mcg total) by mouth daily. 30 tablet 3   meloxicam (MOBIC) 15 MG tablet Take 1  tablet (15 mg total) by mouth daily. 30 tablet 1   methylPREDNISolone (MEDROL DOSEPAK) 4 MG TBPK tablet 6 day dose pack - take as directed 21 tablet 0   SPRINTEC 28 0.25-35 MG-MCG tablet      No current facility-administered medications for this visit.     Musculoskeletal: Strength & Muscle Tone: unable to assess since visit was over the telemedicine. Gait & Station: unable to assess since visit was over the telemedicine. Patient leans: N/A  Psychiatric Specialty Exam: ROSReview of 12 systems negative except as mentioned in HPI   There were no vitals taken for this visit.There is no height or weight on file to calculate BMI.  General Appearance: Casual and Fairly Groomed  Eye Contact:  Good  Speech:  Clear and Coherent and Normal Rate  Volume:  Normal  Mood:  "all over the place"  Affect:  Appropriate, Congruent, and Constricted  Thought Process:  Goal Directed and Linear  Orientation:  Full (Time, Place, and Person)  Thought Content: Logical   Suicidal Thoughts:  No  Homicidal Thoughts:  No  Memory:  Immediate;   Fair Recent;   Fair Remote;   Fair  Judgement:  Good  Insight:  Good  Psychomotor Activity:  Normal  Concentration:  Concentration: Good and Attention Span: Good  Recall:  Good  Fund of  Knowledge: Good  Language: Good  Akathisia:  No    AIMS (if indicated): not done  Assets:  Communication Skills Desire for Improvement Financial Resources/Insurance Housing Leisure Time Physical Health Social Support Talents/Skills Transportation Vocational/Educational  ADL's:  Intact  Cognition: WNL  Sleep:  Good   Screenings:   Assessment and Plan:   25 yo F with hx of anxiety disorder on Zoloft since March of 2019 and was previously in counseling at [facility] [employer].  She is diagnosed with generalized anxiety disorder with panic attacks and has responded well to Zoloft 100 mg once a day and BuSpar 10 mg 2 times a day. However recently has noticed more mood fluctuation, irritability, depressed mood which appears most consistent with MDD vs bipolar disorder. She does have genetic predisposition to bipolar disorder. Also has hx of hypothyroidism and currently receives treatment for it.  She was started on Lamictal and her Zoloft was switched to Wellbutrin because of her concerns for weight gain. She does continue to have ongoing symptoms of depression, recommended to continue with Wellbutrin XL since she only started three weeks ago and she is working on establishing ind therapy.   She will follow back again in 3 weeks or earlier if needed.    Plan as below:     Plan: Problem 1: Mood - Continue with Wellbutrin XL 150 mg daily - Continue with Lamictal 25 mg BID - Risks and benefits discussed.  -She is recommended individual psychotherapy.  Patient is currently in process to establish individual psychotherapy.   Problem 2: Generalized Anxiety with Panic attack(chronic and stable) Plan:           - Continue Buspar 10 mg BID(takes mostly once a day and twice a day if needed)          -therapy as mentioned above          - Follow up in 3 weeks or early if needed. [name], MD 02/19/2021, 9:30 AM

## 2021-03-05 ENCOUNTER — Other Ambulatory Visit: Payer: Self-pay

## 2021-03-05 ENCOUNTER — Ambulatory Visit: Payer: BC Managed Care – PPO | Admitting: Podiatry

## 2021-03-05 DIAGNOSIS — M722 Plantar fascial fibromatosis: Secondary | ICD-10-CM | POA: Diagnosis not present

## 2021-03-05 MED ORDER — BETAMETHASONE SOD PHOS & ACET 6 (3-3) MG/ML IJ SUSP: 3.0000 mg | Freq: Once | INTRAMUSCULAR | Status: AC

## 2021-03-05 NOTE — Progress Notes (Signed)
   Subjective: 25 y.o. female presenting today for follow-up evaluation of plantar fasciitis to the left foot this been going on for about 2 months now.  She states that overall she is feeling significantly better.  She presents for further treatment and evaluation   Past Medical History:  Diagnosis Date   Anxiety      Objective: Physical Exam General: The patient is alert and oriented x3 in no acute distress.  Dermatology: Skin is warm, dry and supple bilateral lower extremities. Negative for open lesions or macerations bilateral.   Vascular: Dorsalis Pedis and Posterior Tibial pulses palpable bilateral.  Capillary fill time is immediate to all digits.  Neurological: Epicritic and protective threshold intact bilateral.   Musculoskeletal: Minimal tenderness to palpation to the plantar aspect of the left heel along the plantar fascia. All other joints range of motion within normal limits bilateral. Strength 5/5 in all groups bilateral.    Assessment: 1. Plantar fasciitis left foot  Plan of Care:  1. Patient evaluated. 2.  Injection of 0.5 cc Celestone Soluspan injected into the plantar fascial left 3.  Continue meloxicam daily as needed 4.  Continue wearing good supportive shoes and sneakers 5.  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 Felecia Shelling, DPM    [address] [place], [place] [address]                Office [phone]  Fax [phone]

## 2021-03-13 ENCOUNTER — Encounter: Payer: Self-pay | Admitting: Child and Adolescent Psychiatry

## 2021-03-13 ENCOUNTER — Other Ambulatory Visit: Payer: Self-pay

## 2021-03-13 ENCOUNTER — Telehealth (INDEPENDENT_AMBULATORY_CARE_PROVIDER_SITE_OTHER): Payer: BC Managed Care – PPO | Admitting: Child and Adolescent Psychiatry

## 2021-03-13 DIAGNOSIS — F410 Panic disorder [episodic paroxysmal anxiety] without agoraphobia: Secondary | ICD-10-CM

## 2021-03-13 DIAGNOSIS — F411 Generalized anxiety disorder: Secondary | ICD-10-CM

## 2021-03-13 DIAGNOSIS — F39 Unspecified mood [affective] disorder: Secondary | ICD-10-CM

## 2021-03-13 DIAGNOSIS — F3162 Bipolar disorder, current episode mixed, moderate: Secondary | ICD-10-CM | POA: Diagnosis not present

## 2021-03-13 MED ORDER — LAMOTRIGINE 25 MG PO TABS: 25.0000 mg | ORAL_TABLET | Freq: Two times a day (BID) | ORAL | 0 refills | Status: DC

## 2021-03-13 MED ORDER — BUSPIRONE HCL 10 MG PO TABS: 10.0000 mg | ORAL_TABLET | Freq: Two times a day (BID) | ORAL | 0 refills | Status: DC

## 2021-03-13 MED ORDER — LATUDA 20 MG PO TABS: 20.0000 mg | ORAL_TABLET | Freq: Every day | ORAL | 0 refills | Status: DC

## 2021-03-13 NOTE — Progress Notes (Signed)
Virtual Visit via Video Note  I connected with Colleen Tapia on 03/13/21 at  2:00 PM EDT by a video enabled telemedicine application and verified that I am speaking with the correct person using two identifiers.  Location: Patient: Home Provider: Office   I discussed the limitations of evaluation and management by telemedicine and the availability of in person appointments. The patient expressed understanding and agreed to proceed.  I discussed the assessment and treatment plan with the patient. The patient was provided an opportunity to ask questions and all were answered. The patient agreed with the plan and demonstrated an understanding of the instructions.   The patient was advised to call back or seek an in-person evaluation if the symptoms worsen or if the condition fails to improve as anticipated.  I provided 30 minutes of non-face-to-face time during this encounter.   [name], MD     [facility] MD/PA/NP OP Progress Note  03/13/2021 3:52 PM CAROLIE MCILRATH  MRN:  [id number]  Chief Complaint:   "Irritability, agitation"  HPI: This is a 25 year old Caucasian female with hx of generalized anxiety disorder, MDD, panic attacks previously on Zoloft and BuSpar since March 2019 and lately on Wellbutrin XL and Buspar for depression and anxiety was seen and evaluated over telemedicine encounter for medication management follow-up.  Today she was accompanied with her mother at her home.  She wanted her mother to be present for this appointment and therefore mother also attended appointment with patient.  I also spoke with [name] alone during the appointment.  Colleen Tapia's mother reports that she has noticed worsening of mood fluctuations from being irritable and agitated to tearfulness.  She reports that this is especially worse since Halcyon started taking Wellbutrin.  She expressed concerns for Colleen Tapia's mental health and reports that she has diagnosis of bipolar disorder and she takes Jordan,  Lamictal and Lexapro which works well for her.  She reports that her son also has bipolar diagnosis and also takes Jordan and Cymbalta which works well for him.  Colleen Tapia reports that she has been very irritable and agitated, also depressed and becomes tearful, reports that her mood fluctuation has worsened.  She reports that she sometimes does not want to get out of bed, does have difficulty going to sleep or staying asleep however gets about 7 hours of sleep on an average.  She reports that she gets very fixated and recently submitted about 100 applications.  She is not noted to have any pressured speech and her thought process was linear/logical and goal directed.  She also does not appear distracted during the evaluation.  She denies any SI/HI.   She expresses concerns regarding bipolar disorder and reports that she has researched online and many of the symptoms fit for her for hypomania. Given her reports and worsening of mood on both Zoloft and Wellbutrin, as well strong +ve family hx of bipolar disorder, we dicussed the possible diagnosis of Bipolar disorder for her. I discussed with her and her mother that Cymbalta is not generally recommended for bipolar disorder and therefore Latuda can be tried. After discussion of side effects including but not limited to metabolic side effects and EPS and benefits of Latuda, Sharea provided verbal informed consent for Jordan.  We discussed to continue with BuSpar as well for anxiety.  We discussed to discontinue Wellbutrin due to worsening of mood.  Visit Diagnosis:    ICD-10-CM   1. Bipolar disorder, current episode mixed, moderate (HCC)  F31.62 lurasidone (LATUDA)  20 MG TABS tablet    2. Generalized anxiety disorder with panic attacks  F41.1 busPIRone (BUSPAR) 10 MG tablet   F41.0     3. Mood disorder (HCC)  F39 lamoTRIgine (LAMICTAL) 25 MG tablet      Past Psychiatric History: As mentioned in initial H&P, reviewed today, no change  Past Medical  History:  Past Medical History:  Diagnosis Date   Anxiety     Past Surgical History:  Procedure Laterality Date   MOUTH SURGERY      Family Psychiatric History: As mentioned in initial H&P, reviewed today, no change  Family History:  Family History  Problem Relation Age of Onset   Anxiety disorder Mother    Depression Mother     Social History:  Social History   Socioeconomic History   Marital status: Single    Spouse name: Not on file   Number of children: 0   Years of education: Not on file   Highest education level: Some college, no degree  Occupational History   Not on file  Tobacco Use   Smoking status: Never   Smokeless tobacco: Never  Vaping Use   Vaping Use: Never used  Substance and Sexual Activity   Alcohol use: Yes    Alcohol/week: 3.0 - 5.0 standard drinks    Types: 3 - 4 Shots of liquor per week   Drug use: Not Currently   Sexual activity: Not Currently  Other Topics Concern   Not on file  Social History Narrative   Not on file   Social Determinants of Health   Financial Resource Strain: Not on file  Food Insecurity: Not on file  Transportation Needs: Not on file  Physical Activity: Not on file  Stress: Not on file  Social Connections: Not on file    Allergies:  Allergies  Allergen Reactions   No Known Allergies     Metabolic Disorder Labs: No results found for: HGBA1C, MPG Lab Results  Component Value Date   PROLACTIN 13.5 02/01/2020   No results found for: CHOL, TRIG, HDL, CHOLHDL, VLDL, LDLCALC Lab Results  Component Value Date   TSH 0.550 01/24/2021   TSH 0.83 10/02/2020    Therapeutic Level Labs: No results found for: LITHIUM No results found for: VALPROATE No components found for:  CBMZ  Current Medications: Current Outpatient Medications  Medication Sig Dispense Refill   lurasidone (LATUDA) 20 MG TABS tablet Take 1 tablet (20 mg total) by mouth daily with breakfast. 30 tablet 0   busPIRone (BUSPAR) 10 MG tablet  Take 1 tablet (10 mg total) by mouth 2 (two) times daily. 180 tablet 0   calcitRIOL (ROCALTROL) 0.25 MCG capsule TAKE ONE CAPSULE BY MOUTH EVERY MORNING AND TAKE ONE CAPSULE BY MOUTH EVERY NIGHT AT BEDTIME 60 capsule 3   Calcium Carbonate-Vitamin D 600-400 MG-UNIT tablet Take by mouth.     fluticasone (FLONASE) 50 MCG/ACT nasal spray Place into the nose.     Lactobacillus (AZO COMPLETE FEMININE BALANCE PO) AZO Complete Feminine Balance     lamoTRIgine (LAMICTAL) 25 MG tablet Take 1 tablet (25 mg total) by mouth 2 (two) times daily. 60 tablet 0   levocetirizine (XYZAL) 5 MG tablet Take 5 mg by mouth every evening.     levothyroxine (SYNTHROID) 50 MCG tablet Take 1 tablet (50 mcg total) by mouth daily. 30 tablet 3   meloxicam (MOBIC) 15 MG tablet Take 1 tablet (15 mg total) by mouth daily. 30 tablet 1   methylPREDNISolone (MEDROL  DOSEPAK) 4 MG TBPK tablet 6 day dose pack - take as directed 21 tablet 0   SPRINTEC 28 0.25-35 MG-MCG tablet      Current Facility-Administered Medications  Medication Dose Route Frequency Provider Last Rate Last Admin   betamethasone acetate-betamethasone sodium phosphate (CELESTONE) injection 3 mg  3 mg Intra-articular Once Felecia ShellingEvans, Brent M, DPM         Musculoskeletal: Strength & Muscle Tone: unable to assess since visit was over the telemedicine. Gait & Station: unable to assess since visit was over the telemedicine. Patient leans: N/A  Psychiatric Specialty Exam: ROSReview of 12 systems negative except as mentioned in HPI   There were no vitals taken for this visit.There is no height or weight on file to calculate BMI.  General Appearance: Casual and Fairly Groomed  Eye Contact:  Good  Speech:  Clear and Coherent and Normal Rate  Volume:  Normal  Mood:  "all over the place"  Affect:  Appropriate, Congruent, and irritable and agitated at times  Thought Process:  Goal Directed and Linear  Orientation:  Full (Time, Place, and Person)  Thought Content:  Logical   Suicidal Thoughts:  No  Homicidal Thoughts:  No  Memory:  Immediate;   Fair Recent;   Fair Remote;   Fair  Judgement:  Good  Insight:  Good  Psychomotor Activity:  Normal  Concentration:  Concentration: Good and Attention Span: Good  Recall:  Good  Fund of Knowledge: Good  Language: Good  Akathisia:  No    AIMS (if indicated): not done  Assets:  Communication Skills Desire for Improvement Financial Resources/Insurance Housing Leisure Time Physical Health Social Support Talents/Skills Transportation Vocational/Educational  ADL's:  Intact  Cognition: WNL  Sleep:  Good   Screenings:   Assessment and Plan:   25 yo F with hx of anxiety disorder on Zoloft since March of 2019 and [facility] [employer].  She is diagnosed with generalized anxiety disorder with panic attacks and has responded well to Zoloft 100 mg once a day and BuSpar 10 mg 2 times a day. However recently has noticed more mood fluctuation, irritability, agitation, depressed mood, sleep changes which initially was thought to be consistent with MDD. However Zoloft and Wellbutrin both worsened her symptoms, and she continues to experience mood lability, irritability, agitation, depressed mood, sleep changes, recently noted self applying to 100 jobs and there is a strong +ve family of bipolar disorder and therefore considering the dx of Bipolar Disorder.  Trialing Latuda as pt's mother and brother has responded well to it.  Also has hx of hypothyroidism and currently receives treatment for it.  She will follow back again in 2 weeks or earlier if needed.    Plan as below:     Plan: Problem 1: Mood - Stop Wellbutrin XL 150 mg daily - Continue with Lamictal 25 mg BID - Start Latuda 20 mg daily with breakfast - Risks and benefits discussed.  - She is recommended individual psychotherapy and recently started seeing therapist through [facility].   Problem 2:  Generalized Anxiety with Panic attack(chronic and stable) Plan:           - Continue Buspar 10 mg BID(takes mostly once a day and twice a day if needed)          -therapy as mentioned above          - Follow up in 2 weeks or early if needed. .Marland Kitchen  MDM = 2 or more chronic condition + 1 unstable condition + med management        [name], MD 03/13/2021, 3:52 PM

## 2021-03-27 ENCOUNTER — Telehealth (INDEPENDENT_AMBULATORY_CARE_PROVIDER_SITE_OTHER): Payer: BC Managed Care – PPO | Admitting: Child and Adolescent Psychiatry

## 2021-03-27 ENCOUNTER — Other Ambulatory Visit: Payer: Self-pay

## 2021-03-27 DIAGNOSIS — F411 Generalized anxiety disorder: Secondary | ICD-10-CM

## 2021-03-27 DIAGNOSIS — F3162 Bipolar disorder, current episode mixed, moderate: Secondary | ICD-10-CM

## 2021-03-27 DIAGNOSIS — F410 Panic disorder [episodic paroxysmal anxiety] without agoraphobia: Secondary | ICD-10-CM | POA: Diagnosis not present

## 2021-03-27 MED ORDER — LAMOTRIGINE 25 MG PO TABS: 25.0000 mg | ORAL_TABLET | Freq: Two times a day (BID) | ORAL | 0 refills | Status: DC

## 2021-03-27 MED ORDER — LATUDA 20 MG PO TABS: 20.0000 mg | ORAL_TABLET | Freq: Every day | ORAL | 0 refills | Status: DC

## 2021-03-27 NOTE — Progress Notes (Signed)
Virtual Visit via Video Note  I connected with Colleen Tapia on 03/27/21 at  8:00 AM EDT by a video enabled telemedicine application and verified that I am speaking with the correct person using two identifiers.  Location: Patient: Home Provider: Office   I discussed the limitations of evaluation and management by telemedicine and the availability of in person appointments. The patient expressed understanding and agreed to proceed.  I discussed the assessment and treatment plan with the patient. The patient was provided an opportunity to ask questions and all were answered. The patient agreed with the plan and demonstrated an understanding of the instructions.   The patient was advised to call back or seek an in-person evaluation if the symptoms worsen or if the condition fails to improve as anticipated.  I provided 30 minutes of non-face-to-face time during this encounter.   [name], MD     [facility] MD/PA/NP OP Progress Note  03/27/2021 8:30 AM Colleen Tapia  MRN:  [id number]  Chief Complaint:  "  I am doing better"(patient)  HPI: This is a 25 year old Caucasian female with hx of generalized anxiety disorder, MDD, panic attacks previously on Zoloft and BuSpar since March 2019 and also tried on Wellbutrin XL which caused more irritability and therefore discontinued and currently prescribed Latuda 20 mg once a day for concerns regarding bipolar disorder and BuSpar for anxiety.   Today she was present by herself and was seen and evaluated over telemedicine encounter for medication management follow-up.  She reports that she has tolerated Latuda well without any side effects.  She reports that she has not noticed any side effects or changes in her appetite.  She reports that she has noticed improvement with her mood, irritability and agitation.  She reports that she has not been as emotionally labile as she was before, she has not been crying for no reason, her mood is not depressed  as it was before, and her anxiety has stayed stable and manageable.  She denies any concerns for today's appointment and reports that she would like to continue with current medications.  She denies any SI/HI, and reports sleeping well, denies any problems with appetite or energy.  She reports that she is back in college and will be graduating this December.  She reports that she is planning to get a job outside of [facility] and therefore has applied to various places all over the [place].  She reports that she has seen her therapist once and has an upcoming appointment.  We discussed to continue with current medications and follow back again in 1 month or earlier if needed.  She verbalized understanding and agreed with the plan.  Visit Diagnosis:    ICD-10-CM   1. Bipolar disorder, current episode mixed, moderate (HCC)  F31.62 lurasidone (LATUDA) 20 MG TABS tablet    lamoTRIgine (LAMICTAL) 25 MG tablet    2. Generalized anxiety disorder with panic attacks  F41.1    F41.0       Past Psychiatric History: As mentioned in initial H&P, reviewed today, no change  Past Medical History:  Past Medical History:  Diagnosis Date   Anxiety     Past Surgical History:  Procedure Laterality Date   MOUTH SURGERY      Family Psychiatric History: As mentioned in initial H&P, reviewed today, no change  Family History:  Family History  Problem Relation Age of Onset   Anxiety disorder Mother    Depression Mother  Social History:  Social History   Socioeconomic History   Marital status: Single    Spouse name: Not on file   Number of children: 0   Years of education: Not on file   Highest education level: Some college, no degree  Occupational History   Not on file  Tobacco Use   Smoking status: Never   Smokeless tobacco: Never  Vaping Use   Vaping Use: Never used  Substance and Sexual Activity   Alcohol use: Yes    Alcohol/week: 3.0 - 5.0 standard drinks    Types: 3 - 4 Shots of  liquor per week   Drug use: Not Currently   Sexual activity: Not Currently  Other Topics Concern   Not on file  Social History Narrative   Not on file   Social Determinants of Health   Financial Resource Strain: Not on file  Food Insecurity: Not on file  Transportation Needs: Not on file  Physical Activity: Not on file  Stress: Not on file  Social Connections: Not on file    Allergies:  Allergies  Allergen Reactions   No Known Allergies     Metabolic Disorder Labs: No results found for: HGBA1C, MPG Lab Results  Component Value Date   PROLACTIN 13.5 02/01/2020   No results found for: CHOL, TRIG, HDL, CHOLHDL, VLDL, LDLCALC Lab Results  Component Value Date   TSH 0.550 01/24/2021   TSH 0.83 10/02/2020    Therapeutic Level Labs: No results found for: LITHIUM No results found for: VALPROATE No components found for:  CBMZ  Current Medications: Current Outpatient Medications  Medication Sig Dispense Refill   busPIRone (BUSPAR) 10 MG tablet Take 1 tablet (10 mg total) by mouth 2 (two) times daily. 180 tablet 0   calcitRIOL (ROCALTROL) 0.25 MCG capsule TAKE ONE CAPSULE BY MOUTH EVERY MORNING AND TAKE ONE CAPSULE BY MOUTH EVERY NIGHT AT BEDTIME 60 capsule 3   Calcium Carbonate-Vitamin D 600-400 MG-UNIT tablet Take by mouth.     fluticasone (FLONASE) 50 MCG/ACT nasal spray Place into the nose.     Lactobacillus (AZO COMPLETE FEMININE BALANCE PO) AZO Complete Feminine Balance     lamoTRIgine (LAMICTAL) 25 MG tablet Take 1 tablet (25 mg total) by mouth 2 (two) times daily. 60 tablet 0   levocetirizine (XYZAL) 5 MG tablet Take 5 mg by mouth every evening.     levothyroxine (SYNTHROID) 50 MCG tablet Take 1 tablet (50 mcg total) by mouth daily. 30 tablet 3   lurasidone (LATUDA) 20 MG TABS tablet Take 1 tablet (20 mg total) by mouth daily with breakfast. 30 tablet 0   meloxicam (MOBIC) 15 MG tablet Take 1 tablet (15 mg total) by mouth daily. 30 tablet 1   methylPREDNISolone  (MEDROL DOSEPAK) 4 MG TBPK tablet 6 day dose pack - take as directed 21 tablet 0   SPRINTEC 28 0.25-35 MG-MCG tablet      Current Facility-Administered Medications  Medication Dose Route Frequency Provider Last Rate Last Admin   betamethasone acetate-betamethasone sodium phosphate (CELESTONE) injection 3 mg  3 mg Intra-articular Once Felecia Shelling, DPM         Musculoskeletal: Strength & Muscle Tone: unable to assess since visit was over the telemedicine. Gait & Station: unable to assess since visit was over the telemedicine. Patient leans: N/A  Psychiatric Specialty Exam: ROSReview of 12 systems negative except as mentioned in HPI   There were no vitals taken for this visit.There is no height or weight on file  to calculate BMI.  General Appearance: Casual and Fairly Groomed  Eye Contact:  Good  Speech:  Clear and Coherent and Normal Rate  Volume:  Normal  Mood:  "good"  Affect:  Appropriate, Congruent, and Full Range  Thought Process:  Goal Directed and Linear  Orientation:  Full (Time, Place, and Person)  Thought Content: Logical   Suicidal Thoughts:  No  Homicidal Thoughts:  No  Memory:  Immediate;   Fair Recent;   Fair Remote;   Fair  Judgement:  Good  Insight:  Good  Psychomotor Activity:  Normal  Concentration:  Concentration: Good and Attention Span: Good  Recall:  Good  Fund of Knowledge: Good  Language: Good  Akathisia:  No    AIMS (if indicated): not done  Assets:  Communication Skills Desire for Improvement Financial Resources/Insurance Housing Leisure Time Physical Health Social Support Talents/Skills Transportation Vocational/Educational  ADL's:  Intact  Cognition: WNL  Sleep:  Good   Screenings:   Assessment and Plan:   25 yo F with hx of anxiety disorder on Zoloft since March of 2019 and was previously in counseling at [facility].  She is diagnosed with generalized anxiety disorder with panic attacks and has  responded well to Zoloft 100 mg once a day and BuSpar 10 mg 2 times a day. However recently has noticed more mood fluctuation, irritability, agitation, depressed mood, sleep changes which initially was thought to be consistent with MDD. However Zoloft and Wellbutrin both worsened her symptoms, and she continues to experience mood lability, irritability, agitation, depressed mood, sleep changes, recently noted self applying to 100 jobs and there is a strong +ve family of bipolar disorder and therefore considering the dx of Bipolar Disorder.  She was started on Latuda as pt's mother and brother has responded well to it at her last appointment appears to have noted improvement in mood, irritability, agitation, improvement in sleep, therefore recommending to continue with current meds.  Also has hx of hypothyroidism and currently receives treatment for it.  She will follow back again in 4 weeks or earlier if needed.    Plan as below:     Plan: Problem 1: Mood - Continue with Lamictal 25 mg BID - Continue with Latuda 20 mg daily with breakfast - Risks and benefits discussed.  - She is recommended individual psychotherapy and recently started seeing therapist through [facility].   Problem 2: Generalized Anxiety with Panic attack(chronic and stable) Plan:           - Continue Buspar 10 mg BID          -therapy as mentioned above          - Follow up in 4 weeks or early if needed. .         MDM = 2 or more chronic stable condition + med management        [name], MD 03/27/2021, 8:30 AM

## 2021-04-11 ENCOUNTER — Other Ambulatory Visit: Payer: Self-pay | Admitting: Endocrinology

## 2021-04-22 ENCOUNTER — Telehealth: Payer: Self-pay | Admitting: Endocrinology

## 2021-04-22 NOTE — Telephone Encounter (Signed)
Patient called to request for Lab Orders be faxed today to [facility] in [place] Fax# [phone] for Patient's prior labs (Dr. Lucianne Muss Appt is 04/26/21

## 2021-04-23 ENCOUNTER — Other Ambulatory Visit: Payer: Self-pay

## 2021-04-23 DIAGNOSIS — E038 Other specified hypothyroidism: Secondary | ICD-10-CM

## 2021-04-23 DIAGNOSIS — E201 Pseudohypoparathyroidism: Secondary | ICD-10-CM

## 2021-04-24 LAB — BASIC METABOLIC PANEL
BUN/Creatinine Ratio: 9 (ref 9–23)
BUN: 7 mg/dL (ref 6–20)
CO2: 20 mmol/L (ref 20–29)
Calcium: 9 mg/dL (ref 8.7–10.2)
Chloride: 100 mmol/L (ref 96–106)
Creatinine, Ser: 0.74 mg/dL (ref 0.57–1.00)
Glucose: 77 mg/dL (ref 70–99)
Potassium: 5.1 mmol/L (ref 3.5–5.2)
Sodium: 137 mmol/L (ref 134–144)
eGFR: 117 mL/min/{1.73_m2} (ref >59)

## 2021-04-24 LAB — T4, FREE: Free T4: 1.06 ng/dL (ref 0.82–1.77)

## 2021-04-24 LAB — TSH: TSH: 3.7 u[IU]/mL (ref 0.450–4.500)

## 2021-04-26 ENCOUNTER — Ambulatory Visit: Payer: BC Managed Care – PPO | Admitting: Endocrinology

## 2021-05-01 ENCOUNTER — Telehealth (INDEPENDENT_AMBULATORY_CARE_PROVIDER_SITE_OTHER): Payer: BC Managed Care – PPO | Admitting: Child and Adolescent Psychiatry

## 2021-05-01 ENCOUNTER — Encounter: Payer: Self-pay | Admitting: Child and Adolescent Psychiatry

## 2021-05-01 ENCOUNTER — Other Ambulatory Visit: Payer: Self-pay

## 2021-05-01 ENCOUNTER — Telehealth: Payer: BC Managed Care – PPO | Admitting: Child and Adolescent Psychiatry

## 2021-05-01 DIAGNOSIS — F3162 Bipolar disorder, current episode mixed, moderate: Secondary | ICD-10-CM | POA: Diagnosis not present

## 2021-05-01 DIAGNOSIS — F411 Generalized anxiety disorder: Secondary | ICD-10-CM | POA: Diagnosis not present

## 2021-05-01 DIAGNOSIS — F410 Panic disorder [episodic paroxysmal anxiety] without agoraphobia: Secondary | ICD-10-CM | POA: Diagnosis not present

## 2021-05-01 MED ORDER — BUSPIRONE HCL 15 MG PO TABS: 15.0000 mg | ORAL_TABLET | Freq: Two times a day (BID) | ORAL | 0 refills | Status: DC

## 2021-05-01 MED ORDER — LATUDA 20 MG PO TABS: 20.0000 mg | ORAL_TABLET | Freq: Every day | ORAL | 0 refills | Status: DC

## 2021-05-01 MED ORDER — TRAZODONE HCL 50 MG PO TABS
50.0000 mg | ORAL_TABLET | Freq: Every day | ORAL | 0 refills | Status: DC
Start: 2021-05-01 — End: 2021-05-14

## 2021-05-01 MED ORDER — LAMOTRIGINE 25 MG PO TABS: 25.0000 mg | ORAL_TABLET | Freq: Two times a day (BID) | ORAL | 0 refills | Status: DC

## 2021-05-01 NOTE — Progress Notes (Signed)
Virtual Visit via Video Note  I connected with Colleen Tapia on 05/01/21 at  9:30 AM EDT by a video enabled telemedicine application and verified that I am speaking with the correct person using two identifiers.  Location: Patient: Home Provider: Office   I discussed the limitations of evaluation and management by telemedicine and the availability of in person appointments. The patient expressed understanding and agreed to proceed.  I discussed the assessment and treatment plan with the patient. The patient was provided an opportunity to ask questions and all were answered. The patient agreed with the plan and demonstrated an understanding of the instructions.   The patient was advised to call back or seek an in-person evaluation if the symptoms worsen or if the condition fails to improve as anticipated.  I provided 18 minutes of non-face-to-face time during this encounter.   [name], MD     [facility] MD/PA/NP OP Progress Note  05/01/2021 9:48 AM Colleen Tapia  MRN:  [id number]  Chief Complaint:  "I am exhausted"(pt).  HPI: This is a 25 year old Caucasian female with hx of generalized anxiety disorder, MDD, panic attacks previously on Zoloft and BuSpar since March 2019 and also tried on Wellbutrin XL which caused more irritability and therefore discontinued and currently prescribed Latuda 20 mg once a day and Lamictal 25 mg BID for concerns regarding bipolar disorder and BuSpar for anxiety.   Today she was present by herself and was seen and evaluated over telemedicine encounter for medication management follow-up.  She reports that she is currently on a fall break and visiting her parents.  She reports that she has been feeling exhausted because she has not been able to sleep well since last 2 to 4 weeks.  She reports that she has been overthinking and feeling anxious about everything related to her life(job, future) and that keeps her from falling asleep or staying asleep.  She  also reports that one of the girl from her college whom she knew recently died in a car accident that has increased her anxiety even more.  She reports that her anxiety is around 6 or 7 out of 10, 10 being the most anxious.  In regards of mood she reports that her mood has been stable, denies having any periods of agitation or irritability or low lows as she used to have before.  She denies any suicidal thoughts or homicidal thoughts.  She reports that she has been eating well.  We discussed to increase the dose of BuSpar to 15 mg twice a day for anxiety and start trazodone 50 mg at night for sleep.  We discussed to continue rest of the current medications.  She verbalized understanding and agreed with the plan.  She reports that she will be graduating in December however still not able to secure job for herself.  Supportive counseling was provided and discussed to continue the patient and apply for jobs.  She was receptive.  She reports that she has not been able to see her therapist regularly except once a month due to therapist's full schedule.  We discussed that once she has understanding of where she will be after graduation she can start looking for therapist around that area and follow up with them regularly.  She will follow back with me in 4 weeks or earlier if needed.  Visit Diagnosis:    ICD-10-CM   1. Generalized anxiety disorder with panic attacks  F41.1 busPIRone (BUSPAR) 15 MG tablet   F41.0  2. Bipolar disorder, current episode mixed, moderate (HCC)  F31.62 lurasidone (LATUDA) 20 MG TABS tablet    lamoTRIgine (LAMICTAL) 25 MG tablet      Past Psychiatric History: As mentioned in initial H&P,and in synopsis, reviewed today.   Past Medical History:  Past Medical History:  Diagnosis Date   Anxiety     Past Surgical History:  Procedure Laterality Date   MOUTH SURGERY      Family Psychiatric History: As mentioned in initial H&P, reviewed today, no change  Family History:   Family History  Problem Relation Age of Onset   Anxiety disorder Mother    Depression Mother     Social History:  Social History   Socioeconomic History   Marital status: Single    Spouse name: Not on file   Number of children: 0   Years of education: Not on file   Highest education level: Some college, no degree  Occupational History   Not on file  Tobacco Use   Smoking status: Never   Smokeless tobacco: Never  Vaping Use   Vaping Use: Never used  Substance and Sexual Activity   Alcohol use: Yes    Alcohol/week: 3.0 - 5.0 standard drinks    Types: 3 - 4 Shots of liquor per week   Drug use: Not Currently   Sexual activity: Not Currently  Other Topics Concern   Not on file  Social History Narrative   Not on file   Social Determinants of Health   Financial Resource Strain: Not on file  Food Insecurity: Not on file  Transportation Needs: Not on file  Physical Activity: Not on file  Stress: Not on file  Social Connections: Not on file    Allergies:  Allergies  Allergen Reactions   No Known Allergies     Metabolic Disorder Labs: No results found for: HGBA1C, MPG Lab Results  Component Value Date   PROLACTIN 13.5 02/01/2020   No results found for: CHOL, TRIG, HDL, CHOLHDL, VLDL, LDLCALC Lab Results  Component Value Date   TSH 3.700 04/23/2021   TSH 0.550 01/24/2021    Therapeutic Level Labs: No results found for: LITHIUM No results found for: VALPROATE No components found for:  CBMZ  Current Medications: Current Outpatient Medications  Medication Sig Dispense Refill   meloxicam (MOBIC) 15 MG tablet Take 1 tablet by mouth daily.     norgestimate-ethinyl estradiol (SPRINTEC 28) 0.25-35 MG-MCG tablet Take 1 tablet by mouth daily.     busPIRone (BUSPAR) 15 MG tablet Take 1 tablet (15 mg total) by mouth 2 (two) times daily. 60 tablet 0   calcitRIOL (ROCALTROL) 0.25 MCG capsule TAKE 1 CAPSULE BY MOUTH EVERY MORNING AND 1 CAPSULE BY MOUTH EACH NIGHT AT  BEDTIME 60 capsule 3   Calcium Carbonate-Vitamin D 600-400 MG-UNIT tablet Take by mouth.     fluticasone (FLONASE) 50 MCG/ACT nasal spray Place into the nose.     Lactobacillus (AZO COMPLETE FEMININE BALANCE PO) AZO Complete Feminine Balance     lamoTRIgine (LAMICTAL) 25 MG tablet Take 1 tablet (25 mg total) by mouth 2 (two) times daily. 60 tablet 0   levocetirizine (XYZAL) 5 MG tablet Take 5 mg by mouth every evening.     levothyroxine (SYNTHROID) 50 MCG tablet Take 1 tablet (50 mcg total) by mouth daily. 30 tablet 3   lurasidone (LATUDA) 20 MG TABS tablet Take 1 tablet (20 mg total) by mouth daily with breakfast. 30 tablet 0   meloxicam (MOBIC) 15 MG  tablet Take 1 tablet (15 mg total) by mouth daily. 30 tablet 1   Current Facility-Administered Medications  Medication Dose Route Frequency Provider Last Rate Last Admin   betamethasone acetate-betamethasone sodium phosphate (CELESTONE) injection 3 mg  3 mg Intra-articular Once Felecia Shelling, DPM         Musculoskeletal: Strength & Muscle Tone: unable to assess since visit was over the telemedicine. Gait & Station: unable to assess since visit was over the telemedicine. Patient leans: N/A  Psychiatric Specialty Exam: ROSReview of 12 systems negative except as mentioned in HPI   There were no vitals taken for this visit.There is no height or weight on file to calculate BMI.  General Appearance: Casual and Fairly Groomed  Eye Contact:  Good  Speech:  Clear and Coherent and Normal Rate  Volume:  Normal  Mood:  "exhausted"  Affect:  Appropriate, Congruent, and Restricted  Thought Process:  Goal Directed and Linear  Orientation:  Full (Time, Place, and Person)  Thought Content: Logical   Suicidal Thoughts:  No  Homicidal Thoughts:  No  Memory:  Immediate;   Fair Recent;   Fair Remote;   Fair  Judgement:  Good  Insight:  Good  Psychomotor Activity:  Normal  Concentration:  Concentration: Good and Attention Span: Good  Recall:   Good  Fund of Knowledge: Good  Language: Good  Akathisia:  No    AIMS (if indicated): not done  Assets:  Communication Skills Desire for Improvement Financial Resources/Insurance Housing Leisure Time Physical Health Social Support Talents/Skills Transportation Vocational/Educational  ADL's:  Intact  Cognition: WNL  Sleep:  Poor   Screenings:   Assessment and Plan:   25 yo F with hx of anxiety disorder on Zoloft since March of 2019 and was previously in counseling at [facility].  She is diagnosed with generalized anxiety disorder with panic attacks and has responded well to Zoloft 100 mg once a day and BuSpar 10 mg 2 times a day. However recently has noticed more mood fluctuation, irritability, agitation, depressed mood, sleep changes which initially was thought to be consistent with MDD. However Zoloft and Wellbutrin both worsened her symptoms, and she continued to experience mood lability, irritability, agitation, depressed mood, sleep changes, recently noted self applying to 100 jobs and there is a strong +ve family of bipolar disorder and therefore considering the dx of Bipolar Disorder.  She was started on Latuda as pt's mother and brother has responded well to it. She appears to have continued improvement in mood,  irritability, agitation, however noticed worsening of anxiety and sleep in the context of current psychosocial stressors(not getting a job yet, overthinking about her future and relationships). Recommended to increase Buspar to 15 mg BID for anxiety and start Trazodone for sleep. Also has hx of hypothyroidism and currently receives treatment for it.  She will follow back again in 4 weeks or earlier if needed.    Plan as below:     Plan: Problem 1: Mood(chronic and stable) - Continue with Lamictal 25 mg BID - Continue with Latuda 20 mg daily with breakfast - Risks and benefits discussed.  - She is recommended individual psychotherapy and  recently started seeing therapist through [facility].   Problem 2: Generalized Anxiety with Panic attack(chronic and unstable) Plan:           - Increase Buspar to 15 mg BID          -therapy as mentioned above          -  Follow up in 4 weeks or early if needed. .   Problem 3: Sleep Plan:  - Start Trazodone 50 mg QHS for sleep.         MDM = 2 or more chronic stable/unstable condition + med management        [name], MD 05/01/2021, 9:48 AM

## 2021-05-02 ENCOUNTER — Telehealth (INDEPENDENT_AMBULATORY_CARE_PROVIDER_SITE_OTHER): Payer: BC Managed Care – PPO | Admitting: Endocrinology

## 2021-05-02 VITALS — Ht 65.0 in | Wt 213.0 lb

## 2021-05-02 DIAGNOSIS — Z6835 Body mass index (BMI) 35.0-35.9, adult: Secondary | ICD-10-CM | POA: Diagnosis not present

## 2021-05-02 DIAGNOSIS — E038 Other specified hypothyroidism: Secondary | ICD-10-CM

## 2021-05-02 DIAGNOSIS — E201 Pseudohypoparathyroidism: Secondary | ICD-10-CM | POA: Diagnosis not present

## 2021-05-02 MED ORDER — LEVOTHYROXINE SODIUM 50 MCG PO TABS
50.0000 ug | ORAL_TABLET | Freq: Every day | ORAL | 2 refills | Status: DC
Start: 2021-05-02 — End: 2022-01-24

## 2021-05-02 NOTE — Progress Notes (Signed)
Patient ID: Colleen Tapia, female   DOB: August 08, 1995, 25 y.o.   MRN: [id number]          I connected with the above-named patient by video enabled telemedicine application and verified that I am speaking with the correct person. The patient was explained the limitations of evaluation and management by telemedicine and the availability of in person appointments.  Patient also understood that there may be a patient responsible charge related to this service  Location of the patient: Patient's home  Location of the provider: Physician office Only the patient and myself were participating in the encounter The patient understood the above statements and agreed to proceed.   Chief complaint: Endocrinology follow-up  History of Present Illness:   Background history: She went for routine visit with her PCP and was found to have a calcium was 6.9 in June 2021 She thinks that her calcium probably had been low previously also Ionized calcium was also low at 3.7, normal >4.5 PTH level 384  RECENT HISTORY:  She has been diagnosed to have pseudohypoparathyroidism  Her pretreatment symptoms were tightness and cramping in her fingers, at times also a tightness in her face making it difficult to talk She was also complaining of feeling tired and having headaches   Calcium levels have been consistently normal with taking CALCITRIOL 0.25 mcg twice daily She takes her medications with the help of pillbox every day regularly Has been continued on calcium supplements, using Os-Cal but takes it only in the evening  Has not had any further symptoms of muscle tightening in hands or face and no cramping in muscles Calcium is consistently normal and at 9.0  Lab Results  Component Value Date   CALCIUM 9.0 04/23/2021   CALCIUM 8.9 01/24/2021   CALCIUM 8.7 10/02/2020   CALCIUM 8.5 07/13/2020   CALCIUM 8.1 (L) 03/05/2020   CALCIUM 7.6 (L) 02/01/2020   Lab Results  Component Value Date   CALCIUM 9.0  04/23/2021   PHOS 4.2 07/13/2020    Other problems addressed today: See review of systems   Past Medical History:  Diagnosis Date   Anxiety     Past Surgical History:  Procedure Laterality Date   MOUTH SURGERY      Family History  Problem Relation Age of Onset   Anxiety disorder Mother    Depression Mother     Social History:  reports that she has never smoked. She has never used smokeless tobacco. She reports current alcohol use of about 3.0 - 5.0 standard drinks per week. She reports that she does not currently use drugs.  Allergies:  Allergies  Allergen Reactions   No Known Allergies     Allergies as of 05/02/2021       Reactions   No Known Allergies         Medication List        Accurate as of May 02, 2021  8:11 AM. If you have any questions, ask your nurse or doctor.          AZO COMPLETE FEMININE BALANCE PO AZO Complete Feminine Balance   busPIRone 15 MG tablet Commonly known as: BUSPAR Take 1 tablet (15 mg total) by mouth 2 (two) times daily.   calcitRIOL 0.25 MCG capsule Commonly known as: ROCALTROL TAKE 1 CAPSULE BY MOUTH EVERY MORNING AND 1 CAPSULE BY MOUTH EACH NIGHT AT BEDTIME   Calcium Carbonate-Vitamin D 600-400 MG-UNIT tablet Take by mouth.   fluticasone 50 MCG/ACT nasal spray Commonly known  as: FLONASE Place into the nose.   lamoTRIgine 25 MG tablet Commonly known as: LAMICTAL Take 1 tablet (25 mg total) by mouth 2 (two) times daily.   Latuda 20 MG Tabs tablet Generic drug: lurasidone Take 1 tablet (20 mg total) by mouth daily with breakfast.   levocetirizine 5 MG tablet Commonly known as: XYZAL Take 5 mg by mouth every evening.   levothyroxine 50 MCG tablet Commonly known as: SYNTHROID Take 1 tablet (50 mcg total) by mouth daily.   meloxicam 15 MG tablet Commonly known as: MOBIC Take 1 tablet (15 mg total) by mouth daily.   meloxicam 15 MG tablet Commonly known as: MOBIC Take 1 tablet by mouth daily.    Sprintec 28 0.25-35 MG-MCG tablet Generic drug: norgestimate-ethinyl estradiol Take 1 tablet by mouth daily.   traZODone 50 MG tablet Commonly known as: DESYREL Take 1 tablet (50 mg total) by mouth at bedtime.           Review of Systems  HYPOTHYROIDISM:   She had been complaining of fatigue and weight gain but no cold intolerance at her initial visit  Had a slightly low baseline free T4 level of 0.59 With levothyroxine supplements she had less fatigue  She was feeling hot and sweaty on her last visit and her free T4 was relatively higher at 1.4  Her levothyroxine dose was decreased to 50 mcg in 7/22 Since her dosage reduction she has not had as much heat intolerance  She takes her levothyroxine before breakfast daily  Thyroid levels as follows  Lab Results  Component Value Date   TSH 3.700 04/23/2021   TSH 0.550 01/24/2021   TSH 0.83 10/02/2020   FREET4 1.06 04/23/2021   FREET4 1.43 01/24/2021   FREET4 0.71 10/02/2020     She says she has difficulty losing weight but not clear what her weight is recently  Wt Readings from Last 3 Encounters:  01/29/21 212 lb 6.4 oz (96.3 kg)  10/04/20 213 lb (96.6 kg)  07/16/20 216 lb 3.2 oz (98.1 kg)   OLIGOMENORRHEA: This has been treated with birth control pills, no pretreatment estradiol levels available  Prolactin level was normal  She had an increased blood pressure on her last visit but she has not followed up or checked her blood pressure at home  BP Readings from Last 3 Encounters:  01/29/21 (!) 130/92  07/16/20 138/88  03/07/20 104/70      PHYSICAL EXAM:  There were no vitals taken for this visit.     ASSESSMENT:   PSEUDOHYPOPARATHYROIDISM, familial  She had mild baseline symptoms of tightness in her hands and face and cramping with significantly long baseline calcium of 6.9 Symptoms have resolved  She is again taking 0.25 mcg twice daily with calcium supplement once a day in the  evening Calcium is again normal at about the same Renal function normal   HYPOTHYROIDISM: She has secondary hypothyroidism with mildly low free T4 level at baseline Although she was doing well with 100 mcg levothyroxine she is now requiring only 50 mcg, previously was possibly having more anxiety and heat intolerance with the higher dose.  Free T4 back to the therapeutic level of 1.06  She continues to be on birth control pill for menstrual cycle regulation  Increased blood pressure: This was unchanged on second measurement-unclear if this is related to anxiety Difficulty with weight loss: This may be related to genetic factors, history of depression and antidepressants as well as disordered sleep  PLAN:   Continue calcitriol 0.25 mcg twice daily Take calcium supplements twice daily consistently, can take the morning dose at lunchtime or sometime after breakfast No change in levothyroxine  To check blood pressure at home monitor with her other physicians Encouraged her to start exercise when able to Also advised her to take only half a tablet of 15 mg meloxicam because of her high normal potassium level     Lacey Dotson 05/02/2021, 8:11 AM

## 2021-05-13 ENCOUNTER — Telehealth: Payer: Self-pay

## 2021-05-13 NOTE — Telephone Encounter (Signed)
pt called left message that she was very hyperemotional and wonders if the medication was causing.

## 2021-05-13 NOTE — Telephone Encounter (Signed)
I spoke with her. She is scheduled to see me tomorrow.

## 2021-05-14 ENCOUNTER — Telehealth (INDEPENDENT_AMBULATORY_CARE_PROVIDER_SITE_OTHER): Payer: BC Managed Care – PPO | Admitting: Child and Adolescent Psychiatry

## 2021-05-14 ENCOUNTER — Other Ambulatory Visit: Payer: Self-pay

## 2021-05-14 DIAGNOSIS — F3162 Bipolar disorder, current episode mixed, moderate: Secondary | ICD-10-CM

## 2021-05-14 DIAGNOSIS — F411 Generalized anxiety disorder: Secondary | ICD-10-CM

## 2021-05-14 MED ORDER — TRAZODONE HCL 50 MG PO TABS: 75.0000 mg | ORAL_TABLET | Freq: Every day | ORAL | 0 refills | Status: DC

## 2021-05-14 MED ORDER — LURASIDONE HCL 40 MG PO TABS
40.0000 mg | ORAL_TABLET | Freq: Every day | ORAL | 0 refills | Status: DC
Start: 2021-05-14 — End: 2021-05-28

## 2021-05-14 NOTE — Progress Notes (Signed)
Virtual Visit via Video Note  I connected with Colleen Tapia on 05/14/21 at 11:00 AM EDT by a video enabled telemedicine application and verified that I am speaking with the correct person using two identifiers.  Location: Patient: Home Provider: Office   I discussed the limitations of evaluation and management by telemedicine and the availability of in person appointments. The patient expressed understanding and agreed to proceed.  I discussed the assessment and treatment plan with the patient. The patient was provided an opportunity to ask questions and all were answered. The patient agreed with the plan and demonstrated an understanding of the instructions.   The patient was advised to call back or seek an in-person evaluation if the symptoms worsen or if the condition fails to improve as anticipated.  I provided 30 minutes of non-face-to-face time during this encounter.   [name], MD     [facility] MD/PA/NP OP Progress Note  05/14/2021 11:35 AM Colleen Tapia  MRN:  [id number]  Chief Complaint:  "very emotional"(pt).  HPI: This is a 25 year old Caucasian female with hx of generalized anxiety disorder, MDD, panic attacks previously on Zoloft and BuSpar since March 2019 and also tried on Wellbutrin XL which caused more irritability and therefore discontinued and currently prescribed Latuda 20 mg once a day and Lamictal 25 mg BID for concerns regarding bipolar disorder and BuSpar for anxiety.   Pt called yesterday and reported that she has been feeling "very emotional".  She was recommended to make an appointment today for evaluation and discussion of treatment recommendations.  Today she reports that she has been feeling "very emotional" since last 1-1/2 weeks.  She reports that she had her menstrual cycle last week but she has been feeling this way prior to that and previously during her menstrual cycle she did not have this level of mood problems.  She reports that she has  been very tearful for very small things, and sad.  When asked if there were any changes since the last time I saw her about 2 weeks ago she states "I do not know".  She did report that she has a lot of school work to do, does not feel like to go to school which appears to be in the context of the school work and also continues to remain stressed about her future.  She reports that she has not heard back regarding her job applications and has not had any interviews since last appointment.  She reports that her parents have told her not to worry however it is difficult for her to not to think about it.  Additionally she reports that she has not been sleeping well despite taking trazodone, tries to get about 8 hours of sleep, takes about 1 hour to fall asleep and has difficulty sustaining sleep.  Denies any suicidal thoughts or homicidal thoughts.  Reports that she is eating well, reports poor energy.  We discussed that her difficulties with mood appears to be in the context of her recent psychosocial stressors.  We discussed that BuSpar was increased about 2 weeks ago and would recommend to wait before deciding whether to adjust the dose or discontinuing it.  We discussed to increase the dose of Latuda in the meantime to help with mood regulation.  She verbalized understanding and agreed with the plan.  She will follow back again in 2 weeks as scheduled in November.  She reports that she is not able [facility] because  she is far booked out and does not have any other resources for therapy.  Visit Diagnosis:    ICD-10-CM   1. Generalized anxiety disorder  F41.1     2. Bipolar disorder, current episode mixed, moderate (HCC)  F31.62       Past Psychiatric History: As mentioned in initial H&P,and in synopsis, reviewed today.   Past Medical History:  Past Medical History:  Diagnosis Date   Anxiety     Past Surgical History:  Procedure Laterality Date   MOUTH SURGERY       Family Psychiatric History: As mentioned in initial H&P, reviewed today, no change  Family History:  Family History  Problem Relation Age of Onset   Anxiety disorder Mother    Depression Mother     Social History:  Social History   Socioeconomic History   Marital status: Single    Spouse name: Not on file   Number of children: 0   Years of education: Not on file   Highest education level: Some college, no degree  Occupational History   Not on file  Tobacco Use   Smoking status: Never   Smokeless tobacco: Never  Vaping Use   Vaping Use: Never used  Substance and Sexual Activity   Alcohol use: Yes    Alcohol/week: 3.0 - 5.0 standard drinks    Types: 3 - 4 Shots of liquor per week   Drug use: Not Currently   Sexual activity: Not Currently  Other Topics Concern   Not on file  Social History Narrative   Not on file   Social Determinants of Health   Financial Resource Strain: Not on file  Food Insecurity: Not on file  Transportation Needs: Not on file  Physical Activity: Not on file  Stress: Not on file  Social Connections: Not on file    Allergies:  Allergies  Allergen Reactions   No Known Allergies     Metabolic Disorder Labs: No results found for: HGBA1C, MPG Lab Results  Component Value Date   PROLACTIN 13.5 02/01/2020   No results found for: CHOL, TRIG, HDL, CHOLHDL, VLDL, LDLCALC Lab Results  Component Value Date   TSH 3.700 04/23/2021   TSH 0.550 01/24/2021    Therapeutic Level Labs: No results found for: LITHIUM No results found for: VALPROATE No components found for:  CBMZ  Current Medications: Current Outpatient Medications  Medication Sig Dispense Refill   lurasidone (LATUDA) 40 MG TABS tablet Take 1 tablet (40 mg total) by mouth daily with breakfast. 30 tablet 0   busPIRone (BUSPAR) 15 MG tablet Take 1 tablet (15 mg total) by mouth 2 (two) times daily. 60 tablet 0   calcitRIOL (ROCALTROL) 0.25 MCG capsule TAKE 1 CAPSULE BY MOUTH  EVERY MORNING AND 1 CAPSULE BY MOUTH EACH NIGHT AT BEDTIME 60 capsule 3   Calcium Carbonate-Vitamin D 600-400 MG-UNIT tablet Take by mouth.     fluticasone (FLONASE) 50 MCG/ACT nasal spray Place into the nose.     Lactobacillus (AZO COMPLETE FEMININE BALANCE PO) AZO Complete Feminine Balance     lamoTRIgine (LAMICTAL) 25 MG tablet Take 1 tablet (25 mg total) by mouth 2 (two) times daily. 60 tablet 0   levocetirizine (XYZAL) 5 MG tablet Take 5 mg by mouth every evening.     levothyroxine (SYNTHROID) 50 MCG tablet Take 1 tablet (50 mcg total) by mouth daily. 90 tablet 2   lurasidone (LATUDA) 20 MG TABS tablet Take 1 tablet (20 mg total) by mouth daily with  breakfast. 30 tablet 0   meloxicam (MOBIC) 15 MG tablet Take 1 tablet (15 mg total) by mouth daily. 30 tablet 1   meloxicam (MOBIC) 15 MG tablet Take 1 tablet by mouth daily.     norgestimate-ethinyl estradiol (ORTHO-CYCLEN) 0.25-35 MG-MCG tablet Take 1 tablet by mouth daily.     traZODone (DESYREL) 50 MG tablet Take 1.5 tablets (75 mg total) by mouth at bedtime. 45 tablet 0   Current Facility-Administered Medications  Medication Dose Route Frequency Provider Last Rate Last Admin   betamethasone acetate-betamethasone sodium phosphate (CELESTONE) injection 3 mg  3 mg Intra-articular Once Felecia Shelling, DPM         Musculoskeletal: Strength & Muscle Tone: unable to assess since visit was over the telemedicine. Gait & Station: unable to assess since visit was over the telemedicine. Patient leans: N/A  Psychiatric Specialty Exam: ROSReview of 12 systems negative except as mentioned in HPI   There were no vitals taken for this visit.There is no height or weight on file to calculate BMI.  General Appearance: Casual and Fairly Groomed  Eye Contact:  Good  Speech:  Clear and Coherent and Normal Rate  Volume:  Normal  Mood:  "very emotional"  Affect:  Appropriate, Congruent, and Restricted  Thought Process:  Goal Directed and Linear   Orientation:  Full (Time, Place, and Person)  Thought Content: Logical   Suicidal Thoughts:  No  Homicidal Thoughts:  No  Memory:  Immediate;   Fair Recent;   Fair Remote;   Fair  Judgement:  Fair  Insight:  Fair  Psychomotor Activity:  Normal  Concentration:  Concentration: Good and Attention Span: Good  Recall:  Good  Fund of Knowledge: Good  Language: Good  Akathisia:  No    AIMS (if indicated): not done  Assets:  Communication Skills Desire for Improvement Financial Resources/Insurance Housing Leisure Time Physical Health Social Support Talents/Skills Transportation Vocational/Educational  ADL's:  Intact  Cognition: WNL  Sleep:  Poor   Screenings:   Assessment and Plan:   25 yo F with hx of anxiety disorder on Zoloft since March of 2019 and was previously in counseling at [facility] [employer].  She is diagnosed with generalized anxiety disorder with panic attacks and has responded well to Zoloft 100 mg once a day and BuSpar 10 mg 2 times a day. However recently has noticed more mood fluctuation, irritability, agitation, depressed mood, sleep changes which initially was thought to be consistent with MDD. However Zoloft and Wellbutrin both worsened her symptoms, and she continued to experience mood lability, irritability, agitation, depressed mood, sleep changes, recently noted self applying to 100 jobs and there is a strong +ve family of bipolar disorder and therefore considering the dx of Bipolar Disorder.  She was started on Latuda as pt's mother and brother has responded well to it.   At her last appointment about 2 weeks ago, she reported worsening of anxiety and sleep in the context of current psychosocial stressors(not getting a job yet, overthinking about her future and relationships). She reports that she is more tearful, sad, has lack of motivation attending school, problems with sleep and energy, which appears to be most likely in the context  of her ongoing psychosocial stressors. Buspar was increased 2 weeks ago and recommended to wait to assess the response. Recommended to increase Latuda to 40 mg daily and follow back in 2 weeks or earlier if needed.     Plan as below:  Plan: Problem 1: Mood(chronic and stable) - Continue with Lamictal 25 mg BID - Increase Latuda to 40 mg daily with breakfast - Risks and benefits discussed.  - She is recommended individual psychotherapy and recently started seeing therapist through [facility] however therapist is booked out and not able to see other therapist.   Problem 2: Generalized Anxiety with Panic attack(chronic and unstable) Plan:           - Continue with Buspar 15 mg BID          - Follow up in 4 weeks or early if needed. .   Problem 3: Sleep Plan:  - Increase Trazodone to 75  mg QHS for sleep.         MDM = 2 or more chronic stable/unstable condition + med management        [name], MD 05/14/2021, 11:35 AM

## 2021-05-15 ENCOUNTER — Ambulatory Visit: Payer: BC Managed Care – PPO | Admitting: Child and Adolescent Psychiatry

## 2021-05-28 ENCOUNTER — Other Ambulatory Visit: Payer: Self-pay

## 2021-05-28 ENCOUNTER — Telehealth (INDEPENDENT_AMBULATORY_CARE_PROVIDER_SITE_OTHER): Payer: BC Managed Care – PPO | Admitting: Child and Adolescent Psychiatry

## 2021-05-28 ENCOUNTER — Encounter: Payer: Self-pay | Admitting: Child and Adolescent Psychiatry

## 2021-05-28 DIAGNOSIS — F3162 Bipolar disorder, current episode mixed, moderate: Secondary | ICD-10-CM | POA: Diagnosis not present

## 2021-05-28 DIAGNOSIS — F410 Panic disorder [episodic paroxysmal anxiety] without agoraphobia: Secondary | ICD-10-CM

## 2021-05-28 DIAGNOSIS — F411 Generalized anxiety disorder: Secondary | ICD-10-CM

## 2021-05-28 MED ORDER — LURASIDONE HCL 40 MG PO TABS: 40.0000 mg | ORAL_TABLET | Freq: Every day | ORAL | 0 refills | Status: DC

## 2021-05-28 MED ORDER — LAMOTRIGINE 25 MG PO TABS: 25.0000 mg | ORAL_TABLET | Freq: Two times a day (BID) | ORAL | 0 refills | Status: DC

## 2021-05-28 MED ORDER — BUSPIRONE HCL 10 MG PO TABS: 20.0000 mg | ORAL_TABLET | Freq: Two times a day (BID) | ORAL | 0 refills | Status: DC

## 2021-05-28 MED ORDER — TRAZODONE HCL 100 MG PO TABS: 100.0000 mg | ORAL_TABLET | Freq: Every day | ORAL | 0 refills | Status: DC

## 2021-05-28 NOTE — Progress Notes (Signed)
Virtual Visit via Video Note  I connected with Colleen Tapia on 05/28/21 at 11:30 AM EDT by a video enabled telemedicine application and verified that I am speaking with the correct person using two identifiers.  Location: Patient: Home Provider: Office   I discussed the limitations of evaluation and management by telemedicine and the availability of in person appointments. The patient expressed understanding and agreed to proceed.  I discussed the assessment and treatment plan with the patient. The patient was provided an opportunity to ask questions and all were answered. The patient agreed with the plan and demonstrated an understanding of the instructions.   The patient was advised to call back or seek an in-person evaluation if the symptoms worsen or if the condition fails to improve as anticipated.  I provided 17 minutes of non-face-to-face time during this encounter.   [name], MD     [facility] MD/PA/NP OP Progress Note  05/28/2021 1:54 PM Colleen Tapia  MRN:  [id number]  Chief Complaint:  "very emotional"(pt).  HPI: This is a 25 year old Caucasian female with hx of generalized anxiety disorder, MDD, panic attacks previously on Zoloft and BuSpar since March 2019 and also tried on Wellbutrin XL which caused more irritability and therefore discontinued and currently prescribed Latuda 40 mg once a day and Lamictal 25 mg BID for concerns regarding bipolar disorder and BuSpar for anxiety.   Patient presents today via virtual appt for 2 week follow up. She reports feeling better since her last visit, anxiety is "less", she reports anxiety is 6 or 7 out of 10 (10=worst anxiety) today and states its improved from 2 weeks ago when it was 10 out of 10. Anxiety is more manageable. She continues to have sleep concerns, "try to get 7 hours of sleep", she reports that it takes her one hours to initiate sleep and frequent wakings through the night. Once awake it takes her 45 mins to get  back to sleep. Her trazodone was increased last visit on 10/18, despite the increased dose not helping her sleep. She reports her mood is"better", still feeling"emotional" rates her mood  4 out of 10 (10=best mood), some days she feels "low" but better than before, irritability "not really there". She continues to have lack of motivation with school work, "not fun", trying to keep up with assignments. Her latuda dose was increased 2 weeks ago and she feels it's making her feel more tired in the morning. She is spending her time applying for a job and schoolwork. She denies any suicidal or homicidal ideation. Denies any self harm behaviors    Visit Diagnosis:    ICD-10-CM   1. Generalized anxiety disorder with panic attacks  F41.1 busPIRone (BUSPAR) 10 MG tablet   F41.0     2. Bipolar disorder, current episode mixed, moderate (HCC)  F31.62 lamoTRIgine (LAMICTAL) 25 MG tablet       Past Psychiatric History: reviewed today, no change.   Past Medical History:  Past Medical History:  Diagnosis Date   Anxiety     Past Surgical History:  Procedure Laterality Date   MOUTH SURGERY      Family Psychiatric History: As mentioned in initial H&P, reviewed today, no change  Family History:  Family History  Problem Relation Age of Onset   Anxiety disorder Mother    Depression Mother     Social History:  Social History   Socioeconomic History   Marital status: Single    Spouse name: Not on file  Number of children: 0   Years of education: Not on file   Highest education level: Some college, no degree  Occupational History   Not on file  Tobacco Use   Smoking status: Never   Smokeless tobacco: Never  Vaping Use   Vaping Use: Never used  Substance and Sexual Activity   Alcohol use: Yes    Alcohol/week: 3.0 - 5.0 standard drinks    Types: 3 - 4 Shots of liquor per week   Drug use: Not Currently   Sexual activity: Not Currently  Other Topics Concern   Not on file  Social History  Narrative   Not on file   Social Determinants of Health   Financial Resource Strain: Not on file  Food Insecurity: Not on file  Transportation Needs: Not on file  Physical Activity: Not on file  Stress: Not on file  Social Connections: Not on file    Allergies:  Allergies  Allergen Reactions   No Known Allergies     Metabolic Disorder Labs: No results found for: HGBA1C, MPG Lab Results  Component Value Date   PROLACTIN 13.5 02/01/2020   No results found for: CHOL, TRIG, HDL, CHOLHDL, VLDL, LDLCALC Lab Results  Component Value Date   TSH 3.700 04/23/2021   TSH 0.550 01/24/2021    Therapeutic Level Labs: No results found for: LITHIUM No results found for: VALPROATE No components found for:  CBMZ  Current Medications: Current Outpatient Medications  Medication Sig Dispense Refill   busPIRone (BUSPAR) 10 MG tablet Take 2 tablets (20 mg total) by mouth 2 (two) times daily. 120 tablet 0   calcitRIOL (ROCALTROL) 0.25 MCG capsule TAKE 1 CAPSULE BY MOUTH EVERY MORNING AND 1 CAPSULE BY MOUTH EACH NIGHT AT BEDTIME 60 capsule 3   Calcium Carbonate-Vitamin D 600-400 MG-UNIT tablet Take by mouth.     fluticasone (FLONASE) 50 MCG/ACT nasal spray Place into the nose.     Lactobacillus (AZO COMPLETE FEMININE BALANCE PO) AZO Complete Feminine Balance     lamoTRIgine (LAMICTAL) 25 MG tablet Take 1 tablet (25 mg total) by mouth 2 (two) times daily. 60 tablet 0   levocetirizine (XYZAL) 5 MG tablet Take 5 mg by mouth every evening.     levothyroxine (SYNTHROID) 50 MCG tablet Take 1 tablet (50 mcg total) by mouth daily. 90 tablet 2   lurasidone (LATUDA) 40 MG TABS tablet Take 1 tablet (40 mg total) by mouth daily with supper. 30 tablet 0   meloxicam (MOBIC) 15 MG tablet Take 1 tablet (15 mg total) by mouth daily. 30 tablet 1   meloxicam (MOBIC) 15 MG tablet Take 1 tablet by mouth daily.     norgestimate-ethinyl estradiol (ORTHO-CYCLEN) 0.25-35 MG-MCG tablet Take 1 tablet by mouth daily.      traZODone (DESYREL) 100 MG tablet Take 1 tablet (100 mg total) by mouth at bedtime. 30 tablet 0   Current Facility-Administered Medications  Medication Dose Route Frequency Provider Last Rate Last Admin   betamethasone acetate-betamethasone sodium phosphate (CELESTONE) injection 3 mg  3 mg Intra-articular Once Felecia Shelling, DPM         Musculoskeletal: Strength & Muscle Tone: unable to assess since visit was over the telemedicine. Gait & Station: unable to assess since visit was over the telemedicine. Patient leans: N/A  Psychiatric Specialty Exam: ROSReview of 12 systems negative except as mentioned in HPI   There were no vitals taken for this visit.There is no height or weight on file to calculate BMI.  General Appearance: Casual and Fairly Groomed  Eye Contact:  Good  Speech:  Clear and Coherent and Normal Rate  Volume:  Normal  Mood:  "better"  Affect:  Appropriate, Congruent, and Restricted  Thought Process:  Goal Directed and Linear  Orientation:  Full (Time, Place, and Person)  Thought Content: Logical   Suicidal Thoughts:  No  Homicidal Thoughts:  No  Memory:  Immediate;   Fair Recent;   Fair Remote;   Fair  Judgement:  Fair  Insight:  Fair  Psychomotor Activity:  Normal  Concentration:  Concentration: Good and Attention Span: Good  Recall:  Good  Fund of Knowledge: Good  Language: Good  Akathisia:  No    AIMS (if indicated): not done  Assets:  Communication Skills Desire for Improvement Financial Resources/Insurance Housing Leisure Time Physical Health Social Support Talents/Skills Transportation Vocational/Educational  ADL's:  Intact  Cognition: WNL  Sleep:  Poor   Screenings:   Assessment and Plan:   25 yo F with hx of anxiety disorder on Zoloft since March of 2019 and was previously in counseling at [facility] [employer].  She is diagnosed with generalized anxiety disorder with panic attacks and has responded well to  Zoloft 100 mg once a day and BuSpar 10 mg 2 times a day. However recently has noticed more mood fluctuation, irritability, agitation, depressed mood, sleep changes which initially was thought to be consistent with MDD. However Zoloft and Wellbutrin both worsened her symptoms, and she continued to experience mood lability, irritability, agitation, depressed mood, sleep changes, recently noted self applying to 100 jobs and there is a strong +ve family of bipolar disorder and therefore considering the dx of Bipolar Disorder.  She was started on Latuda as pt's mother and brother has responded well to it.   At her last appointment about 2 weeks ago, she reported worsening of mood lability, anxiety and sleep in the context of current psychosocial stressors(not getting a job yet, overthinking about her future and relationships). She reported that she was more tearful, sad, has lack of motivation attending school, problems with sleep and energy, which appeared to be most likely in the context of her ongoing psychosocial stressors. She was recommended to increase Latuda at the last appointment and appears to have tolerated it well with partial improvement in mood and anxiety. Latuda was increased 2 weeks ago and recommended to wait to assess the response. Recommended to increase Buspar to 20 mg BID for anxiety.       Plan as below:     Plan: Problem 1: Mood(chronic and stable) - Continue with Lamictal 25 mg BID - Continue Latuda 40 mg daily and switch to dinner due to some tiredness. with breakfast - Risks and benefits discussed.  - She is recommended individual psychotherapy and recently started seeing therapist through [facility] however therapist is booked out and not able to see other therapist.   Problem 2: Generalized Anxiety with Panic attack(chronic and unstable) Plan:           - Increase Buspar to 20 mg BID          - Follow up in 4 weeks or early if needed. .   Problem 3: Sleep Plan:  - Increase  Trazodone to 100  mg QHS for sleep.         MDM = 2 or more chronic stable/unstable condition + med management        [name], MD 05/28/2021, 1:54 PM

## 2021-05-29 ENCOUNTER — Telehealth: Payer: BC Managed Care – PPO | Admitting: Child and Adolescent Psychiatry

## 2021-05-29 ENCOUNTER — Ambulatory Visit: Payer: BC Managed Care – PPO | Admitting: Endocrinology

## 2021-06-25 ENCOUNTER — Other Ambulatory Visit: Payer: Self-pay

## 2021-06-25 ENCOUNTER — Telehealth (INDEPENDENT_AMBULATORY_CARE_PROVIDER_SITE_OTHER): Payer: BC Managed Care – PPO | Admitting: Child and Adolescent Psychiatry

## 2021-06-25 DIAGNOSIS — F410 Panic disorder [episodic paroxysmal anxiety] without agoraphobia: Secondary | ICD-10-CM | POA: Diagnosis not present

## 2021-06-25 DIAGNOSIS — F3131 Bipolar disorder, current episode depressed, mild: Secondary | ICD-10-CM

## 2021-06-25 DIAGNOSIS — F411 Generalized anxiety disorder: Secondary | ICD-10-CM | POA: Diagnosis not present

## 2021-06-25 MED ORDER — LAMOTRIGINE 25 MG PO TABS: 25.0000 mg | ORAL_TABLET | Freq: Two times a day (BID) | ORAL | 0 refills | Status: DC

## 2021-06-25 MED ORDER — TRAZODONE HCL 100 MG PO TABS: 100.0000 mg | ORAL_TABLET | Freq: Every day | ORAL | 0 refills | Status: DC

## 2021-06-25 MED ORDER — LURASIDONE HCL 40 MG PO TABS: 40.0000 mg | ORAL_TABLET | Freq: Every day | ORAL | 0 refills | Status: DC

## 2021-06-25 MED ORDER — BUSPIRONE HCL 10 MG PO TABS: 20.0000 mg | ORAL_TABLET | Freq: Two times a day (BID) | ORAL | 0 refills | Status: DC
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
## 2021-07-22 ENCOUNTER — Other Ambulatory Visit: Payer: Self-pay | Admitting: Child and Adolescent Psychiatry

## 2021-07-22 DIAGNOSIS — F411 Generalized anxiety disorder: Secondary | ICD-10-CM

## 2021-08-05 ENCOUNTER — Other Ambulatory Visit: Payer: Self-pay

## 2021-08-05 ENCOUNTER — Telehealth (INDEPENDENT_AMBULATORY_CARE_PROVIDER_SITE_OTHER): Payer: BC Managed Care – PPO | Admitting: Child and Adolescent Psychiatry

## 2021-08-05 DIAGNOSIS — F3131 Bipolar disorder, current episode depressed, mild: Secondary | ICD-10-CM

## 2021-08-05 MED ORDER — TRAZODONE HCL 100 MG PO TABS: 100.0000 mg | ORAL_TABLET | Freq: Every day | ORAL | 0 refills | Status: DC

## 2021-08-05 MED ORDER — LURASIDONE HCL 40 MG PO TABS: 40.0000 mg | ORAL_TABLET | Freq: Every day | ORAL | 1 refills | Status: DC

## 2021-08-05 MED ORDER — LAMOTRIGINE 25 MG PO TABS: 25.0000 mg | ORAL_TABLET | Freq: Two times a day (BID) | ORAL | 1 refills | Status: DC

## 2021-08-05 NOTE — Progress Notes (Signed)
Virtual Visit via Video Note  I connected with Colleen Tapia on 08/05/21 at  9:30 AM EST by a video enabled telemedicine application and verified that I am speaking with the correct person using two identifiers.  Location: Patient: College Provider: Office   I discussed the limitations of evaluation and management by telemedicine and the availability of in person appointments. The patient expressed understanding and agreed to proceed.  I discussed the assessment and treatment plan with the patient. The patient was provided an opportunity to ask questions and all were answered. The patient agreed with the plan and demonstrated an understanding of the instructions.   The patient was advised to call back or seek an in-person evaluation if the symptoms worsen or if the condition fails to improve as anticipated.  I provided 16 minutes of non-face-to-face time during this encounter.   [name], MD     [facility] MD/PA/NP OP Progress Note  08/05/2021 9:51 AM Colleen Tapia  MRN:  [id number]  Chief Complaint:   "I am doing ok".  HPI: This is a 26 year old Caucasian female with hx of generalized anxiety disorder, MDD, panic attacks previously on Zoloft and BuSpar since March 2019 and also tried on Wellbutrin XL which caused more irritability and therefore discontinued and currently prescribed Latuda 40 mg once a day and Lamictal 25 mg BID for concerns regarding bipolar disorder and BuSpar for anxiety.   Colleen Tapia was seen and evaluated over telemedicine encounter for medication management follow-up.  She reports that she has been doing "okay".  She reports that she was able to finish her school work and was able to graduate from [employer] with a degree in MPH.  She reports that since then she has been home, went to her friend in [place] for near and just returned back home yesterday.  She reports that her mood has been "stable", and not irritable as it was previously.  She reports that she has been  sleeping better with trazodone and switching Latuda to bedtime has helped with morning tiredness.  She believes that her medications are working well for her now.  In regards of anxiety, she reports anxiety in the context of uncertainty about her job.  She reports that she has applied to many places for job but waiting to hear back. Provided refelctive and empathic listening, and validated patient's experience.  She reports that she has reached out to [facility], was assigned a therapist but found out that she knew this therapist personally therefore waiting to hear back from them to get a new therapist assignment.  She denies any SI/HI, eating well, reports decent energy.  We discussed to try to be active and she reports that she is planning to join gym again.  We discussed to continue with current medications and start therapy.  Discussed to follow back again and 6 weeks or earlier if needed.  She verbalized understanding and agreed with the plan.  Visit Diagnosis:    ICD-10-CM   1. Bipolar affective disorder, currently depressed, mild (HCC)  F31.31 lamoTRIgine (LAMICTAL) 25 MG tablet        Past Psychiatric History: reviewed today, no change.   Past Medical History:  Past Medical History:  Diagnosis Date   Anxiety     Past Surgical History:  Procedure Laterality Date   MOUTH SURGERY      Family Psychiatric History: As mentioned in initial H&P, reviewed today, no change  Family History:  Family History  Problem Relation Age of  Onset   Anxiety disorder Mother    Depression Mother     Social History:  Social History   Socioeconomic History   Marital status: Single    Spouse name: Not on file   Number of children: 0   Years of education: Not on file   Highest education level: Some college, no degree  Occupational History   Not on file  Tobacco Use   Smoking status: Never   Smokeless tobacco: Never  Vaping Use   Vaping Use: Never used  Substance and Sexual Activity    Alcohol use: Yes    Alcohol/week: 3.0 - 5.0 standard drinks    Types: 3 - 4 Shots of liquor per week   Drug use: Not Currently   Sexual activity: Not Currently  Other Topics Concern   Not on file  Social History Narrative   Not on file   Social Determinants of Health   Financial Resource Strain: Not on file  Food Insecurity: Not on file  Transportation Needs: Not on file  Physical Activity: Not on file  Stress: Not on file  Social Connections: Not on file    Allergies:  Allergies  Allergen Reactions   No Known Allergies     Metabolic Disorder Labs: No results found for: HGBA1C, MPG Lab Results  Component Value Date   PROLACTIN 13.5 02/01/2020   No results found for: CHOL, TRIG, HDL, CHOLHDL, VLDL, LDLCALC Lab Results  Component Value Date   TSH 3.700 04/23/2021   TSH 0.550 01/24/2021    Therapeutic Level Labs: No results found for: LITHIUM No results found for: VALPROATE No components found for:  CBMZ  Current Medications: Current Outpatient Medications  Medication Sig Dispense Refill   busPIRone (BUSPAR) 10 MG tablet TAKE 2 TABLETS BY MOUTH TWO TIMES A DAY 120 tablet 0   calcitRIOL (ROCALTROL) 0.25 MCG capsule TAKE 1 CAPSULE BY MOUTH EVERY MORNING AND 1 CAPSULE BY MOUTH EACH NIGHT AT BEDTIME 60 capsule 3   Calcium Carbonate-Vitamin D 600-400 MG-UNIT tablet Take by mouth.     fluticasone (FLONASE) 50 MCG/ACT nasal spray Place into the nose.     Lactobacillus (AZO COMPLETE FEMININE BALANCE PO) AZO Complete Feminine Balance     lamoTRIgine (LAMICTAL) 25 MG tablet Take 1 tablet (25 mg total) by mouth 2 (two) times daily. 60 tablet 1   levocetirizine (XYZAL) 5 MG tablet Take 5 mg by mouth every evening.     levothyroxine (SYNTHROID) 50 MCG tablet Take 1 tablet (50 mcg total) by mouth daily. 90 tablet 2   lurasidone (LATUDA) 40 MG TABS tablet Take 1 tablet (40 mg total) by mouth daily with supper. 30 tablet 1   meloxicam (MOBIC) 15 MG tablet Take 1 tablet (15 mg  total) by mouth daily. 30 tablet 1   meloxicam (MOBIC) 15 MG tablet Take 1 tablet by mouth daily.     norgestimate-ethinyl estradiol (ORTHO-CYCLEN) 0.25-35 MG-MCG tablet Take 1 tablet by mouth daily.     traZODone (DESYREL) 100 MG tablet Take 1 tablet (100 mg total) by mouth at bedtime. 30 tablet 0   Current Facility-Administered Medications  Medication Dose Route Frequency Provider Last Rate Last Admin   betamethasone acetate-betamethasone sodium phosphate (CELESTONE) injection 3 mg  3 mg Intra-articular Once Felecia ShellingEvans, Brent M, DPM         Musculoskeletal: Strength & Muscle Tone: unable to assess since visit was over the telemedicine. Gait & Station: unable to assess since visit was over the telemedicine. Patient leans:  N/A  Psychiatric Specialty Exam: ROSReview of 12 systems negative except as mentioned in HPI   There were no vitals taken for this visit.There is no height or weight on file to calculate BMI.  General Appearance: Casual and Fairly Groomed  Eye Contact:  Good  Speech:  Clear and Coherent and Normal Rate  Volume:  Normal  Mood:  "ok"  Affect:  Appropriate, Congruent, and Restricted  Thought Process:  Goal Directed and Linear  Orientation:  Full (Time, Place, and Person)  Thought Content: Logical   Suicidal Thoughts:  No  Homicidal Thoughts:  No  Memory:  Immediate;   Fair Recent;   Fair Remote;   Fair  Judgement:  Fair  [facility]:  Fair  Psychomotor Activity:  Normal  Concentration:  Concentration: Good and Attention Span: Good  Recall:  Good  Fund of Knowledge: Good  Language: Good  Akathisia:  No    AIMS (if indicated): not done  Assets:  Communication Skills Desire for Improvement Financial Resources/Insurance Housing Leisure Time Physical Health Social Support Talents/Skills Transportation Vocational/Educational  ADL's:  Intact  Cognition: WNL  Sleep:   Good   Screenings:   Assessment and Plan:   26 yo F with hx of anxiety disorder on  Zoloft since March of 2019 and was previously in counseling at [facility] [employer].  She is diagnosed with generalized anxiety disorder with panic attacks and has responded well to Zoloft 100 mg once a day and BuSpar 10 mg 2 times a day. However recently has noticed more mood fluctuation, irritability, agitation, depressed mood, sleep changes which initially was thought to be consistent with MDD. However Zoloft and Wellbutrin both worsened her symptoms, and she continued to experience mood lability, irritability, agitation, depressed mood, sleep changes, recently noted self applying to 100 jobs and there is a strong +ve family of bipolar disorder and therefore considering the dx of Bipolar Disorder.  She was started on Latuda as pt's mother and brother has responded well to it.   At her appointment in 04/2021, she reported worsening of mood lability, anxiety and sleep in the context of  psychosocial stressors(not getting a job yet, overthinking about her future and relationships). She reported that she was more tearful, sad, has lack of motivation attending school, problems with sleep and energy, which appeared to be most likely in the context of her ongoing psychosocial stressors. She was recommended to increase Latuda then which seems to have helped with mood and increased dose of buspar seems to have helped with anxiety.    Her mood and anxiety appears stable and she was able to graduate which seems to have reduced some stress. She is planning to start ind therapy at [facility].   Plan as below:   Plan: Problem 1: Mood(chronic and stable) - Continue with Lamictal 25 mg BID - Continue Latuda 40 mg daily and switch to dinner due to some tiredness.  - Risks and benefits discussed.  - She is recommended individual psychotherapy and recently started seeing therapist through [facility] however therapist is booked out and not able to see other therapist.   Problem 2: Generalized Anxiety with  Panic attack(chronic andstable) Plan:           - Continue with Buspar 20 mg BID          - Follow up in 4 weeks or early if needed. .   Problem 3: Sleep Plan:  - Continue with Trazodone 100  mg QHS for sleep.  MDM = 2 or more chronic stable conditions + med management        Darcel SmallingHiren M Anabela Crayton, MD 08/05/2021, 9:51 AM

## 2021-08-13 ENCOUNTER — Other Ambulatory Visit: Payer: Self-pay | Admitting: Endocrinology

## 2021-08-14 ENCOUNTER — Emergency Department: Payer: BC Managed Care – PPO

## 2021-08-14 ENCOUNTER — Encounter: Payer: Self-pay | Admitting: Emergency Medicine

## 2021-08-14 ENCOUNTER — Emergency Department
Admission: EM | Admit: 2021-08-14 | Discharge: 2021-08-14 | Disposition: A | Discharge status: Home / Self Care | Payer: BC Managed Care – PPO | Attending: Student in an Organized Health Care Education/Training Program | Admitting: Student in an Organized Health Care Education/Training Program

## 2021-08-14 ENCOUNTER — Other Ambulatory Visit: Payer: Self-pay

## 2021-08-14 DIAGNOSIS — R1084 Generalized abdominal pain: Secondary | ICD-10-CM | POA: Diagnosis not present

## 2021-08-14 DIAGNOSIS — R112 Nausea with vomiting, unspecified: Secondary | ICD-10-CM | POA: Insufficient documentation

## 2021-08-14 DIAGNOSIS — Z20822 Contact with and (suspected) exposure to covid-19: Secondary | ICD-10-CM | POA: Diagnosis not present

## 2021-08-14 LAB — CBC
HCT: 44.1 % (ref 36.0–46.0)
Hemoglobin: 14.7 g/dL (ref 12.0–15.0)
MCH: 31 pg (ref 26.0–34.0)
MCHC: 33.3 g/dL (ref 30.0–36.0)
MCV: 93 fL (ref 80.0–100.0)
Platelets: 283 10*3/uL (ref 150–400)
RBC: 4.74 MIL/uL (ref 3.87–5.11)
RDW: 13.4 % (ref 11.5–15.5)
WBC: 9.4 10*3/uL (ref 4.0–10.5)
nRBC: 0 % (ref 0.0–0.2)

## 2021-08-14 LAB — RESP PANEL BY RT-PCR (FLU A&B, COVID) ARPGX2
Influenza A by PCR: NEGATIVE
Influenza B by PCR: NEGATIVE
SARS Coronavirus 2 by RT PCR: NEGATIVE

## 2021-08-14 LAB — COMPREHENSIVE METABOLIC PANEL
ALT: 11 U/L (ref 0–44)
AST: 14 U/L — ABNORMAL LOW (ref 15–41)
Albumin: 3.8 g/dL (ref 3.5–5.0)
Alkaline Phosphatase: 79 U/L (ref 38–126)
Anion gap: 7 (ref 5–15)
BUN: 8 mg/dL (ref 6–20)
CO2: 21 mmol/L — ABNORMAL LOW (ref 22–32)
Calcium: 8.2 mg/dL — ABNORMAL LOW (ref 8.9–10.3)
Chloride: 104 mmol/L (ref 98–111)
Creatinine, Ser: 0.67 mg/dL (ref 0.44–1.00)
GFR, Estimated: >60 mL/min (ref >60)
Glucose, Bld: 102 mg/dL — ABNORMAL HIGH (ref 70–99)
Potassium: 3.8 mmol/L (ref 3.5–5.1)
Sodium: 132 mmol/L — ABNORMAL LOW (ref 135–145)
Total Bilirubin: 0.5 mg/dL (ref 0.3–1.2)
Total Protein: 7.7 g/dL (ref 6.5–8.1)

## 2021-08-14 LAB — URINALYSIS, ROUTINE W REFLEX MICROSCOPIC
Bacteria, UA: NONE SEEN
Bilirubin Urine: NEGATIVE
Glucose, UA: NEGATIVE mg/dL
Ketones, ur: 5 mg/dL — AB
Leukocytes,Ua: NEGATIVE
Nitrite: NEGATIVE
Protein, ur: NEGATIVE mg/dL
Specific Gravity, Urine: 1.024 (ref 1.005–1.030)
pH: 5 (ref 5.0–8.0)

## 2021-08-14 LAB — LIPASE, BLOOD: Lipase: 25 U/L (ref 11–51)

## 2021-08-14 LAB — POC URINE PREG, ED: Preg Test, Ur: NEGATIVE

## 2021-08-14 MED ORDER — PROMETHAZINE HCL 25 MG PO TABS: 25.0000 mg | ORAL_TABLET | Freq: Four times a day (QID) | ORAL | 0 refills | Status: DC | PRN

## 2021-08-14 MED ORDER — IOHEXOL 300 MG/ML  SOLN
100.0000 mL | Freq: Once | INTRAMUSCULAR | Status: AC | PRN
  Administered 2021-08-14: 100 mL via INTRAVENOUS
  Filled 2021-08-14: qty 100

## 2021-08-14 MED ORDER — IBUPROFEN 400 MG PO TABS
400.0000 mg | ORAL_TABLET | Freq: Once | ORAL | Status: AC | PRN
  Administered 2021-08-14: 400 mg via ORAL
  Filled 2021-08-14: qty 1

## 2021-08-14 MED ORDER — SODIUM CHLORIDE 0.9 % IV BOLUS
1000.0000 mL | Freq: Once | INTRAVENOUS | Status: AC
  Administered 2021-08-14: 1000 mL via INTRAVENOUS

## 2021-08-14 NOTE — ED Provider Notes (Signed)
[facility] Provider Note    Event Date/Time   First MD Initiated Contact with Patient 08/14/21 2023     (approximate)   History   Abdominal Pain and Emesis   HPI  Colleen Tapia is a 26 y.o. female with a history of generalized anxiety disorder but no history of abdominal issues presents the ER for evaluation 24 hours of abdominal pain she describes as generalized crampy in nature associate with nausea vomiting.  No diarrhea.  Is primarily in the upper area but also having some right-sided abdominal pain.  Has never had discomfort like this before.  No previous abdominal surgeries.  She went over to urgent care and was directed to the ER for further evaluation.   Physical Exam   Triage Vital Signs: ED Triage Vitals  Enc Vitals Group     BP 08/14/21 1700 (!) 142/78     Pulse Rate 08/14/21 1700 (!) 130     Resp 08/14/21 1700 16     Temp 08/14/21 1700 99.5 F (37.5 C)     Temp Source 08/14/21 1700 Oral     SpO2 08/14/21 1700 96 %     Weight 08/14/21 1645 212 lb 15.4 oz (96.6 kg)     Height 08/14/21 1645 5\' 5"  (1.651 m)     Head Circumference --      Peak Flow --      Pain Score 08/14/21 1645 8     Pain Loc --      Pain Edu? --      Excl. in [place]? --     Most recent vital signs: Vitals:   08/14/21 1700 08/14/21 2103  BP: (!) 142/78 131/79  Pulse: (!) 130 99  Resp: 16 20  Temp: 99.5 F (37.5 C)   SpO2: 96% 99%     Constitutional: Alert  Eyes: Conjunctivae are normal.  Head: Atraumatic. Nose: No congestion/rhinnorhea. Mouth/Throat: Mucous membranes are moist.   Neck: Painless ROM.  Cardiovascular:   Good peripheral circulation. Respiratory: Normal respiratory effort.  No retractions.  Gastrointestinal: Soft and nontender.  Musculoskeletal:  no deformity Neurologic:  MAE spontaneously. No gross focal neurologic deficits are appreciated.  Skin:  Skin is warm, dry and intact. No rash noted. Psychiatric: Mood and affect are normal.  Speech and behavior are normal.    ED Results / Procedures / Treatments   Labs (all labs ordered are listed, but only abnormal results are displayed) Labs Reviewed  COMPREHENSIVE METABOLIC PANEL - Abnormal; Notable for the following components:      Result Value   Sodium 132 (*)    CO2 21 (*)    Glucose, Bld 102 (*)    Calcium 8.2 (*)    AST 14 (*)    All other components within normal limits  URINALYSIS, ROUTINE W REFLEX MICROSCOPIC - Abnormal; Notable for the following components:   Color, Urine YELLOW (*)    APPearance HAZY (*)    Hgb urine dipstick MODERATE (*)    Ketones, ur 5 (*)    All other components within normal limits  RESP PANEL BY RT-PCR (FLU A&B, COVID) ARPGX2  LIPASE, BLOOD  CBC  POC URINE PREG, ED     EKG     RADIOLOGY Please see ED Course for my review and interpretation.  I personally reviewed all radiographic images ordered to evaluate for the above acute complaints and reviewed radiology reports and findings.  These findings were personally discussed with the patient.  Please  see medical record for radiology report.    PROCEDURES:  Critical Care performed: No  Procedures   MEDICATIONS ORDERED IN ED: Medications  ibuprofen (ADVIL) tablet 400 mg (400 mg Oral Given 08/14/21 1846)  sodium chloride 0.9 % bolus 1,000 mL (1,000 mLs Intravenous New Bag/Given 08/14/21 2035)  iohexol (OMNIPAQUE) 300 MG/ML solution 100 mL (100 mLs Intravenous Contrast Given 08/14/21 2129)     IMPRESSION / MDM / ASSESSMENT AND PLAN / ED COURSE  I reviewed the triage vital signs and the nursing notes.                              Differential diagnosis includes, but is not limited to, appendicitis, cholelithiasis, cholecystitis, enteritis, IBS, IBD, viral illness, colitis  Presents to the ER for evaluation of generalized abdominal pain associated nausea vomiting she is mildly tachycardic with a low-grade temperatures but not toxic appearing.  No white count does  appear slightly dehydrated therefore IV fluids ordered.  She was given Motrin.  She denies any dysuria or flank pain.  Given location of pain CT imaging ordered for the above differential.  Clinical Course as of 08/14/21 2212  Wed Aug 14, 2021  2202 Patient reassed.  Repeat abd soft and bening.  Ct imaging by my review doesn't show obstructive pattern.  Per radiology report no acute abnormality.  Patient feels well and requesting DC home.  Have discussed with the patient and available family all diagnostics and treatments performed thus far and all questions were answered to the best of my ability. The patient demonstrates understanding and agreement with plan.  [PR]    Clinical Course User Index [PR] Merlyn Lot, MD     FINAL CLINICAL IMPRESSION(S) / ED DIAGNOSES   Final diagnoses:  Nausea and vomiting, unspecified vomiting type  Generalized abdominal pain     Rx / DC Orders   ED Discharge Orders          Ordered    promethazine (PHENERGAN) 25 MG tablet  Every 6 hours PRN        08/14/21 2201             Note:  This document was prepared using Dragon voice recognition software and may include unintentional dictation errors.    Merlyn Lot, MD 08/14/21 2212

## 2021-08-14 NOTE — Discharge Instructions (Signed)

## 2021-08-14 NOTE — ED Triage Notes (Signed)
Pt comes into the ED via POV from [place] c/o generalized abd pain and emesis that has been ongoing since yesterday.  Pt in NAD at this time with even and unlabored respirations.

## 2021-08-19 ENCOUNTER — Other Ambulatory Visit: Payer: Self-pay | Admitting: Child and Adolescent Psychiatry

## 2021-08-19 DIAGNOSIS — F411 Generalized anxiety disorder: Secondary | ICD-10-CM

## 2021-08-19 DIAGNOSIS — F410 Panic disorder [episodic paroxysmal anxiety] without agoraphobia: Secondary | ICD-10-CM

## 2021-09-14 ENCOUNTER — Other Ambulatory Visit: Payer: Self-pay | Admitting: Child and Adolescent Psychiatry

## 2021-09-22 ENCOUNTER — Other Ambulatory Visit: Payer: Self-pay | Admitting: Child and Adolescent Psychiatry

## 2021-09-22 DIAGNOSIS — F410 Panic disorder [episodic paroxysmal anxiety] without agoraphobia: Secondary | ICD-10-CM

## 2021-09-22 DIAGNOSIS — F411 Generalized anxiety disorder: Secondary | ICD-10-CM

## 2021-09-23 ENCOUNTER — Telehealth: Payer: Self-pay | Admitting: Endocrinology

## 2021-09-23 NOTE — Telephone Encounter (Signed)
Pt is calling in wanting to have her labs done today for her appointment on 09/26/2021.  Pt would like to have it done at [facility] in [place], [place] [phone] ([phone]).

## 2021-09-24 ENCOUNTER — Other Ambulatory Visit: Payer: Self-pay

## 2021-09-24 ENCOUNTER — Telehealth (INDEPENDENT_AMBULATORY_CARE_PROVIDER_SITE_OTHER): Payer: BC Managed Care – PPO | Admitting: Child and Adolescent Psychiatry

## 2021-09-24 ENCOUNTER — Other Ambulatory Visit: Payer: Self-pay | Admitting: Endocrinology

## 2021-09-24 DIAGNOSIS — F3131 Bipolar disorder, current episode depressed, mild: Secondary | ICD-10-CM | POA: Diagnosis not present

## 2021-09-24 DIAGNOSIS — F411 Generalized anxiety disorder: Secondary | ICD-10-CM | POA: Diagnosis not present

## 2021-09-24 DIAGNOSIS — F410 Panic disorder [episodic paroxysmal anxiety] without agoraphobia: Secondary | ICD-10-CM

## 2021-09-24 MED ORDER — LURASIDONE HCL 40 MG PO TABS
40.0000 mg | ORAL_TABLET | Freq: Every day | ORAL | 1 refills | Status: DC
Start: 2021-09-24 — End: 2021-11-12

## 2021-09-24 MED ORDER — TRAZODONE HCL 100 MG PO TABS
100.0000 mg | ORAL_TABLET | Freq: Every day | ORAL | 1 refills | Status: DC
Start: 2021-09-24 — End: 2021-11-12

## 2021-09-24 MED ORDER — LAMOTRIGINE 25 MG PO TABS: 25.0000 mg | ORAL_TABLET | Freq: Two times a day (BID) | ORAL | 1 refills | Status: DC

## 2021-09-24 NOTE — Progress Notes (Signed)
Virtual Visit via Video Note  I connected with Colleen Tapia on 09/24/21 at  9:30 AM EST by a video enabled telemedicine application and verified that I am speaking with the correct person using two identifiers.  Location: Patient: College Provider: Office   I discussed the limitations of evaluation and management by telemedicine and the availability of in person appointments. The patient expressed understanding and agreed to proceed.  I discussed the assessment and treatment plan with the patient. The patient was provided an opportunity to ask questions and all were answered. The patient agreed with the plan and demonstrated an understanding of the instructions.   The patient was advised to call back or seek an in-person evaluation if the symptoms worsen or if the condition fails to improve as anticipated.  Darcel SmallingHiren M Johnell Bas, MD     [facility] Note  09/24/2021 9:54 AM Colleen BucklerJulia A Tapia  MRN:  [id number]  Chief Complaint:   "  I am okay... Nothing changed"(pt)  HPI: This is a 26 year old Caucasian female with hx of generalized anxiety disorder, MDD, panic attacks previously on Zoloft and BuSpar since March 2019 and also tried on Wellbutrin XL which caused more irritability and therefore discontinued and currently prescribed Latuda 40 mg once a day and Lamictal 25 mg BID for concerns regarding bipolar disorder and BuSpar for anxiety.   Colleen AmenJulia was seen and evaluated over telemedicine encounter for medication management follow-up.  She was present by herself and was evaluated alone.  She reports that she has been doing "okay".  She reports that nothing has changed since last appointment.  She reports that her mood has been "okay", denies any highs or lows.  She reports that she spends time watching TV and doing laundry, continue to await responses for the job application she has admitted previously.  She reports that she had one phone interview with [employer] [facility] [employer] last week  but does not believe that she is qualified for it because the job required 6 years of experience.  She reports that her anxiety is as usual, rates it at 5 out of 10, 10 being most anxious.  She reports that she believes she is sleeping well.  She reports that she is guessing that her appetite has been good.  She denies any suicidal thoughts or homicidal thoughts.  She reports that she has started seeing her therapist at [facility] and follows about every week.  She reports that therapy has been helpful.  She reports that she has remained compliant with her medications and denies any problems with them.  She reports that things are going okay at home, her maternal grandfather passed away recently so she is talking about that in her therapy.  She also reports brother being a stressor at home and also working with therapist on this.  We discussed to continue with current medications and follow back again and 6 weeks or earlier if needed.  She verbalized understanding and agreed with the plan.   Visit Diagnosis:    ICD-10-CM   1. Generalized anxiety disorder with panic attacks  F41.1    F41.0     2. Bipolar affective disorder, currently depressed, mild (HCC)  F31.31 lamoTRIgine (LAMICTAL) 25 MG tablet        Past Psychiatric History: reviewed today, no change.   Past Medical History:  Past Medical History:  Diagnosis Date   Anxiety     Past Surgical History:  Procedure Laterality Date   MOUTH SURGERY  Family Psychiatric History: As mentioned in initial H&P, reviewed today, no change  Family History:  Family History  Problem Relation Age of Onset   Anxiety disorder Mother    Depression Mother     Social History:  Social History   Socioeconomic History   Marital status: Single    Spouse name: Not on file   Number of children: 0   Years of education: Not on file   Highest education level: Some college, no degree  Occupational History   Not on file  Tobacco Use   Smoking  status: Never   Smokeless tobacco: Never  Vaping Use   Vaping Use: Never used  Substance and Sexual Activity   Alcohol use: Yes    Alcohol/week: 3.0 - 5.0 standard drinks    Types: 3 - 4 Shots of liquor per week   Drug use: Not Currently   Sexual activity: Not Currently  Other Topics Concern   Not on file  Social History Narrative   Not on file   Social Determinants of Health   Financial Resource Strain: Not on file  Food Insecurity: Not on file  Transportation Needs: Not on file  Physical Activity: Not on file  Stress: Not on file  Social Connections: Not on file    Allergies:  Allergies  Allergen Reactions   No Known Allergies     Metabolic Disorder Labs: No results found for: HGBA1C, MPG Lab Results  Component Value Date   PROLACTIN 13.5 02/01/2020   No results found for: CHOL, TRIG, HDL, CHOLHDL, VLDL, LDLCALC Lab Results  Component Value Date   TSH 3.700 04/23/2021   TSH 0.550 01/24/2021    Therapeutic Level Labs: No results found for: LITHIUM No results found for: VALPROATE No components found for:  CBMZ  Current Medications: Current Outpatient Medications  Medication Sig Dispense Refill   busPIRone (BUSPAR) 10 MG tablet TAKE TWO TABLETS BY MOUTH TWICE A DAY 120 tablet 0   calcitRIOL (ROCALTROL) 0.25 MCG capsule TAKE 1 CAPSULE BY MOUTH EVERY MORNING AND 1 CAPSULE EVERY NIGHT AT BEDTIME 60 capsule 3   Calcium Carbonate-Vitamin D 600-400 MG-UNIT tablet Take by mouth.     fluticasone (FLONASE) 50 MCG/ACT nasal spray Place into the nose.     Lactobacillus (AZO COMPLETE FEMININE BALANCE PO) AZO Complete Feminine Balance     lamoTRIgine (LAMICTAL) 25 MG tablet Take 1 tablet (25 mg total) by mouth 2 (two) times daily. 60 tablet 1   levocetirizine (XYZAL) 5 MG tablet Take 5 mg by mouth every evening.     levothyroxine (SYNTHROID) 50 MCG tablet Take 1 tablet (50 mcg total) by mouth daily. 90 tablet 2   lurasidone (LATUDA) 40 MG TABS tablet Take 1 tablet (40 mg  total) by mouth daily with supper. 30 tablet 1   meloxicam (MOBIC) 15 MG tablet Take 1 tablet (15 mg total) by mouth daily. 30 tablet 1   meloxicam (MOBIC) 15 MG tablet Take 1 tablet by mouth daily.     norgestimate-ethinyl estradiol (ORTHO-CYCLEN) 0.25-35 MG-MCG tablet Take 1 tablet by mouth daily.     promethazine (PHENERGAN) 25 MG tablet Take 1 tablet (25 mg total) by mouth every 6 (six) hours as needed for nausea or vomiting. 8 tablet 0   traZODone (DESYREL) 100 MG tablet Take 1 tablet (100 mg total) by mouth at bedtime. 30 tablet 1   Current Facility-Administered Medications  Medication Dose Route Frequency Provider Last Rate Last Admin   betamethasone acetate-betamethasone sodium phosphate (CELESTONE)  injection 3 mg  3 mg Intra-articular Once Felecia Shelling, DPM         Musculoskeletal: Strength & Muscle Tone: unable to assess since visit was over the telemedicine. Gait & Station: unable to assess since visit was over the telemedicine. Patient leans: N/A  Psychiatric Specialty Exam: ROSReview of 12 systems negative except as mentioned in HPI   There were no vitals taken for this visit.There is no height or weight on file to calculate BMI.  General Appearance: Casual and Fairly Groomed  Eye Contact:  Good  Speech:  Clear and Coherent and Normal Rate  Volume:  Normal  Mood:  "ok"  Affect:  Appropriate, Congruent, and Restricted  Thought Process:  Goal Directed and Linear  Orientation:  Full (Time, Place, and Person)  Thought Content: Logical   Suicidal Thoughts:  No  Homicidal Thoughts:  No  Memory:  Immediate;   Fair Recent;   Fair Remote;   Fair  Judgement:  Fair  [facility]:  Fair  Psychomotor Activity:  Normal  Concentration:  Concentration: Good and Attention Span: Good  Recall:  Good  Fund of Knowledge: Good  Language: Good  Akathisia:  No    AIMS (if indicated): not done  Assets:  Communication Skills Desire for Improvement Financial  Resources/Insurance Housing Leisure Time Physical Health Social Support Talents/Skills Transportation Vocational/Educational  ADL's:  Intact  Cognition: WNL  Sleep:   Good   Screenings: Flowsheet Row ED from 08/14/2021 in [facility]  C-SSRS RISK CATEGORY No Risk        Assessment and Plan:   26 yo F with hx of anxiety disorder on Zoloft since March of 2019 and was previously in counseling at [facility].  She is diagnosed with generalized anxiety disorder with panic attacks and has responded well to Zoloft 100 mg once a day and BuSpar 10 mg 2 times a day. However recently has noticed more mood fluctuation, irritability, agitation, depressed mood, sleep changes which initially was thought to be consistent with MDD. However Zoloft and Wellbutrin both worsened her symptoms, and she continued to experience mood lability, irritability, agitation, depressed mood, sleep changes, recently noted self applying to 100 jobs and there is a strong +ve family of bipolar disorder and therefore considering the dx of Bipolar Disorder.  She was started on Latuda as pt's mother and brother has responded well to it.   At her appointment in 04/2021, she reported worsening of mood lability, anxiety and sleep in the context of  psychosocial stressors(not getting a job yet, overthinking about her future and relationships). She reported that she was more tearful, sad, has lack of motivation attending school, problems with sleep and energy, which appeared to be most likely in the context of her ongoing psychosocial stressors. She was recommended to increase Latuda at that time and since then she seems stability in mood and anxiety. Also graduated from her college which has reduced stress. She has started seeing ind therapist at [facility].   Plan as below:   Plan: Problem 1: Mood(chronic and stable) - Continue with Lamictal 25 mg BID - Continue  Latuda 40 mg daily with dinner  - Risks and benefits discussed.  - Ind therapy with Shara Blazing at [facility].  Problem 2: Generalized Anxiety with Panic attack(chronic andstable) Plan:           - Continue with Buspar 20 mg BID          -  Follow up in 4 weeks or early if needed. .   Problem 3: Sleep Plan:  - Continue with Trazodone 100  mg QHS for sleep.         MDM = 2 or more chronic stable conditions + med management        [name], MD 09/24/2021, 9:54 AM

## 2021-09-24 NOTE — Telephone Encounter (Signed)
Labs from 04/2021 printed and faxed to [phone]

## 2021-09-25 ENCOUNTER — Other Ambulatory Visit: Payer: Self-pay

## 2021-09-25 DIAGNOSIS — E201 Pseudohypoparathyroidism: Secondary | ICD-10-CM

## 2021-09-25 DIAGNOSIS — E038 Other specified hypothyroidism: Secondary | ICD-10-CM

## 2021-09-25 LAB — BASIC METABOLIC PANEL
BUN/Creatinine Ratio: 9 (ref 9–23)
BUN: 7 mg/dL (ref 6–20)
CO2: 21 mmol/L (ref 20–29)
Calcium: 9.2 mg/dL (ref 8.7–10.2)
Chloride: 102 mmol/L (ref 96–106)
Creatinine, Ser: 0.76 mg/dL (ref 0.57–1.00)
Glucose: 77 mg/dL (ref 70–99)
Potassium: 4.5 mmol/L (ref 3.5–5.2)
Sodium: 140 mmol/L (ref 134–144)
eGFR: 113 mL/min/{1.73_m2} (ref >59)

## 2021-09-25 LAB — T4, FREE: Free T4: 1.09 ng/dL (ref 0.82–1.77)

## 2021-09-25 NOTE — Addendum Note (Signed)
Addended by: Adline Mango I on: 09/25/2021 01:32 PM   Modules accepted: Orders

## 2021-09-26 ENCOUNTER — Other Ambulatory Visit: Payer: Self-pay

## 2021-09-26 ENCOUNTER — Ambulatory Visit: Payer: BC Managed Care – PPO | Admitting: Endocrinology

## 2021-09-26 ENCOUNTER — Encounter: Payer: Self-pay | Admitting: Endocrinology

## 2021-09-26 VITALS — BP 124/84 | HR 108 | Ht 65.0 in | Wt 228.6 lb

## 2021-09-26 DIAGNOSIS — E038 Other specified hypothyroidism: Secondary | ICD-10-CM

## 2021-09-26 DIAGNOSIS — E201 Pseudohypoparathyroidism: Secondary | ICD-10-CM

## 2021-09-26 NOTE — Progress Notes (Signed)
Patient ID: Colleen Tapia, female   DOB: 02-Jun-1996, 26 y.o.   MRN: [id number] ? ?       ? ? ? ? ?Chief complaint: Endocrinology follow-up ? ?History of Present Illness: ? ?Pseudohypoparathyroidism ? ? ?Background history: ?She went for routine visit with her PCP and was found to have a calcium was 6.9 in June 2021 ?She thinks that her calcium probably had been low previously also ?Ionized calcium was also low at 3.7, normal >4.5 ?PTH level 384 ? ?RECENT HISTORY: ? ?She has had symptomatic calcium at the time of diagnosis and has been treated with calcitriol and calcium ? ?Her pretreatment symptoms were tightness and cramping in her fingers, at times also a tightness in her face making it difficult to talk ?She was also complaining of feeling tired and having headaches  ? ?Calcium levels have been consistently normal with taking CALCITRIOL 0.25 mcg twice daily ?She takes her prescriptions with the help of pillbox every day regularly ?Has been continued on calcium supplements, using Os-Cal but takes it only in the evening as she forgets in the morning ? ?No recent symptoms of muscle tightening in hands or face and no cramping in muscles ?Calcium is consistently normal around 9 except when she was in the emergency room for gastroenteritis ? ?Lab Results  ?Component Value Date  ? CALCIUM 9.2 09/24/2021  ? CALCIUM 8.2 (L) 08/14/2021  ? CALCIUM 9.0 04/23/2021  ? CALCIUM 8.9 01/24/2021  ? CALCIUM 8.7 10/02/2020  ? CALCIUM 8.5 07/13/2020  ? ?Lab Results  ?Component Value Date  ? CALCIUM 9.2 09/24/2021  ? PHOS 4.2 07/13/2020  ? ? ?Other problems addressed today: See review of systems ? ? ?Past Medical History:  ?Diagnosis Date  ? Anxiety   ? ? ?Past Surgical History:  ?Procedure Laterality Date  ? MOUTH SURGERY    ? ? ?Family History  ?Problem Relation Age of Onset  ? Anxiety disorder Mother   ? Depression Mother   ? ? ?Social History:  reports that she has never smoked. She has never used smokeless tobacco. She reports  current alcohol use of about 3.0 - 5.0 standard drinks per week. She reports that she does not currently use drugs. ? ?Allergies:  ?Allergies  ?Allergen Reactions  ? No Known Allergies   ? ? ?Allergies as of 09/26/2021   ? ?   Reactions  ? No Known Allergies   ? ?  ? ?  ?Medication List  ?  ? ?  ? Accurate as of September 26, 2021  4:15 PM. If you have any questions, ask your nurse or doctor.  ?  ?  ? ?  ? ?AZO COMPLETE FEMININE BALANCE PO ?AZO Complete Feminine Balance ?  ?busPIRone 10 MG tablet ?Commonly known as: BUSPAR ?TAKE TWO TABLETS BY MOUTH TWICE A DAY ?  ?calcitRIOL 0.25 MCG capsule ?Commonly known as: ROCALTROL ?TAKE 1 CAPSULE BY MOUTH EVERY MORNING AND 1 CAPSULE EVERY NIGHT AT BEDTIME ?  ?Calcium Carbonate-Vitamin D 600-400 MG-UNIT tablet ?Take by mouth. ?  ?fluticasone 50 MCG/ACT nasal spray ?Commonly known as: FLONASE ?Place into the nose. ?  ?lamoTRIgine 25 MG tablet ?Commonly known as: LAMICTAL ?Take 1 tablet (25 mg total) by mouth 2 (two) times daily. ?  ?levocetirizine 5 MG tablet ?Commonly known as: XYZAL ?Take 5 mg by mouth every evening. ?  ?levothyroxine 50 MCG tablet ?Commonly known as: SYNTHROID ?Take 1 tablet (50 mcg total) by mouth daily. ?  ?lurasidone 40 MG Tabs tablet ?  Commonly known as: LATUDA ?Take 1 tablet (40 mg total) by mouth daily with supper. ?  ?meloxicam 15 MG tablet ?Commonly known as: MOBIC ?Take 1 tablet (15 mg total) by mouth daily. ?  ?meloxicam 15 MG tablet ?Commonly known as: MOBIC ?Take 1 tablet by mouth daily. ?  ?norgestimate-ethinyl estradiol 0.25-35 MG-MCG tablet ?Commonly known as: ORTHO-CYCLEN ?Take 1 tablet by mouth daily. ?  ?promethazine 25 MG tablet ?Commonly known as: PHENERGAN ?Take 1 tablet (25 mg total) by mouth every 6 (six) hours as needed for nausea or vomiting. ?  ?traZODone 100 MG tablet ?Commonly known as: DESYREL ?Take 1 tablet (100 mg total) by mouth at bedtime. ?  ? ?  ? ? ?  ? ?Review of Systems ? ?HYPOTHYROIDISM:  ? ?She had been complaining of fatigue  and weight gain but no cold intolerance at her initial visit ? ?Had a slightly low baseline free T4 level of 0.59 ?With levothyroxine supplements her fatigue improved ? ?Her levothyroxine dose was decreased to 50 mcg in 7/22 when she had a high normal free T4 ?Since her dosage reduction she has not had as much heat intolerance ? ?Overall she feels fairly good with no unusual fatigue, hair loss or cold intolerance ? ?She takes her levothyroxine before breakfast daily ? ?Thyroid levels as follows ? ?Lab Results  ?Component Value Date  ? TSH 3.700 04/23/2021  ? TSH 0.550 01/24/2021  ? TSH 0.83 10/02/2020  ? FREET4 1.09 09/24/2021  ? FREET4 1.06 04/23/2021  ? FREET4 1.43 01/24/2021  ? ? ? ?Has difficulty losing weight ? ?Wt Readings from Last 3 Encounters:  ?09/26/21 228 lb 9.6 oz (103.7 kg)  ?08/14/21 212 lb 15.4 oz (96.6 kg)  ?05/02/21 213 lb (96.6 kg)  ? ?OLIGOMENORRHEA: This has been treated with birth control pills, no pretreatment estradiol levels available ? ?Prolactin level was normal ? ?Has variable blood pressure readings ?She thinks that her heart rate is fast in the office because of whitecoat syndrome ? ?BP Readings from Last 3 Encounters:  ?09/26/21 124/84  ?08/14/21 128/84  ?01/29/21 (!) 130/92  ? ? ? ? ?PHYSICAL EXAM: ? ?BP 124/84   Pulse (!) 108   Ht 5\' 5"  (1.651 m)   Wt 228 lb 9.6 oz (103.7 kg)   SpO2 99%   BMI 38.04 kg/m?  ? ?Repeat heart rate 96 regular ? ?Thyroid not palpably ?Biceps reflexes appear normal ? ?ASSESSMENT:  ? ?PSEUDOHYPOPARATHYROIDISM, familial ? ?She had mild baseline symptoms of tightness in her hands and face and cramping with significantly long baseline calcium of 6.9 ?Symptoms have been consistently controlled with treatment ? ?She is taking 0.25 mcg twice daily with calcium supplement once a day in the evening ?Calcium is again consistently normal around 9 ?Other electrolytes are normal ? ? ?HYPOTHYROIDISM: She has secondary hypothyroidism with mildly low free T4 level at  baseline ?As above she is now requiring only 50 mcg supplementation with adequate control of symptoms and good free T4 levels ? ?Whitecoat syndrome causing relatively fast heart rate and blood pressure ? ? ? ?PLAN:  ? ?Continue calcitriol 0.25 mcg twice daily ?Since she forgets to take calcium supplements twice daily consistently, can take it once a day again ? ?No change in levothyroxine dose of 50 mcg daily ? ?Follow-up in 6 months ? ? ? ? ? ?09/26/2021, 4:15 PM  ? ? ?

## 2021-11-12 ENCOUNTER — Telehealth (INDEPENDENT_AMBULATORY_CARE_PROVIDER_SITE_OTHER): Payer: BC Managed Care – PPO | Admitting: Child and Adolescent Psychiatry

## 2021-11-12 DIAGNOSIS — F3131 Bipolar disorder, current episode depressed, mild: Secondary | ICD-10-CM

## 2021-11-12 DIAGNOSIS — F410 Panic disorder [episodic paroxysmal anxiety] without agoraphobia: Secondary | ICD-10-CM | POA: Diagnosis not present

## 2021-11-12 DIAGNOSIS — F411 Generalized anxiety disorder: Secondary | ICD-10-CM

## 2021-11-12 MED ORDER — LAMOTRIGINE 25 MG PO TABS: 25.0000 mg | ORAL_TABLET | Freq: Two times a day (BID) | ORAL | 1 refills | Status: DC

## 2021-11-12 MED ORDER — TRAZODONE HCL 100 MG PO TABS
100.0000 mg | ORAL_TABLET | Freq: Every day | ORAL | 1 refills | Status: DC
Start: 2021-11-12 — End: 2021-12-31

## 2021-11-12 MED ORDER — BUSPIRONE HCL 10 MG PO TABS: 20.0000 mg | ORAL_TABLET | Freq: Two times a day (BID) | ORAL | 1 refills | Status: DC

## 2021-11-12 MED ORDER — LURASIDONE HCL 40 MG PO TABS: 40.0000 mg | ORAL_TABLET | Freq: Every day | ORAL | 1 refills | Status: DC

## 2021-11-12 NOTE — Progress Notes (Signed)
Virtual Visit via Video Note ? ?I connected with Colleen Tapia on 11/12/21 at  9:30 AM EDT by a video enabled telemedicine application and verified that I am speaking with the correct person using two identifiers. ? ?Location: ?Patient: College ?Provider: Office ?  ?I discussed the limitations of evaluation and management by telemedicine and the availability of in person appointments. The patient expressed understanding and agreed to proceed. ? ?I discussed the assessment and treatment plan with the patient. The patient was provided an opportunity to ask questions and all were answered. The patient agreed with the plan and demonstrated an understanding of the instructions. ?  ?The patient was advised to call back or seek an in-person evaluation if the symptoms worsen or if the condition fails to improve as anticipated. ? ?[name], MD ? ? ? ? ?[facility] MD/PA/NP OP Progress Note ? ?11/12/2021 9:36 AM ?Colleen Tapia  ?MRN:  [id number] ? ?Chief Complaint:  "I am okay.."(pt) ? ?HPI: This is a 26 year old Caucasian female with hx of generalized anxiety disorder, MDD, panic attacks previously on Zoloft and BuSpar since March 2019 and also tried on Wellbutrin XL which caused more irritability and therefore discontinued and currently prescribed Latuda 40 mg once a day and Lamictal 25 mg BID for concerns regarding bipolar disorder and BuSpar for anxiety.  ? ?Khalidah was seen and evaluated over telemedicine encounter for medication management follow-up.  She was present by herself and was evaluated alone.  She reports that she has been doing "okay", still applying for jobs and waiting to hear back from the couple of phone interviews that she had in last 1 month.  She reports that in her free time she has been going to gym 3 times a week and has a Systems analyst, lost about 11 pounds since the last appointment she had with this Clinical research associate.  She reports that she also goes for spinning classes once a week.  She reports that she  enjoys this activities.  She reports that she also has been going to her friend who lives in [place] and they go to watch soccer matches.  She reports that she has been seeing her therapist about once every 2 weeks.  She reports that her mood is "okay", fluctuates depending on the situation, usually more irritable at home but good outside.  Denies any low lows or high highs.  Also reports that her anxiety is stable and rates it at 5 out of 10, 10 being most anxious.  She denies problems with sleep or energy.  Reports she has a good appetite.  She denies any SI or HI.  She asked if Latuda causes weight gain, discussed that Kasandra Knudsen puts her at risk of weight gain, she verbalized understanding.  Discussed that despite being on Latuda she has lost 11 pounds and continuing with diet and exercise regimen will be helpful in regards to weight gain.  She verbalized understanding.  We discussed to continue with current medications and follow back in 6 to 8 weeks or earlier if needed. ? ? ? ?Visit Diagnosis:  ?  ICD-10-CM   ?1. Bipolar affective disorder, currently depressed, mild (HCC)  F31.31 lamoTRIgine (LAMICTAL) 25 MG tablet  ?  ?2. Generalized anxiety disorder with panic attacks  F41.1 busPIRone (BUSPAR) 10 MG tablet  ? F41.0   ?  ? ? ? ? ?Past Psychiatric History: reviewed today, no change.  ? ?Past Medical History:  ?Past Medical History:  ?Diagnosis Date  ? Anxiety   ?  ?  Past Surgical History:  ?Procedure Laterality Date  ? MOUTH SURGERY    ? ? ?Family Psychiatric History: As mentioned in initial H&P, reviewed today, no change  ?Family History:  ?Family History  ?Problem Relation Age of Onset  ? Anxiety disorder Mother   ? Depression Mother   ? ? ?Social History:  ?Social History  ? ?Socioeconomic History  ? Marital status: Single  ?  Spouse name: Not on file  ? Number of children: 0  ? Years of education: Not on file  ? Highest education level: Some college, no degree  ?Occupational History  ? Not on file  ?Tobacco Use   ? Smoking status: Never  ? Smokeless tobacco: Never  ?Vaping Use  ? Vaping Use: Never used  ?Substance and Sexual Activity  ? Alcohol use: Yes  ?  Alcohol/week: 3.0 - 5.0 standard drinks  ?  Types: 3 - 4 Shots of liquor per week  ? Drug use: Not Currently  ? Sexual activity: Not Currently  ?Other Topics Concern  ? Not on file  ?Social History Narrative  ? Not on file  ? ?Social Determinants of Health  ? ?Financial Resource Strain: Not on file  ?Food Insecurity: Not on file  ?Transportation Needs: Not on file  ?Physical Activity: Not on file  ?Stress: Not on file  ?Social Connections: Not on file  ? ? ?Allergies:  ?Allergies  ?Allergen Reactions  ? No Known Allergies   ? ? ?Metabolic Disorder Labs: ?No results found for: HGBA1C, MPG ?Lab Results  ?Component Value Date  ? PROLACTIN 13.5 02/01/2020  ? ?No results found for: CHOL, TRIG, HDL, CHOLHDL, VLDL, LDLCALC ?Lab Results  ?Component Value Date  ? TSH 3.700 04/23/2021  ? TSH 0.550 01/24/2021  ? ? ?Therapeutic Level Labs: ?No results found for: LITHIUM ?No results found for: VALPROATE ?No components found for:  CBMZ ? ?Current Medications: ?Current Outpatient Medications  ?Medication Sig Dispense Refill  ? busPIRone (BUSPAR) 10 MG tablet Take 2 tablets (20 mg total) by mouth 2 (two) times daily. 120 tablet 1  ? calcitRIOL (ROCALTROL) 0.25 MCG capsule TAKE 1 CAPSULE BY MOUTH EVERY MORNING AND 1 CAPSULE EVERY NIGHT AT BEDTIME 60 capsule 3  ? Calcium Carbonate-Vitamin D 600-400 MG-UNIT tablet Take by mouth.    ? fluticasone (FLONASE) 50 MCG/ACT nasal spray Place into the nose.    ? Lactobacillus (AZO COMPLETE FEMININE BALANCE PO) AZO Complete Feminine Balance    ? lamoTRIgine (LAMICTAL) 25 MG tablet Take 1 tablet (25 mg total) by mouth 2 (two) times daily. 60 tablet 1  ? levocetirizine (XYZAL) 5 MG tablet Take 5 mg by mouth every evening.    ? levothyroxine (SYNTHROID) 50 MCG tablet Take 1 tablet (50 mcg total) by mouth daily. 90 tablet 2  ? lurasidone (LATUDA) 40 MG  TABS tablet Take 1 tablet (40 mg total) by mouth daily with supper. 30 tablet 1  ? meloxicam (MOBIC) 15 MG tablet Take 1 tablet (15 mg total) by mouth daily. 30 tablet 1  ? meloxicam (MOBIC) 15 MG tablet Take 1 tablet by mouth daily.    ? norgestimate-ethinyl estradiol (ORTHO-CYCLEN) 0.25-35 MG-MCG tablet Take 1 tablet by mouth daily.    ? promethazine (PHENERGAN) 25 MG tablet Take 1 tablet (25 mg total) by mouth every 6 (six) hours as needed for nausea or vomiting. 8 tablet 0  ? traZODone (DESYREL) 100 MG tablet Take 1 tablet (100 mg total) by mouth at bedtime. 30 tablet 1  ? ?  Current Facility-Administered Medications  ?Medication Dose Route Frequency Provider Last Rate Last Admin  ? betamethasone acetate-betamethasone sodium phosphate (CELESTONE) injection 3 mg  3 mg Intra-articular Once Felecia Shelling, DPM      ? ? ? ?Musculoskeletal: ?Strength & Muscle Tone: unable to assess since visit was over the telemedicine. ?Gait & Station: unable to assess since visit was over the telemedicine. ?Patient leans: N/A ? ?Psychiatric Specialty Exam: ?ROSReview of 12 systems negative except as mentioned in HPI ?  ?There were no vitals taken for this visit.There is no height or weight on file to calculate BMI.  ?General Appearance: Casual and Fairly Groomed  ?Eye Contact:  Good  ?Speech:  Clear and Coherent and Normal Rate  ?Volume:  Normal  ?Mood:  "ok"  ?Affect:  Appropriate, Congruent, and Restricted  ?Thought Process:  Goal Directed and Linear  ?Orientation:  Full (Time, Place, and Person)  ?Thought Content: Logical   ?Suicidal Thoughts:  No  ?Homicidal Thoughts:  No  ?Memory:  Immediate;   Fair ?Recent;   Fair ?Remote;   Fair  ?Judgement:  Fair  ?Insight:  Fair  ?Psychomotor Activity:  Normal  ?Concentration:  Concentration: Good and Attention Span: Good  ?Recall:  Good  ?Fund of Knowledge: Good  ?Language: Good  ?Akathisia:  No  ?  ?AIMS (if indicated): not done  ?Assets:  Communication Skills ?Desire for  Improvement ?Financial Resources/Insurance ?Housing ?Leisure Time ?Physical Health ?Social Support ?Talents/Skills ?Transportation ?Vocational/Educational  ?ADL's:  Intact  ?Cognition: WNL  ?Sleep:   ?Good  ? ?Screening

## 2021-12-11 ENCOUNTER — Other Ambulatory Visit: Payer: Self-pay | Admitting: Endocrinology

## 2021-12-31 ENCOUNTER — Telehealth (INDEPENDENT_AMBULATORY_CARE_PROVIDER_SITE_OTHER): Payer: BC Managed Care – PPO | Admitting: Child and Adolescent Psychiatry

## 2021-12-31 DIAGNOSIS — F3175 Bipolar disorder, in partial remission, most recent episode depressed: Secondary | ICD-10-CM | POA: Diagnosis not present

## 2021-12-31 DIAGNOSIS — F411 Generalized anxiety disorder: Secondary | ICD-10-CM

## 2021-12-31 DIAGNOSIS — F410 Panic disorder [episodic paroxysmal anxiety] without agoraphobia: Secondary | ICD-10-CM | POA: Diagnosis not present

## 2021-12-31 MED ORDER — TRAZODONE HCL 100 MG PO TABS: 100.0000 mg | ORAL_TABLET | Freq: Every day | ORAL | 1 refills | Status: DC

## 2021-12-31 MED ORDER — LAMOTRIGINE 25 MG PO TABS: 25.0000 mg | ORAL_TABLET | Freq: Two times a day (BID) | ORAL | 1 refills | Status: DC

## 2021-12-31 MED ORDER — BUSPIRONE HCL 10 MG PO TABS: 20.0000 mg | ORAL_TABLET | Freq: Two times a day (BID) | ORAL | 1 refills | Status: DC

## 2021-12-31 MED ORDER — LURASIDONE HCL 40 MG PO TABS: 40.0000 mg | ORAL_TABLET | Freq: Every day | ORAL | 1 refills | Status: DC

## 2021-12-31 NOTE — Progress Notes (Signed)
Virtual Visit via Video Note  I connected with Colleen Tapia on 12/31/21 at  9:30 AM EDT by a video enabled telemedicine application and verified that I am speaking with the correct person using two identifiers.  Location: Patient: [employer] Provider: Office   I discussed the limitations of evaluation and management by telemedicine and the availability of in person appointments. The patient expressed understanding and agreed to proceed.  I discussed the assessment and treatment plan with the patient. The patient was provided an opportunity to ask questions and all were answered. The patient agreed with the plan and demonstrated an understanding of the instructions.   The patient was advised to call back or seek an in-person evaluation if the symptoms worsen or if the condition fails to improve as anticipated.  Colleen Smalling, MD     [facility] MD/PA/NP OP Progress Note  12/31/2021 10:18 AM Colleen Tapia  MRN:  [id number]  Chief Complaint:  "I am doing ok..."  HPI: This is a 26 year old Caucasian female with hx of generalized anxiety disorder, MDD, panic attacks previously on Zoloft and BuSpar since March 2019 and also tried on Wellbutrin XL which caused more irritability and therefore discontinued and currently prescribed Latuda 40 mg once a day and Lamictal 25 mg BID for concerns regarding bipolar disorder and BuSpar for anxiety.   Colleen Tapia was seen and evaluated over telemedicine encounter for medication management follow-up.  She was present by herself and was evaluated alone.  She reports that she has been doing "okay", has been doing the same as compared to last appointment in regards of her mental health, does report some anxiety and stress related to finding jobs but reports that it is manageable.  She has continued to work out 4 days a week, applying to jobs.  Overall she reports that her mood has been stable, denies any low lows or high highs.  She reports that she is sleeping well, sleeps  about 8 hours, energy has been good, denies problems with appetite, denies any SI or HI.  She reports that she is planning to move out of her mom's house and leaving [place] with her friend in July, and is looking forward to that.  We discussed to continue with current medications and follow back in 6 to 8 weeks or earlier if needed.  She continues to see her therapist and recommended to continue.  Visit Diagnosis:    ICD-10-CM   1. Generalized anxiety disorder with panic attacks  F41.1 busPIRone (BUSPAR) 10 MG tablet   F41.0     2. Bipolar disorder, in partial remission, most recent episode depressed (HCC)  F31.75 lamoTRIgine (LAMICTAL) 25 MG tablet         Past Psychiatric History: reviewed today, no change.   Past Medical History:  Past Medical History:  Diagnosis Date   Anxiety     Past Surgical History:  Procedure Laterality Date   MOUTH SURGERY      Family Psychiatric History: As mentioned in initial H&P, reviewed today, no change  Family History:  Family History  Problem Relation Age of Onset   Anxiety disorder Mother    Depression Mother     Social History:  Social History   Socioeconomic History   Marital status: Single    Spouse name: Not on file   Number of children: 0   Years of education: Not on file   Highest education level: Some college, no degree  Occupational History   Not on file  Tobacco Use   Smoking status: Never   Smokeless tobacco: Never  Vaping Use   Vaping Use: Never used  Substance and Sexual Activity   Alcohol use: Yes    Alcohol/week: 3.0 - 5.0 standard drinks    Types: 3 - 4 Shots of liquor per week   Drug use: Not Currently   Sexual activity: Not Currently  Other Topics Concern   Not on file  Social History Narrative   Not on file   Social Determinants of Health   Financial Resource Strain: Not on file  Food Insecurity: Not on file  Transportation Needs: Not on file  Physical Activity: Not on file  Stress: Not on file   Social Connections: Not on file    Allergies:  Allergies  Allergen Reactions   No Known Allergies     Metabolic Disorder Labs: No results found for: HGBA1C, MPG Lab Results  Component Value Date   PROLACTIN 13.5 02/01/2020   No results found for: CHOL, TRIG, HDL, CHOLHDL, VLDL, LDLCALC Lab Results  Component Value Date   TSH 3.700 04/23/2021   TSH 0.550 01/24/2021    Therapeutic Level Labs: No results found for: LITHIUM No results found for: VALPROATE No components found for:  CBMZ  Current Medications: Current Outpatient Medications  Medication Sig Dispense Refill   busPIRone (BUSPAR) 10 MG tablet Take 2 tablets (20 mg total) by mouth 2 (two) times daily. 120 tablet 1   calcitRIOL (ROCALTROL) 0.25 MCG capsule TAKE ONE CAPSULE BY MOUTH EVERY MORNING AND TAKE ONE CAPSULE BY MOUTH EVERY NIGHT AT BEDTIME 60 capsule 3   Calcium Carbonate-Vitamin D 600-400 MG-UNIT tablet Take by mouth.     fluticasone (FLONASE) 50 MCG/ACT nasal spray Place into the nose.     Lactobacillus (AZO COMPLETE FEMININE BALANCE PO) AZO Complete Feminine Balance     lamoTRIgine (LAMICTAL) 25 MG tablet Take 1 tablet (25 mg total) by mouth 2 (two) times daily. 60 tablet 1   levocetirizine (XYZAL) 5 MG tablet Take 5 mg by mouth every evening.     levothyroxine (SYNTHROID) 50 MCG tablet Take 1 tablet (50 mcg total) by mouth daily. 90 tablet 2   lurasidone (LATUDA) 40 MG TABS tablet Take 1 tablet (40 mg total) by mouth daily with supper. 30 tablet 1   meloxicam (MOBIC) 15 MG tablet Take 1 tablet (15 mg total) by mouth daily. 30 tablet 1   meloxicam (MOBIC) 15 MG tablet Take 1 tablet by mouth daily.     norgestimate-ethinyl estradiol (ORTHO-CYCLEN) 0.25-35 MG-MCG tablet Take 1 tablet by mouth daily.     promethazine (PHENERGAN) 25 MG tablet Take 1 tablet (25 mg total) by mouth every 6 (six) hours as needed for nausea or vomiting. 8 tablet 0   traZODone (DESYREL) 100 MG tablet Take 1 tablet (100 mg total) by  mouth at bedtime. 30 tablet 1   Current Facility-Administered Medications  Medication Dose Route Frequency Provider Last Rate Last Admin   betamethasone acetate-betamethasone sodium phosphate (CELESTONE) injection 3 mg  3 mg Intra-articular Once Felecia ShellingEvans, Brent M, DPM         Musculoskeletal: Strength & Muscle Tone: unable to assess since visit was over the telemedicine. Gait & Station: unable to assess since visit was over the telemedicine. Patient leans: N/A  Psychiatric Specialty Exam: ROSReview of 12 systems negative except as mentioned in HPI   There were no vitals taken for this visit.There is no height or weight on file to calculate BMI.  General  Appearance: Casual and Fairly Groomed  Eye Contact:  Good  Speech:  Clear and Coherent and Normal Rate  Volume:  Normal  Mood:  "ok"  Affect:  Appropriate, Congruent, and Restricted  Thought Process:  Goal Directed and Linear  Orientation:  Full (Time, Place, and Person)  Thought Content: Logical   Suicidal Thoughts:  No  Homicidal Thoughts:  No  Memory:  Immediate;   Fair Recent;   Fair Remote;   Fair  Judgement:  Fair  Insight:  Fair  Psychomotor Activity:  Normal  Concentration:  Concentration: Good and Attention Span: Good  Recall:  Good  Fund of Knowledge: Good  Language: Good  Akathisia:  No    AIMS (if indicated): not done  Assets:  Communication Skills Desire for Improvement Financial Resources/Insurance Housing Leisure Time Physical Health Social Support Talents/Skills Transportation Vocational/Educational  ADL's:  Intact  Cognition: WNL  Sleep:   Good   Screenings: Flowsheet Row ED from 08/14/2021 in [facility]  C-SSRS RISK CATEGORY No Risk        Assessment and Plan:   26 yo F with hx of anxiety disorder on Zoloft since March of 2019 and was previously in counseling at [facility].  She is diagnosed with generalized anxiety  disorder with panic attacks and has responded well to Zoloft 100 mg once a day and BuSpar 10 mg 2 times a day. However recently has noticed more mood fluctuation, irritability, agitation, depressed mood, sleep changes which initially was thought to be consistent with MDD. However Zoloft and Wellbutrin both worsened her symptoms, and she continued to experience mood lability, irritability, agitation, depressed mood, sleep changes, recently noted self applying to 100 jobs and there is a strong +ve family of bipolar disorder and therefore considering the dx of Bipolar Disorder.  She was started on Latuda as pt's mother and brother has responded well to it.   At her appointment in 04/2021, she reported worsening of mood lability, anxiety and sleep in the context of  psychosocial stressors(not getting a job yet, overthinking about her future and relationships). She reported that she was more tearful, sad, has lack of motivation attending school, problems with sleep and energy, which appeared to be most likely in the context of her ongoing psychosocial stressors.   Update on 12/31/21 -she appears to have stability with her mood and anxiety on current medication regimen.  She is also continuing to see her therapist regularly as well as doing wellness activities such as going to gym about 4 days a week.     Plan: Problem 1: Mood(chronic and stable) - Continue with Lamictal 25 mg BID - Continue Latuda 40 mg daily with dinner  - Risks and benefits discussed.  - Ind therapy with Shara Blazing at [facility].  Problem 2: Generalized Anxiety with Panic attack(chronic andstable) Plan:           - Continue with Buspar 20 mg BID          - Follow up in 6-8 weeks or early if needed. .   Problem 3: Sleep Plan:  - Continue with Trazodone 100  mg QHS for sleep.         MDM = 2 or more chronic stable conditions + med management        Colleen Smalling, MD 12/31/2021, 10:18 AM

## 2022-01-24 ENCOUNTER — Other Ambulatory Visit: Payer: Self-pay

## 2022-01-24 DIAGNOSIS — E201 Pseudohypoparathyroidism: Secondary | ICD-10-CM

## 2022-01-24 MED ORDER — LEVOTHYROXINE SODIUM 50 MCG PO TABS: 50.0000 ug | ORAL_TABLET | Freq: Every day | ORAL | 2 refills | Status: DC

## 2022-02-19 ENCOUNTER — Telehealth (INDEPENDENT_AMBULATORY_CARE_PROVIDER_SITE_OTHER): Payer: BC Managed Care – PPO | Admitting: Child and Adolescent Psychiatry

## 2022-02-19 DIAGNOSIS — F411 Generalized anxiety disorder: Secondary | ICD-10-CM | POA: Diagnosis not present

## 2022-02-19 DIAGNOSIS — F3175 Bipolar disorder, in partial remission, most recent episode depressed: Secondary | ICD-10-CM

## 2022-02-19 DIAGNOSIS — F410 Panic disorder [episodic paroxysmal anxiety] without agoraphobia: Secondary | ICD-10-CM | POA: Diagnosis not present

## 2022-02-19 MED ORDER — LAMOTRIGINE 25 MG PO TABS: 25.0000 mg | ORAL_TABLET | Freq: Two times a day (BID) | ORAL | 1 refills | Status: DC

## 2022-02-19 MED ORDER — LURASIDONE HCL 40 MG PO TABS: 40.0000 mg | ORAL_TABLET | Freq: Every day | ORAL | 1 refills | Status: DC

## 2022-02-19 MED ORDER — BUSPIRONE HCL 10 MG PO TABS: 20.0000 mg | ORAL_TABLET | Freq: Two times a day (BID) | ORAL | 1 refills | Status: DC

## 2022-02-19 MED ORDER — TRAZODONE HCL 100 MG PO TABS
100.0000 mg | ORAL_TABLET | Freq: Every day | ORAL | 1 refills | Status: DC
Start: 2022-02-19 — End: 2022-05-02

## 2022-02-19 NOTE — Progress Notes (Signed)
Virtual Visit via Video Note  I connected with Colleen Tapia on 02/19/22 at  9:30 AM EDT by a video enabled telemedicine application and verified that I am speaking with the correct person using two identifiers.  Location: Patient: College Provider: Office   I discussed the limitations of evaluation and management by telemedicine and the availability of in person appointments. The patient expressed understanding and agreed to proceed.  I discussed the assessment and treatment plan with the patient. The patient was provided an opportunity to ask questions and all were answered. The patient agreed with the plan and demonstrated an understanding of the instructions.   The patient was advised to call back or seek an in-person evaluation if the symptoms worsen or if the condition fails to improve as anticipated.  [name], MD     [facility] MD/PA/NP OP Progress Note  02/19/2022 9:58 AM Colleen Tapia  MRN:  [id number]  Chief Complaint:  "I am ok..."  HPI: This is a 26 year old Caucasian female with hx of generalized anxiety disorder, MDD, panic attacks previously on Zoloft and BuSpar since March 2019 and also tried on Wellbutrin XL which caused more irritability and therefore discontinued and currently prescribed Latuda 40 mg once a day and Lamictal 25 mg BID for concerns regarding bipolar disorder and BuSpar for anxiety.   Jeany was seen and evaluated over telemedicine encounter for medication management follow-up.  She was present by herself and was evaluated alone. Appointment was attended by rotating medical students and pt provided informed consent to allow med student attend the appointment.   She denies any new concerns for today's appointment.  She reports that she has been doing "okay".  She reports that her mood has been "okay" and "normal", denies any high highs or low lows.  She reports that she has an interview today which will be her second review for [employer] job on  [employer], she is stressed about it but denies any other excessive worries or anxiety.  She reports that she has partially moved in with her friend in [place] but currently at parent's home.  She reports that she has been sleeping okay, likes to watch soccer and therefore because of the soccer World Cup, her sleep routine is disturbed.  She denies any SI or HI, eats well, reports that she has been spending time going to gym, lost about 30 pounds since March of this year.  She reports consistently taking her medications and denies any problems with them.  She has continued to see her therapist about every 2 weeks.  We discussed to continue with current medications because of stability in her symptoms.  We also discussed to obtain blood work -  hemoglobin A1c and lipid panel.  She reports that she has an appointment with her PCP in September and will ask them to do it.  Visit Diagnosis:    ICD-10-CM   1. Bipolar disorder, in partial remission, most recent episode depressed (HCC)  F31.75 lamoTRIgine (LAMICTAL) 25 MG tablet    2. Generalized anxiety disorder with panic attacks  F41.1 busPIRone (BUSPAR) 10 MG tablet   F41.0          Past Psychiatric History: reviewed today, no change.   Past Medical History:  Past Medical History:  Diagnosis Date   Anxiety     Past Surgical History:  Procedure Laterality Date   MOUTH SURGERY      Family Psychiatric History: As mentioned in initial H&P, reviewed today, no  change  Family History:  Family History  Problem Relation Age of Onset   Anxiety disorder Mother    Depression Mother     Social History:  Social History   Socioeconomic History   Marital status: Single    Spouse name: Not on file   Number of children: 0   Years of education: Not on file   Highest education level: Some college, no degree  Occupational History   Not on file  Tobacco Use   Smoking status: Never   Smokeless tobacco: Never  Vaping Use   Vaping Use: Never used   Substance and Sexual Activity   Alcohol use: Yes    Alcohol/week: 3.0 - 5.0 standard drinks of alcohol    Types: 3 - 4 Shots of liquor per week   Drug use: Not Currently   Sexual activity: Not Currently  Other Topics Concern   Not on file  Social History Narrative   Not on file   Social Determinants of Health   Financial Resource Strain: Low Risk  (06/23/2018)   Overall Financial Resource Strain (CARDIA)    Difficulty of Paying Living Expenses: Not hard at all  Food Insecurity: No Food Insecurity (06/23/2018)   Hunger Vital Sign    Worried About Running Out of Food in the Last Year: Never true    Ran Out of Food in the Last Year: Never true  Transportation Needs: No Transportation Needs (06/23/2018)   PRAPARE - Administrator, Civil Service (Medical): No    Lack of Transportation (Non-Medical): No  Physical Activity: Sufficiently Active (06/23/2018)   Exercise Vital Sign    Days of Exercise per Week: 5 days    Minutes of Exercise per Session: 50 min  Stress: Not on file  Social Connections: Unknown (06/23/2018)   Social Connection and Isolation Panel [NHANES]    Frequency of Communication with Friends and Family: Not on file    Frequency of Social Gatherings with Friends and Family: Not on file    Attends Religious Services: More than 4 times per year    Active Member of [employer] or Organizations: Yes    Attends Banker Meetings: More than 4 times per year    Marital Status: Never married    Allergies:  Allergies  Allergen Reactions   No Known Allergies     Metabolic Disorder Labs: No results found for: "HGBA1C", "MPG" Lab Results  Component Value Date   PROLACTIN 13.5 02/01/2020   No results found for: "CHOL", "TRIG", "HDL", "CHOLHDL", "VLDL", "LDLCALC" Lab Results  Component Value Date   TSH 3.700 04/23/2021   TSH 0.550 01/24/2021    Therapeutic Level Labs: No results found for: "LITHIUM" No results found for: "VALPROATE" No  results found for: "CBMZ"  Current Medications: Current Outpatient Medications  Medication Sig Dispense Refill   busPIRone (BUSPAR) 10 MG tablet Take 2 tablets (20 mg total) by mouth 2 (two) times daily. 120 tablet 1   calcitRIOL (ROCALTROL) 0.25 MCG capsule TAKE ONE CAPSULE BY MOUTH EVERY MORNING AND TAKE ONE CAPSULE BY MOUTH EVERY NIGHT AT BEDTIME 60 capsule 3   Calcium Carbonate-Vitamin D 600-400 MG-UNIT tablet Take by mouth.     fluticasone (FLONASE) 50 MCG/ACT nasal spray Place into the nose.     Lactobacillus (AZO COMPLETE FEMININE BALANCE PO) AZO Complete Feminine Balance     lamoTRIgine (LAMICTAL) 25 MG tablet Take 1 tablet (25 mg total) by mouth 2 (two) times daily. 60 tablet 1  levocetirizine (XYZAL) 5 MG tablet Take 5 mg by mouth every evening.     levothyroxine (SYNTHROID) 50 MCG tablet Take 1 tablet (50 mcg total) by mouth daily. 90 tablet 2   lurasidone (LATUDA) 40 MG TABS tablet Take 1 tablet (40 mg total) by mouth daily with supper. 30 tablet 1   meloxicam (MOBIC) 15 MG tablet Take 1 tablet (15 mg total) by mouth daily. 30 tablet 1   meloxicam (MOBIC) 15 MG tablet Take 1 tablet by mouth daily.     norgestimate-ethinyl estradiol (ORTHO-CYCLEN) 0.25-35 MG-MCG tablet Take 1 tablet by mouth daily.     promethazine (PHENERGAN) 25 MG tablet Take 1 tablet (25 mg total) by mouth every 6 (six) hours as needed for nausea or vomiting. 8 tablet 0   traZODone (DESYREL) 100 MG tablet Take 1 tablet (100 mg total) by mouth at bedtime. 30 tablet 1   Current Facility-Administered Medications  Medication Dose Route Frequency Provider Last Rate Last Admin   betamethasone acetate-betamethasone sodium phosphate (CELESTONE) injection 3 mg  3 mg Intra-articular Once Felecia Shelling, DPM         Musculoskeletal: Strength & Muscle Tone: unable to assess since visit was over the telemedicine. Gait & Station: unable to assess since visit was over the telemedicine. Patient leans: N/A  Psychiatric  Specialty Exam: ROSReview of 12 systems negative except as mentioned in HPI   There were no vitals taken for this visit.There is no height or weight on file to calculate BMI.  General Appearance: Casual and Fairly Groomed  Eye Contact:  Good  Speech:  Clear and Coherent and Normal Rate  Volume:  Normal  Mood:  "okay.."  Affect:  Appropriate, Congruent, and Restricted  Thought Process:  Goal Directed and Linear  Orientation:  Full (Time, Place, and Person)  Thought Content: Logical   Suicidal Thoughts:  No  Homicidal Thoughts:  No  Memory:  Immediate;   Fair Recent;   Fair Remote;   Fair  Judgement:  Fair  [facility]:  Fair  Psychomotor Activity:  Normal  Concentration:  Concentration: Good and Attention Span: Good  Recall:  Good  Fund of Knowledge: Good  Language: Good  Akathisia:  No    AIMS (if indicated): not done  Assets:  Communication Skills Desire for Improvement Financial Resources/Insurance Housing Leisure Time Physical Health Social Support Talents/Skills Transportation Vocational/Educational  ADL's:  Intact  Cognition: WNL  Sleep:   Good   Screenings: Flowsheet Row ED from 08/14/2021 in [facility]  C-SSRS RISK CATEGORY No Risk        Assessment and Plan:   26 yo F with hx of anxiety disorder on Zoloft since March of 2019 and was previously in counseling at [facility].  She is diagnosed with generalized anxiety disorder with panic attacks and has responded well to Zoloft 100 mg once a day and BuSpar 10 mg 2 times a day. However recently has noticed more mood fluctuation, irritability, agitation, depressed mood, sleep changes which initially was thought to be consistent with MDD. However Zoloft and Wellbutrin both worsened her symptoms, and she continued to experience mood lability, irritability, agitation, depressed mood, sleep changes, recently noted self applying to 100 jobs and there  is a strong +ve family of bipolar disorder and therefore considering the dx of Bipolar Disorder.  She was started on Latuda as pt's mother and brother has responded well to it.   At her appointment in 04/2021,  she reported worsening of mood lability, anxiety and sleep in the context of  psychosocial stressors(not getting a job yet, overthinking about her future and relationships). She reported that she was more tearful, sad, has lack of motivation attending school, problems with sleep and energy, which appeared to be most likely in the context of her ongoing psychosocial stressors.   Update on 02/19/22 -she appears to have continued stability with her mood and anxiety on current medication regimen. Reports stressors such as moving and interviewing, and seems to be managing it well.  She is also continuing to see her therapist regularly as well as doing wellness activities such as going to gym.     Plan: Problem 1: Mood(chronic and stable) - Continue with Lamictal 25 mg BID - Continue Latuda 40 mg daily with dinner  - Risks and benefits discussed.  - Ind therapy with Shara Blazing at [facility].  Problem 2: Generalized Anxiety with Panic attack(chronic andstable) Plan:           - Continue with Buspar 20 mg BID          - Follow up in 6-8 weeks or early if needed. .   Problem 3: Sleep Plan:  - Continue with Trazodone 100  mg QHS for sleep.         MDM = 2 or more chronic stable conditions + med management        [name], MD 02/19/2022, 9:58 AM

## 2022-03-28 ENCOUNTER — Other Ambulatory Visit: Payer: Self-pay

## 2022-04-02 LAB — BASIC METABOLIC PANEL
BUN/Creatinine Ratio: 14 (ref 9–23)
BUN: 12 mg/dL (ref 6–20)
CO2: 21 mmol/L (ref 20–29)
Calcium: 9.5 mg/dL (ref 8.7–10.2)
Chloride: 100 mmol/L (ref 96–106)
Creatinine, Ser: 0.88 mg/dL (ref 0.57–1.00)
Glucose: 85 mg/dL (ref 70–99)
Potassium: 4.6 mmol/L (ref 3.5–5.2)
Sodium: 137 mmol/L (ref 134–144)
eGFR: 94 mL/min/{1.73_m2} (ref >59)

## 2022-04-02 LAB — T4, FREE: Free T4: 1.23 ng/dL (ref 0.82–1.77)

## 2022-04-03 ENCOUNTER — Ambulatory Visit (INDEPENDENT_AMBULATORY_CARE_PROVIDER_SITE_OTHER): Payer: BC Managed Care – PPO | Admitting: Endocrinology

## 2022-04-03 VITALS — BP 108/72 | HR 76 | Ht 65.0 in | Wt 199.2 lb

## 2022-04-03 DIAGNOSIS — E038 Other specified hypothyroidism: Secondary | ICD-10-CM | POA: Diagnosis not present

## 2022-04-03 DIAGNOSIS — E201 Pseudohypoparathyroidism: Secondary | ICD-10-CM

## 2022-04-03 NOTE — Patient Instructions (Signed)
Take 1 Calcitriol alternating with 2

## 2022-04-03 NOTE — Progress Notes (Signed)
Patient ID: Colleen Tapia, female   DOB: 19-Jul-1996, 26 y.o.   MRN: [id number]             Chief complaint: Endocrinology follow-up  History of Present Illness:  Pseudohypoparathyroidism   Background history: She went for routine visit with her PCP and was found to have a calcium was 6.9 in June 2021 She thinks that her calcium probably had been low previously also Ionized calcium was also low at 3.7, normal >4.5 PTH level 384  RECENT HISTORY:  She has had symptomatic calcium at the time of diagnosis and has been treated with calcitriol and calcium  Her pretreatment symptoms were tightness and cramping in her fingers, at times also a tightness in her face making it difficult to talk She was also complaining of feeling tired and having headaches   Calcium levels have been consistently normal with taking CALCITRIOL 0.25 mcg twice daily She takes her prescriptions with the help of pillbox regularly Has been continued on calcium supplements, using Os-Cal  Not taking calcium in the morning since she takes her Synthroid and only in the evening as before  No recent symptoms of muscle twitching or tightening in hands or face and no cramping in muscles Calcium is consistently normal around 9 but relatively higher at 9.5 now  Lab Results  Component Value Date   CALCIUM 9.5 04/01/2022   CALCIUM 9.2 09/24/2021   CALCIUM 8.2 (L) 08/14/2021   CALCIUM 9.0 04/23/2021   CALCIUM 8.9 01/24/2021   CALCIUM 8.7 10/02/2020   Lab Results  Component Value Date   CALCIUM 9.5 04/01/2022   PHOS 4.2 07/13/2020    Other problems addressed today: See review of systems   Past Medical History:  Diagnosis Date   Anxiety     Past Surgical History:  Procedure Laterality Date   MOUTH SURGERY      Family History  Problem Relation Age of Onset   Anxiety disorder Mother    Depression Mother     Social History:  reports that she has never smoked. She has never used smokeless tobacco. She  reports current alcohol use of about 3.0 - 5.0 standard drinks of alcohol per week. She reports that she does not currently use drugs.  Allergies:  Allergies  Allergen Reactions   No Known Allergies     Allergies as of 04/03/2022       Reactions   No Known Allergies         Medication List        Accurate as of April 03, 2022 10:47 AM. If you have any questions, ask your nurse or doctor.          AZO COMPLETE FEMININE BALANCE PO AZO Complete Feminine Balance   busPIRone 10 MG tablet Commonly known as: BUSPAR Take 2 tablets (20 mg total) by mouth 2 (two) times daily.   calcitRIOL 0.25 MCG capsule Commonly known as: ROCALTROL TAKE ONE CAPSULE BY MOUTH EVERY MORNING AND TAKE ONE CAPSULE BY MOUTH EVERY NIGHT AT BEDTIME   Calcium Carbonate-Vitamin D 600-400 MG-UNIT tablet Take by mouth.   fluticasone 50 MCG/ACT nasal spray Commonly known as: FLONASE Place into the nose.   lamoTRIgine 25 MG tablet Commonly known as: LAMICTAL Take 1 tablet (25 mg total) by mouth 2 (two) times daily.   levocetirizine 5 MG tablet Commonly known as: XYZAL Take 5 mg by mouth every evening.   levothyroxine 50 MCG tablet Commonly known as: SYNTHROID Take 1 tablet (50 mcg total)  by mouth daily.   lurasidone 40 MG Tabs tablet Commonly known as: LATUDA Take 1 tablet (40 mg total) by mouth daily with supper.   meloxicam 15 MG tablet Commonly known as: MOBIC Take 1 tablet (15 mg total) by mouth daily.   meloxicam 15 MG tablet Commonly known as: MOBIC Take 1 tablet by mouth daily.   norgestimate-ethinyl estradiol 0.25-35 MG-MCG tablet Commonly known as: ORTHO-CYCLEN Take 1 tablet by mouth daily.   promethazine 25 MG tablet Commonly known as: PHENERGAN Take 1 tablet (25 mg total) by mouth every 6 (six) hours as needed for nausea or vomiting.   traZODone 100 MG tablet Commonly known as: DESYREL Take 1 tablet (100 mg total) by mouth at bedtime.           Review of  Systems  HYPOTHYROIDISM:   She had been complaining of fatigue and weight gain but no cold intolerance at her initial visit  Had a slightly low baseline free T4 level of 0.59 With levothyroxine supplements her fatigue improved  Her levothyroxine dose was decreased to 50 mcg in 7/22 when she had a high normal free T4 and symptoms of heat intolerance  Overall she feels fairly good with no unusual fatigue, hair loss or cold intolerance  She takes her levothyroxine before breakfast daily  Thyroid levels as follows  Lab Results  Component Value Date   TSH 3.700 04/23/2021   TSH 0.550 01/24/2021   TSH 0.83 10/02/2020   FREET4 1.23 04/01/2022   FREET4 1.09 09/24/2021   FREET4 1.06 04/23/2021     Has now been losing weight significantly with regular exercise and consistent diet  Wt Readings from Last 3 Encounters:  04/03/22 199 lb 3.2 oz (90.4 kg)  09/26/21 228 lb 9.6 oz (103.7 kg)  08/14/21 212 lb 15.4 oz (96.6 kg)   OLIGOMENORRHEA: This has been treated with birth control pills, no pretreatment estradiol levels available  Prolactin level was normal  Has variable blood pressure readings   BP Readings from Last 3 Encounters:  04/03/22 108/72  09/26/21 124/84  08/14/21 128/84      PHYSICAL EXAM:  BP 108/72   Pulse 76   Ht 5\' 5"  (1.651 m)   Wt 199 lb 3.2 oz (90.4 kg)   SpO2 98%   BMI 33.15 kg/m   No swelling of the eyes or face  Biceps reflexes appear normal, difficult to elicit  ASSESSMENT:   PSEUDOHYPOPARATHYROIDISM, familial  She had mild baseline symptoms of tightness in her hands and face and cramping with significantly long baseline calcium of 6.9 Symptoms have been consistently controlled with calcitriol and calcium supplements  She is taking 0.25 mcg twice daily with calcium supplement once a day in the evening Calcium is again consistently normal, now trending higher at 9.5  HYPOTHYROIDISM: She has secondary hypothyroidism with mildly low free  T4 level at baseline No recent fatigue and free T4 is consistently normal Has lost weight for other reasons    PLAN:   Continue calcitriol 0.25 mcg but she will alternate once a day with twice a day Continue calcium once a day in the evening  No change in levothyroxine dose of 50 mcg daily  Follow-up in 6 months     Jamani Bearce 04/03/2022, 10:47 AM

## 2022-04-08 ENCOUNTER — Other Ambulatory Visit: Payer: Self-pay | Admitting: Endocrinology

## 2022-04-16 ENCOUNTER — Other Ambulatory Visit: Payer: Self-pay | Admitting: Child and Adolescent Psychiatry

## 2022-04-16 DIAGNOSIS — F410 Panic disorder [episodic paroxysmal anxiety] without agoraphobia: Secondary | ICD-10-CM

## 2022-05-02 ENCOUNTER — Telehealth (INDEPENDENT_AMBULATORY_CARE_PROVIDER_SITE_OTHER): Payer: BC Managed Care – PPO | Admitting: Child and Adolescent Psychiatry

## 2022-05-02 DIAGNOSIS — F410 Panic disorder [episodic paroxysmal anxiety] without agoraphobia: Secondary | ICD-10-CM | POA: Diagnosis not present

## 2022-05-02 DIAGNOSIS — F411 Generalized anxiety disorder: Secondary | ICD-10-CM

## 2022-05-02 DIAGNOSIS — F3175 Bipolar disorder, in partial remission, most recent episode depressed: Secondary | ICD-10-CM

## 2022-05-02 MED ORDER — TRAZODONE HCL 100 MG PO TABS: 100.0000 mg | ORAL_TABLET | Freq: Every day | ORAL | 1 refills | Status: DC

## 2022-05-02 MED ORDER — LURASIDONE HCL 40 MG PO TABS: 40.0000 mg | ORAL_TABLET | Freq: Every day | ORAL | 1 refills | Status: DC

## 2022-05-02 MED ORDER — LAMOTRIGINE 25 MG PO TABS: 25.0000 mg | ORAL_TABLET | Freq: Two times a day (BID) | ORAL | 1 refills | Status: DC

## 2022-05-02 MED ORDER — BUSPIRONE HCL 10 MG PO TABS: 20.0000 mg | ORAL_TABLET | Freq: Two times a day (BID) | ORAL | 1 refills | Status: DC

## 2022-05-02 NOTE — Progress Notes (Signed)
Virtual Visit via Video Note  I connected with Colleen Tapia on 05/02/22 at  9:30 AM EDT by a video enabled telemedicine application and verified that I am speaking with the correct person using two identifiers.  Location: Patient: College Provider: Office   I discussed the limitations of evaluation and management by telemedicine and the availability of in person appointments. The patient expressed understanding and agreed to proceed.  I discussed the assessment and treatment plan with the patient. The patient was provided an opportunity to ask questions and all were answered. The patient agreed with the plan and demonstrated an understanding of the instructions.   The patient was advised to call back or seek an in-person evaluation if the symptoms worsen or if the condition fails to improve as anticipated.  Colleen Smalling, MD     [facility] MD/PA/NP OP Progress Note  05/02/2022 9:58 AM Colleen Tapia  MRN:  [id number]  Chief Complaint:  "I am okay..."   HPI: This is a 26 year old Caucasian female with hx of generalized anxiety disorder, MDD, panic attacks previously on Zoloft and BuSpar since March 2019 and also tried on Wellbutrin XL which caused more irritability and therefore discontinued and currently prescribed Latuda 40 mg once a day and Lamictal 25 mg BID for concerns regarding bipolar disorder and BuSpar for anxiety.   Colleen Tapia was seen and evaluated over telemedicine encounter for medication management follow-up.  She was present by herself and was evaluated alone.  She denies any new concerns for today's appointment.  She reports that she has been doing "okay", still trying to find a job which has been irritating for her.  She reports that she has applied to all kinds of jobs and waiting to hear back.  She reports that her mood has been "okay", denies any high highs or low lows, reports anxiety related to not having jobs, but still manageable.  She reports that she continues to go to  the gym to work out, sees a therapist every 2 weeks, sleeping well, denies any SI or HI.  She also reports complete adherence to her medications.  We discussed to continue with current medications and therapy and follow back in about 2 months or earlier if needed.  She verbalized understanding and agreed with this plan.  She had her blood work done about a month ago, was notable for slight elevated total cholesterol as well as borderline LDL.  Reviewed the results with her.   Visit Diagnosis:    ICD-10-CM   1. Bipolar disorder, in partial remission, most recent episode depressed (HCC)  F31.75 lamoTRIgine (LAMICTAL) 25 MG tablet    2. Generalized anxiety disorder with panic attacks  F41.1 busPIRone (BUSPAR) 10 MG tablet   F41.0          Past Psychiatric History: reviewed today, no change.   Past Medical History:  Past Medical History:  Diagnosis Date   Anxiety     Past Surgical History:  Procedure Laterality Date   MOUTH SURGERY      Family Psychiatric History: As mentioned in initial H&P, reviewed today, no change  Family History:  Family History  Problem Relation Age of Onset   Anxiety disorder Mother    Depression Mother     Social History:  Social History   Socioeconomic History   Marital status: Single    Spouse name: Not on file   Number of children: 0   Years of education: Not on file   Highest education level:  Some college, no degree  Occupational History   Not on file  Tobacco Use   Smoking status: Never   Smokeless tobacco: Never  Vaping Use   Vaping Use: Never used  Substance and Sexual Activity   Alcohol use: Yes    Alcohol/week: 3.0 - 5.0 standard drinks of alcohol    Types: 3 - 4 Shots of liquor per week   Drug use: Not Currently   Sexual activity: Not Currently  Other Topics Concern   Not on file  Social History Narrative   Not on file   Social Determinants of Health   Financial Resource Strain: Low Risk  (06/23/2018)   Overall  Financial Resource Strain (CARDIA)    Difficulty of Paying Living Expenses: Not hard at all  Food Insecurity: No Food Insecurity (06/23/2018)   Hunger Vital Sign    Worried About Running Out of Food in the Last Year: Never true    Ran Out of Food in the Last Year: Never true  Transportation Needs: No Transportation Needs (06/23/2018)   PRAPARE - Administrator, Civil Service (Medical): No    Lack of Transportation (Non-Medical): No  Physical Activity: Sufficiently Active (06/23/2018)   Exercise Vital Sign    Days of Exercise per Week: 5 days    Minutes of Exercise per Session: 50 min  Stress: Not on file  Social Connections: Unknown (06/23/2018)   Social Connection and Isolation Panel [NHANES]    Frequency of Communication with Friends and Family: Not on file    Frequency of Social Gatherings with Friends and Family: Not on file    Attends Religious Services: More than 4 times per year    Active Member of [employer] or Organizations: Yes    Attends Banker Meetings: More than 4 times per year    Marital Status: Never married    Allergies:  Allergies  Allergen Reactions   No Known Allergies     Metabolic Disorder Labs: No results found for: "HGBA1C", "MPG" Lab Results  Component Value Date   PROLACTIN 13.5 02/01/2020   No results found for: "CHOL", "TRIG", "HDL", "CHOLHDL", "VLDL", "LDLCALC" Lab Results  Component Value Date   TSH 3.700 04/23/2021   TSH 0.550 01/24/2021    Therapeutic Level Labs: No results found for: "LITHIUM" No results found for: "VALPROATE" No results found for: "CBMZ"  Current Medications: Current Outpatient Medications  Medication Sig Dispense Refill   busPIRone (BUSPAR) 10 MG tablet Take 2 tablets (20 mg total) by mouth 2 (two) times daily. 120 tablet 1   calcitRIOL (ROCALTROL) 0.25 MCG capsule TAKE 1 CAPSULE BY MOUTH TWICE A DAY IN THE MORNING AND IN THE EVENING 60 capsule 3   Calcium Carbonate-Vitamin D 600-400 MG-UNIT  tablet Take by mouth.     fluticasone (FLONASE) 50 MCG/ACT nasal spray Place into the nose.     Lactobacillus (AZO COMPLETE FEMININE BALANCE PO) AZO Complete Feminine Balance     lamoTRIgine (LAMICTAL) 25 MG tablet Take 1 tablet (25 mg total) by mouth 2 (two) times daily. 60 tablet 1   levocetirizine (XYZAL) 5 MG tablet Take 5 mg by mouth every evening.     levothyroxine (SYNTHROID) 50 MCG tablet Take 1 tablet (50 mcg total) by mouth daily. 90 tablet 2   lurasidone (LATUDA) 40 MG TABS tablet Take 1 tablet (40 mg total) by mouth daily with supper. 30 tablet 1   meloxicam (MOBIC) 15 MG tablet Take 1 tablet (15 mg total) by mouth  daily. 30 tablet 1   meloxicam (MOBIC) 15 MG tablet Take 1 tablet by mouth daily.     norgestimate-ethinyl estradiol (ORTHO-CYCLEN) 0.25-35 MG-MCG tablet Take 1 tablet by mouth daily.     promethazine (PHENERGAN) 25 MG tablet Take 1 tablet (25 mg total) by mouth every 6 (six) hours as needed for nausea or vomiting. 8 tablet 0   traZODone (DESYREL) 100 MG tablet Take 1 tablet (100 mg total) by mouth at bedtime. 30 tablet 1   Current Facility-Administered Medications  Medication Dose Route Frequency Provider Last Rate Last Admin   betamethasone acetate-betamethasone sodium phosphate (CELESTONE) injection 3 mg  3 mg Intra-articular Once Felecia Shelling, DPM         Musculoskeletal: Strength & Muscle Tone: unable to assess since visit was over the telemedicine. Gait & Station: unable to assess since visit was over the telemedicine. Patient leans: N/A  Psychiatric Specialty Exam: ROSReview of 12 systems negative except as mentioned in HPI   There were no vitals taken for this visit.There is no height or weight on file to calculate BMI.  General Appearance: Casual and Fairly Groomed  Eye Contact:  Good  Speech:  Clear and Coherent and Normal Rate  Volume:  Normal  Mood:  "okay.."  Affect:  Appropriate, Congruent, and Restricted  Thought Process:  Goal Directed and  Linear  Orientation:  Full (Time, Place, and Person)  Thought Content: Logical   Suicidal Thoughts:  No  Homicidal Thoughts:  No  Memory:  Immediate;   Fair Recent;   Fair Remote;   Fair  Judgement:  Fair  Insight:  Fair  Psychomotor Activity:  Normal  Concentration:  Concentration: Good and Attention Span: Good  Recall:  Good  Fund of Knowledge: Good  Language: Good  Akathisia:  No    AIMS (if indicated): not done  Assets:  Communication Skills Desire for Improvement Financial Resources/Insurance Housing Leisure Time Physical Health Social Support Talents/Skills Transportation Vocational/Educational  ADL's:  Intact  Cognition: WNL  Sleep:   Good   Screenings: Flowsheet Row ED from 08/14/2021 [facility]  C-SSRS RISK CATEGORY No Risk        Assessment and Plan:   26 yo F with hx of anxiety disorder on Zoloft since March of 2019 and was previously in [facility].  She is diagnosed with generalized anxiety disorder with panic attacks and has responded well to Zoloft 100 mg once a day and BuSpar 10 mg 2 times a day. However recently has noticed more mood fluctuation, irritability, agitation, depressed mood, sleep changes which initially was thought to be consistent with MDD. However Zoloft and Wellbutrin both worsened her symptoms, and she continued to experience mood lability, irritability, agitation, depressed mood, sleep changes, recently noted self applying to 100 jobs and there is a strong +ve family of bipolar disorder and therefore considering the dx of Bipolar Disorder.  She was started on Latuda as pt's mother and brother has responded well to it.   At her appointment in 04/2021, she reported worsening of mood lability, anxiety and sleep in the context of  psychosocial stressors(not getting a job yet, overthinking about her future and relationships). She reported that she was more  tearful, sad, has lack of motivation attending school, problems with sleep and energy, which appeared to be most likely in the context of her ongoing psychosocial stressors.   Update on 05/02/22 -she appears to have continued stability  with her mood and anxiety on current medications and therapy.  Continues to have ongoing stressor of finding a job, seems to be managing it okay.  Plan as mentioned below.      Plan: Problem 1: Mood(chronic and stable) - Continue with Lamictal 25 mg BID - Continue Latuda 40 mg daily with dinner  - Risks and benefits discussed.  - Ind therapy with Gearldine Bienenstock at [facility].  Problem 2: Generalized Anxiety with Panic attack(chronic andstable) Plan:           - Continue with Buspar 20 mg BID          - Follow up in 6-8 weeks or early if needed. .   Problem 3: Sleep Plan:  - Continue with Trazodone 100  mg QHS for sleep.         MDM = 2 or more chronic stable conditions + med management        Orlene Erm, MD 05/02/2022, 9:58 AM

## 2022-07-04 ENCOUNTER — Telehealth (INDEPENDENT_AMBULATORY_CARE_PROVIDER_SITE_OTHER): Payer: BC Managed Care – PPO | Admitting: Child and Adolescent Psychiatry

## 2022-07-04 DIAGNOSIS — F3175 Bipolar disorder, in partial remission, most recent episode depressed: Secondary | ICD-10-CM | POA: Diagnosis not present

## 2022-07-04 DIAGNOSIS — F410 Panic disorder [episodic paroxysmal anxiety] without agoraphobia: Secondary | ICD-10-CM

## 2022-07-04 DIAGNOSIS — F411 Generalized anxiety disorder: Secondary | ICD-10-CM

## 2022-07-04 MED ORDER — LAMOTRIGINE 25 MG PO TABS: 25.0000 mg | ORAL_TABLET | Freq: Two times a day (BID) | ORAL | 1 refills | Status: DC

## 2022-07-04 MED ORDER — BUSPIRONE HCL 10 MG PO TABS: 20.0000 mg | ORAL_TABLET | Freq: Two times a day (BID) | ORAL | 1 refills | Status: DC

## 2022-07-04 MED ORDER — LURASIDONE HCL 40 MG PO TABS: 40.0000 mg | ORAL_TABLET | Freq: Every day | ORAL | 1 refills | Status: DC

## 2022-07-04 MED ORDER — TRAZODONE HCL 100 MG PO TABS: 100.0000 mg | ORAL_TABLET | Freq: Every day | ORAL | 1 refills | Status: DC

## 2022-07-04 NOTE — Progress Notes (Signed)
Virtual Visit via Video Note  I connected with Alonna Buckler on 07/04/22 at  9:30 AM EST by a video enabled telemedicine application and verified that I am speaking with the correct person using two identifiers.  Location: Patient: College Provider: Office   I discussed the limitations of evaluation and management by telemedicine and the availability of in person appointments. The patient expressed understanding and agreed to proceed.  I discussed the assessment and treatment plan with the patient. The patient was provided an opportunity to ask questions and all were answered. The patient agreed with the plan and demonstrated an understanding of the instructions.   The patient was advised to call back or seek an in-person evaluation if the symptoms worsen or if the condition fails to improve as anticipated.  [name], MD     [facility] MD/PA/NP OP Progress Note  07/04/2022 9:43 AM Alonna Buckler  MRN:  [id number]  Chief Complaint:  "I am doing okay..."   HPI: This is a 26 year old Caucasian female with hx of generalized anxiety disorder, MDD, panic attacks previously on Zoloft and BuSpar since March 2019 and also tried on Wellbutrin XL which caused more irritability and therefore discontinued and currently prescribed Latuda 40 mg once a day and Lamictal 25 mg BID for concerns regarding bipolar disorder and BuSpar for anxiety.   Talina was seen and evaluated over telemedicine encounter for medication management follow-up.  She was present by herself and was evaluated alone.  She denies any new concerns for today's appointment.  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  She does state that there is some anxiety related to starting a new job but denies excessive worries or overwhelming anxiety.  She also says that her mood has been good, denies any high highs or low lows.   She states that she gets tired because of the work and therefore sleeping very well.  Her energy during the day could fluctuate.  She is still working out but trying to get into the better routine of it with work.  She states that she has been compliant with her medications and denies any side effects from them.  She denies any new psychosocial stressors.  She denies any substance use except drinking 1 or 2 drinks socially.  We discussed to continue with current medications because of the stability in her symptoms.  She also continues to see her therapist regularly.  She will follow back again in 2 months or earlier if needed.   Visit Diagnosis:    ICD-10-CM   1. Generalized anxiety disorder with panic attacks  F41.1 busPIRone (BUSPAR) 10 MG tablet   F41.0     2. Bipolar disorder, in partial remission, most recent episode depressed (HCC)  F31.75 lamoTRIgine (LAMICTAL) 25 MG tablet         Past Psychiatric History: reviewed today, no change.   Past Medical History:  Past Medical History:  Diagnosis Date   Anxiety     Past Surgical History:  Procedure Laterality Date   MOUTH SURGERY      Family Psychiatric History: As mentioned in initial H&P, reviewed today, no change  Family History:  Family History  Problem Relation Age of Onset   Anxiety disorder Mother    Depression Mother     Social History:  Social History   Socioeconomic History  Marital status: Single    Spouse name: Not on file   Number of children: 0   Years of education: Not on file   Highest education level: Some college, no degree  Occupational History   Not on file  Tobacco Use   Smoking status: Never   Smokeless tobacco: Never  Vaping Use   Vaping Use: Never used  Substance and Sexual Activity   Alcohol use: Yes    Alcohol/week: 3.0 - 5.0 standard drinks of alcohol    Types: 3 - 4 Shots of liquor per week   Drug use: Not Currently   Sexual activity: Not Currently  Other Topics Concern   Not on  file  Social History Narrative   Not on file   Social Determinants of Health   Financial Resource Strain: Low Risk  (06/23/2018)   Overall Financial Resource Strain (CARDIA)    Difficulty of Paying Living Expenses: Not hard at all  Food Insecurity: No Food Insecurity (06/23/2018)   Hunger Vital Sign    Worried About Running Out of Food in the Last Year: Never true    Ran Out of Food in the Last Year: Never true  Transportation Needs: No Transportation Needs (06/23/2018)   PRAPARE - Administrator, Civil Service (Medical): No    Lack of Transportation (Non-Medical): No  Physical Activity: Sufficiently Active (06/23/2018)   Exercise Vital Sign    Days of Exercise per Week: 5 days    Minutes of Exercise per Session: 50 min  Stress: Not on file  Social Connections: Unknown (06/23/2018)   Social Connection and Isolation Panel [NHANES]    Frequency of Communication with Friends and Family: Not on file    Frequency of Social Gatherings with Friends and Family: Not on file    Attends Religious Services: More than 4 times per year    Active Member of [employer] or Organizations: Yes    Attends Banker Meetings: More than 4 times per year    Marital Status: Never married    Allergies:  Allergies  Allergen Reactions   No Known Allergies     Metabolic Disorder Labs: No results found for: "HGBA1C", "MPG" Lab Results  Component Value Date   PROLACTIN 13.5 02/01/2020   No results found for: "CHOL", "TRIG", "HDL", "CHOLHDL", "VLDL", "LDLCALC" Lab Results  Component Value Date   TSH 3.700 04/23/2021   TSH 0.550 01/24/2021    Therapeutic Level Labs: No results found for: "LITHIUM" No results found for: "VALPROATE" No results found for: "CBMZ"  Current Medications: Current Outpatient Medications  Medication Sig Dispense Refill   busPIRone (BUSPAR) 10 MG tablet Take 2 tablets (20 mg total) by mouth 2 (two) times daily. 120 tablet 1   calcitRIOL (ROCALTROL)  0.25 MCG capsule TAKE 1 CAPSULE BY MOUTH TWICE A DAY IN THE MORNING AND IN THE EVENING 60 capsule 3   Calcium Carbonate-Vitamin D 600-400 MG-UNIT tablet Take by mouth.     fluticasone (FLONASE) 50 MCG/ACT nasal spray Place into the nose.     Lactobacillus (AZO COMPLETE FEMININE BALANCE PO) AZO Complete Feminine Balance     lamoTRIgine (LAMICTAL) 25 MG tablet Take 1 tablet (25 mg total) by mouth 2 (two) times daily. 60 tablet 1   levocetirizine (XYZAL) 5 MG tablet Take 5 mg by mouth every evening.     levothyroxine (SYNTHROID) 50 MCG tablet Take 1 tablet (50 mcg total) by mouth daily. 90 tablet 2   lurasidone (LATUDA) 40 MG TABS  tablet Take 1 tablet (40 mg total) by mouth daily with supper. 30 tablet 1   meloxicam (MOBIC) 15 MG tablet Take 1 tablet (15 mg total) by mouth daily. 30 tablet 1   meloxicam (MOBIC) 15 MG tablet Take 1 tablet by mouth daily.     norgestimate-ethinyl estradiol (ORTHO-CYCLEN) 0.25-35 MG-MCG tablet Take 1 tablet by mouth daily.     promethazine (PHENERGAN) 25 MG tablet Take 1 tablet (25 mg total) by mouth every 6 (six) hours as needed for nausea or vomiting. 8 tablet 0   traZODone (DESYREL) 100 MG tablet Take 1 tablet (100 mg total) by mouth at bedtime. 30 tablet 1   Current Facility-Administered Medications  Medication Dose Route Frequency Provider Last Rate Last Admin   betamethasone acetate-betamethasone sodium phosphate (CELESTONE) injection 3 mg  3 mg Intra-articular Once Felecia ShellingEvans, Brent M, DPM         Musculoskeletal: Strength & Muscle Tone: unable to assess since visit was over the telemedicine. Gait & Station: unable to assess since visit was over the telemedicine. Patient leans: N/A  Psychiatric Specialty Exam: ROSReview of 12 systems negative except as mentioned in HPI   There were no vitals taken for this visit.There is no height or weight on file to calculate BMI.  General Appearance: Casual and Fairly Groomed  Eye Contact:  Good  Speech:  Clear and  Coherent and Normal Rate  Volume:  Normal  Mood:  "okay..."  Affect:  Appropriate, Congruent, and Restricted  Thought Process:  Goal Directed and Linear  Orientation:  Full (Time, Place, and Person)  Thought Content: Logical   Suicidal Thoughts:  No  Homicidal Thoughts:  No  Memory:  Immediate;   Fair Recent;   Fair Remote;   Fair  Judgement:  Fair  Insight:  Fair  Psychomotor Activity:  Normal  Concentration:  Concentration: Good and Attention Span: Good  Recall:  Good  Fund of Knowledge: Good  Language: Good  Akathisia:  No    AIMS (if indicated): not done  Assets:  Communication Skills Desire for Improvement Financial Resources/Insurance Housing Leisure Time Physical Health Social Support Talents/Skills Transportation Vocational/Educational  ADL's:  Intact  Cognition: WNL  Sleep:   Good   Screenings: Flowsheet Row ED from 08/14/2021 in [facility]  C-SSRS RISK CATEGORY No Risk        Assessment and Plan:   26 yo F with hx of anxiety disorder on Zoloft since March of 2019 and was previously in [facility] at [facility] [employer].  She is diagnosed with generalized anxiety disorder with panic attacks and has responded well to Zoloft 100 mg once a day and BuSpar 10 mg 2 times a day. However recently has noticed more mood fluctuation, irritability, agitation, depressed mood, sleep changes which initially was thought to be consistent with MDD. However Zoloft and Wellbutrin both worsened her symptoms, and she continued to experience mood lability, irritability, agitation, depressed mood, sleep changes, recently noted self applying to 100 jobs and there is a strong +ve family of bipolar disorder and therefore considering the dx of Bipolar Disorder.  She was started on Latuda as pt's mother and brother has responded well to it.   At her appointment in 04/2021, she reported worsening of mood lability, anxiety and  sleep in the context of  psychosocial stressors(not getting a job yet, overthinking about her future and relationships). She reported that she was more tearful, sad, has lack of motivation attending school,  problems with sleep and energy, which appeared to be most likely in the context of her ongoing psychosocial stressors.   Update on 07/04/22 -she appears to have continued stability with her mood and anxiety on current medications and therapy.  Recently started working, working full-time, and this seems to have alleviated some stress.   Plan as mentioned below.      Plan: Problem 1: Mood(chronic and stable) - Continue with Lamictal 25 mg BID - Continue Latuda 40 mg daily with dinner  - Risks and benefits discussed.  - Ind therapy with Shara Blazing at [facility].  Problem 2: Generalized Anxiety with Panic attack(chronic andstable) Plan:           - Continue with Buspar 20 mg BID          - Follow up in 6-8 weeks or early if needed. .   Problem 3: Sleep Plan:  - Continue with Trazodone 100  mg QHS for sleep can cut down to 50 mg QHS if she continues to sleep well and can make it as needed.         MDM = 2 or more chronic stable conditions + med management        [name], MD 07/04/2022, 9:43 AM

## 2022-07-23 ENCOUNTER — Telehealth: Payer: Self-pay

## 2022-07-23 NOTE — Telephone Encounter (Signed)
Patient called in states she is experiencing shaking and has not had this happen in a long time. Would like to know if you this she should go back to taking Calcitrol twice a day. Please advise.

## 2022-07-24 ENCOUNTER — Encounter: Payer: Self-pay | Admitting: Endocrinology

## 2022-07-24 ENCOUNTER — Other Ambulatory Visit: Payer: Self-pay | Admitting: Endocrinology

## 2022-07-24 DIAGNOSIS — E201 Pseudohypoparathyroidism: Secondary | ICD-10-CM

## 2022-07-24 NOTE — Telephone Encounter (Signed)
Spoke with patient she is aware no fasting

## 2022-07-25 LAB — CALCIUM: Calcium: 9.2 mg/dL (ref 8.7–10.2)

## 2022-08-07 ENCOUNTER — Other Ambulatory Visit: Payer: Self-pay | Admitting: Endocrinology

## 2022-09-05 ENCOUNTER — Telehealth (INDEPENDENT_AMBULATORY_CARE_PROVIDER_SITE_OTHER): Payer: BC Managed Care – PPO | Admitting: Child and Adolescent Psychiatry

## 2022-09-05 DIAGNOSIS — F410 Panic disorder [episodic paroxysmal anxiety] without agoraphobia: Secondary | ICD-10-CM

## 2022-09-05 DIAGNOSIS — F411 Generalized anxiety disorder: Secondary | ICD-10-CM

## 2022-09-05 DIAGNOSIS — F3175 Bipolar disorder, in partial remission, most recent episode depressed: Secondary | ICD-10-CM | POA: Diagnosis not present

## 2022-09-05 MED ORDER — LAMOTRIGINE 25 MG PO TABS: 25.0000 mg | ORAL_TABLET | Freq: Two times a day (BID) | ORAL | 1 refills | Status: DC

## 2022-09-05 MED ORDER — LURASIDONE HCL 40 MG PO TABS: 40.0000 mg | ORAL_TABLET | Freq: Every day | ORAL | 1 refills | Status: DC

## 2022-09-05 MED ORDER — BUSPIRONE HCL 10 MG PO TABS: 20.0000 mg | ORAL_TABLET | Freq: Two times a day (BID) | ORAL | 1 refills | Status: DC

## 2022-09-05 MED ORDER — TRAZODONE HCL 100 MG PO TABS: 100.0000 mg | ORAL_TABLET | Freq: Every day | ORAL | 1 refills | Status: DC

## 2022-09-05 NOTE — Progress Notes (Signed)
Virtual Visit via Video Note  I connected with Colleen Tapia on 09/05/22 at 10:00 AM EST by a video enabled telemedicine application and verified that I am speaking with the correct person using two identifiers.  Location: Patient: College Provider: Office   I discussed the limitations of evaluation and management by telemedicine and the availability of in person appointments. The patient expressed understanding and agreed to proceed.  I discussed the assessment and treatment plan with the patient. The patient was provided an opportunity to ask questions and all were answered. The patient agreed with the plan and demonstrated an understanding of the instructions.   The patient was advised to call back or seek an in-person evaluation if the symptoms worsen or if the condition fails to improve as anticipated.  Orlene Erm, MD     [facility] MD/PA/NP OP Progress Note  09/05/2022 10:15 AM Colleen Tapia  MRN:  [id number]  Chief Complaint:  "I am doing okay..."  HPI: This is a 27 year old Caucasian female with hx of generalized anxiety disorder, MDD, panic attacks previously on Zoloft and BuSpar since March 2019 and also tried on Wellbutrin XL which caused more irritability and therefore discontinued and currently prescribed Latuda 40 mg once a day and Lamictal 25 mg BID for concerns regarding bipolar disorder and BuSpar for anxiety.   Colleen Tapia was seen and evaluated over telemedicine encounter for medication management follow-up.  She was present by herself and was evaluated alone.  She denies any new concerns for today's appointment.  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  What can be stressful sometimes but overall she denies excessive worries or anxiety.  Also denies any high highs or lows and reports that mood can fluctuate depending on the situation but overall stable.  She says that she is sleeping well with trazodone, sleep is restful.  She says  that she has been consistently taking her medications without any problems.  Continues to attend gym, denies any excessive weight gain or loss.  We discussed to continue with current medications and follow back again in about 2 months or earlier if needed.  She verbalized understanding.  She continues to see her therapist regularly about every 2 weeks.    Visit Diagnosis:    ICD-10-CM   1. Generalized anxiety disorder with panic attacks  F41.1 busPIRone (BUSPAR) 10 MG tablet   F41.0     2. Bipolar disorder, in partial remission, most recent episode depressed (HCC)  F31.75 lamoTRIgine (LAMICTAL) 25 MG tablet         Past Psychiatric History: reviewed today, no change.   Past Medical History:  Past Medical History:  Diagnosis Date   Anxiety     Past Surgical History:  Procedure Laterality Date   MOUTH SURGERY      Family Psychiatric History: As mentioned in initial H&P, reviewed today, no change  Family History:  Family History  Problem Relation Age of Onset   Anxiety disorder Mother    Depression Mother     Social History:  Social History   Socioeconomic History   Marital status: Single    Spouse name: Not on file   Number of children: 0   Years of education: Not on file   Highest education level: Some college, no degree  Occupational History   Not on file  Tobacco Use   Smoking status: Never   Smokeless tobacco: Never  Vaping Use   Vaping Use: Never  used  Substance and Sexual Activity   Alcohol use: Yes    Alcohol/week: 3.0 - 5.0 standard drinks of alcohol    Types: 3 - 4 Shots of liquor per week   Drug use: Not Currently   Sexual activity: Not Currently  Other Topics Concern   Not on file  Social History Narrative   Not on file   Social Determinants of Health   Financial Resource Strain: Low Risk  (06/23/2018)   Overall Financial Resource Strain (CARDIA)    Difficulty of Paying Living Expenses: Not hard at all  Food Insecurity: No Food Insecurity  (06/23/2018)   Hunger Vital Sign    Worried About Running Out of Food in the Last Year: Never true    Ran Out of Food in the Last Year: Never true  Transportation Needs: No Transportation Needs (06/23/2018)   PRAPARE - Hydrologist (Medical): No    Lack of Transportation (Non-Medical): No  Physical Activity: Sufficiently Active (06/23/2018)   Exercise Vital Sign    Days of Exercise per Week: 5 days    Minutes of Exercise per Session: 50 min  Stress: Not on file  Social Connections: Unknown (06/23/2018)   Social Connection and Isolation Panel [NHANES]    Frequency of Communication with Friends and Family: Not on file    Frequency of Social Gatherings with Friends and Family: Not on file    Attends Religious Services: More than 4 times per year    Active Member of [employer] or Organizations: Yes    Attends Archivist Meetings: More than 4 times per year    Marital Status: Never married    Allergies:  Allergies  Allergen Reactions   No Known Allergies     Metabolic Disorder Labs: No results found for: "HGBA1C", "MPG" Lab Results  Component Value Date   PROLACTIN 13.5 02/01/2020   No results found for: "CHOL", "TRIG", "HDL", "CHOLHDL", "VLDL", "LDLCALC" Lab Results  Component Value Date   TSH 3.700 04/23/2021   TSH 0.550 01/24/2021    Therapeutic Level Labs: No results found for: "LITHIUM" No results found for: "VALPROATE" No results found for: "CBMZ"  Current Medications: Current Outpatient Medications  Medication Sig Dispense Refill   busPIRone (BUSPAR) 10 MG tablet Take 2 tablets (20 mg total) by mouth 2 (two) times daily. 120 tablet 1   calcitRIOL (ROCALTROL) 0.25 MCG capsule TAKE ONE CAPSULE BY MOUTH EVERY MORNING AND TAKE ONE CAPSULE BY MOUTH EVERY EVENING 60 capsule 3   Calcium Carbonate-Vitamin D 600-400 MG-UNIT tablet Take by mouth.     fluticasone (FLONASE) 50 MCG/ACT nasal spray Place into the nose.     Lactobacillus (AZO  COMPLETE FEMININE BALANCE PO) AZO Complete Feminine Balance     lamoTRIgine (LAMICTAL) 25 MG tablet Take 1 tablet (25 mg total) by mouth 2 (two) times daily. 60 tablet 1   levocetirizine (XYZAL) 5 MG tablet Take 5 mg by mouth every evening.     levothyroxine (SYNTHROID) 50 MCG tablet Take 1 tablet (50 mcg total) by mouth daily. 90 tablet 2   lurasidone (LATUDA) 40 MG TABS tablet Take 1 tablet (40 mg total) by mouth daily with supper. 30 tablet 1   meloxicam (MOBIC) 15 MG tablet Take 1 tablet (15 mg total) by mouth daily. 30 tablet 1   meloxicam (MOBIC) 15 MG tablet Take 1 tablet by mouth daily.     norgestimate-ethinyl estradiol (ORTHO-CYCLEN) 0.25-35 MG-MCG tablet Take 1 tablet by mouth daily.  promethazine (PHENERGAN) 25 MG tablet Take 1 tablet (25 mg total) by mouth every 6 (six) hours as needed for nausea or vomiting. 8 tablet 0   traZODone (DESYREL) 100 MG tablet Take 1 tablet (100 mg total) by mouth at bedtime. 30 tablet 1   Current Facility-Administered Medications  Medication Dose Route Frequency Provider Last Rate Last Admin   betamethasone acetate-betamethasone sodium phosphate (CELESTONE) injection 3 mg  3 mg Intra-articular Once Edrick Kins, DPM         Musculoskeletal: Strength & Muscle Tone: unable to assess since visit was over the telemedicine. Gait & Station: unable to assess since visit was over the telemedicine. Patient leans: N/A  Psychiatric Specialty Exam: ROSReview of 12 systems negative except as mentioned in HPI   There were no vitals taken for this visit.There is no height or weight on file to calculate BMI.  General Appearance: Casual and Fairly Groomed  Eye Contact:  Good  Speech:  Clear and Coherent and Normal Rate  Volume:  Normal  Mood:  "okay"  Affect:  Appropriate, Congruent, and Restricted  Thought Process:  Goal Directed and Linear  Orientation:  Full (Time, Place, and Person)  Thought Content: Logical   Suicidal Thoughts:  No  Homicidal  Thoughts:  No  Memory:  Immediate;   Fair Recent;   Fair Remote;   Fair  Judgement:  Fair  Insight:  Fair  Psychomotor Activity:  Normal  Concentration:  Concentration: Good and Attention Span: Good  Recall:  Good  Fund of Knowledge: Good  Language: Good  Akathisia:  No    AIMS (if indicated): not done  Assets:  Communication Skills Desire for Improvement Financial Resources/Insurance Housing Leisure Time Physical Health Social Support Talents/Skills Transportation Vocational/Educational  ADL's:  Intact  Cognition: WNL  Sleep:   Good   Screenings: [place] ED from 08/14/2021 in [facility] Department at [place] No Risk        Assessment and Plan:   27 yo F with hx of anxiety disorder on Zoloft since March of 2019 and was previously in counseling at [facility].  She is diagnosed with generalized anxiety disorder with panic attacks and has responded well to Zoloft 100 mg once a day and BuSpar 10 mg 2 times a day. However recently has noticed more mood fluctuation, irritability, agitation, depressed mood, sleep changes which initially was thought to be consistent with MDD. However Zoloft and Wellbutrin both worsened her symptoms, and she continued to experience mood lability, irritability, agitation, depressed mood, sleep changes, recently noted self applying to 100 jobs and there is a strong +ve family of bipolar disorder and therefore considering the dx of Bipolar Disorder.  She was started on Latuda as pt's mother and brother has responded well to it.   At her appointment in 04/2021, she reported worsening of mood lability, anxiety and sleep in the context of  psychosocial stressors(not getting a job yet, overthinking about her future and relationships). She reported that she was more tearful, sad, has lack of motivation attending school, problems with sleep and energy, which appeared to be most  likely in the context of her ongoing psychosocial stressors.   Update on 09/05/22 -she appears to have continued stability with her mood, anxiety on current medications and therapy.  Has adjusted well with her work.recommending to continue with current treatment as mentioned below in the chart.    Plan as mentioned below.  Plan: Problem 1: Mood(chronic and stable) - Continue with Lamictal 25 mg BID - Continue Latuda 40 mg daily with dinner  - Risks and benefits discussed at the initiation.  - Ind therapy with Gearldine Bienenstock at [place].  Problem 2: Generalized Anxiety with Panic attack(chronic andstable) Plan:           - Continue with Buspar 20 mg BID          - Follow up in 6-8 weeks or early if needed. .   Problem 3: Sleep Plan:  - Continue with Trazodone 100  mg QHS for sleep can cut down to 50 mg QHS if she continues to sleep well and can make it as needed.         MDM = 2 or more chronic stable conditions + med management        Orlene Erm, MD 09/05/2022, 10:15 AM

## 2022-09-29 ENCOUNTER — Telehealth: Payer: Self-pay

## 2022-09-29 NOTE — Telephone Encounter (Signed)
Pt wanted to make sure she did not need to fast for her upcoming lab appointment.

## 2022-09-30 NOTE — Telephone Encounter (Signed)
Pt advised via Mychart message.

## 2022-10-02 ENCOUNTER — Other Ambulatory Visit: Payer: Self-pay

## 2022-10-02 DIAGNOSIS — E201 Pseudohypoparathyroidism: Secondary | ICD-10-CM

## 2022-10-02 DIAGNOSIS — E038 Other specified hypothyroidism: Secondary | ICD-10-CM

## 2022-10-03 LAB — BASIC METABOLIC PANEL
BUN/Creatinine Ratio: 13 (ref 9–23)
BUN: 11 mg/dL (ref 6–20)
CO2: 24 mmol/L (ref 20–29)
Calcium: 9.6 mg/dL (ref 8.7–10.2)
Chloride: 99 mmol/L (ref 96–106)
Creatinine, Ser: 0.84 mg/dL (ref 0.57–1.00)
Glucose: 79 mg/dL (ref 70–99)
Potassium: 4.9 mmol/L (ref 3.5–5.2)
Sodium: 137 mmol/L (ref 134–144)
eGFR: 99 mL/min/{1.73_m2} (ref >59)

## 2022-10-03 LAB — TSH: TSH: 2.81 u[IU]/mL (ref 0.450–4.500)

## 2022-10-03 LAB — T4, FREE: Free T4: 1.19 ng/dL (ref 0.82–1.77)

## 2022-10-07 ENCOUNTER — Ambulatory Visit: Payer: BC Managed Care – PPO | Admitting: Endocrinology

## 2022-10-07 ENCOUNTER — Encounter: Payer: Self-pay | Admitting: Endocrinology

## 2022-10-07 VITALS — BP 116/82 | HR 75 | Ht 65.0 in | Wt 204.6 lb

## 2022-10-07 DIAGNOSIS — E038 Other specified hypothyroidism: Secondary | ICD-10-CM | POA: Diagnosis not present

## 2022-10-07 DIAGNOSIS — E201 Pseudohypoparathyroidism: Secondary | ICD-10-CM | POA: Diagnosis not present

## 2022-10-07 NOTE — Patient Instructions (Signed)
Calcium 2x daily  Calcium 2x daily

## 2022-10-07 NOTE — Progress Notes (Unsigned)
Patient ID: Colleen Tapia, female   DOB: 1995-10-18, 27 y.o.   MRN: [id number]             Chief complaint: Endocrinology follow-up  History of Present Illness:  Pseudohypoparathyroidism   Background history: She went for routine visit with her PCP and was found to have a calcium was 6.9 in June 2021 She thinks that her calcium probably had been low previously also Ionized calcium was also low at 3.7, normal >4.5 PTH level 384  RECENT HISTORY:  She has had symptomatic calcium at the time of diagnosis and has been treated with calcitriol and calcium  Her pretreatment symptoms were tightness and cramping in her fingers, at times also a tightness in her face making it difficult to talk She was also complaining of feeling tired and having headaches   Calcium levels have been consistently normal with taking CALCITRIOL 0.25 mcg With her calcium being high normal with this regimen she is not taking 1 capsule alternating with 2 She takes her prescriptions with the help of pillbox regularly Has been continued on calcium supplements, using Os-Cal in the evening only, she is taking 1200 mg  No recent symptoms of muscle twitching and no cramping in muscles Calcium is consistently normal around 9 but relatively higher at 9.6 compared to December  Lab Results  Component Value Date   CALCIUM 9.6 10/02/2022   CALCIUM 9.2 07/24/2022   CALCIUM 9.5 04/01/2022   CALCIUM 9.2 09/24/2021   CALCIUM 8.2 (L) 08/14/2021   CALCIUM 9.0 04/23/2021   Lab Results  Component Value Date   CALCIUM 9.6 10/02/2022   PHOS 4.2 07/13/2020    Other problems addressed today: See review of systems   Past Medical History:  Diagnosis Date   Anxiety     Past Surgical History:  Procedure Laterality Date   MOUTH SURGERY      Family History  Problem Relation Age of Onset   Anxiety disorder Mother    Depression Mother     Social History:  reports that she has never smoked. She has never used  smokeless tobacco. She reports current alcohol use of about 3.0 - 5.0 standard drinks of alcohol per week. She reports that she does not currently use drugs.  Allergies:  Allergies  Allergen Reactions   No Known Allergies     Allergies as of 10/07/2022       Reactions   No Known Allergies         Medication List        Accurate as of October 07, 2022 10:20 AM. If you have any questions, ask your nurse or doctor.          AZO COMPLETE FEMININE BALANCE PO AZO Complete Feminine Balance   busPIRone 10 MG tablet Commonly known as: BUSPAR Take 2 tablets (20 mg total) by mouth 2 (two) times daily.   calcitRIOL 0.25 MCG capsule Commonly known as: ROCALTROL TAKE ONE CAPSULE BY MOUTH EVERY MORNING AND TAKE ONE CAPSULE BY MOUTH EVERY EVENING   Calcium Carbonate-Vitamin D 600-400 MG-UNIT tablet Take by mouth.   fluticasone 50 MCG/ACT nasal spray Commonly known as: FLONASE Place into the nose.   lamoTRIgine 25 MG tablet Commonly known as: LAMICTAL Take 1 tablet (25 mg total) by mouth 2 (two) times daily.   levocetirizine 5 MG tablet Commonly known as: XYZAL Take 5 mg by mouth every evening.   levothyroxine 50 MCG tablet Commonly known as: SYNTHROID Take 1 tablet (50 mcg total)  by mouth daily.   lurasidone 40 MG Tabs tablet Commonly known as: LATUDA Take 1 tablet (40 mg total) by mouth daily with supper.   meloxicam 15 MG tablet Commonly known as: MOBIC Take 1 tablet (15 mg total) by mouth daily.   meloxicam 15 MG tablet Commonly known as: MOBIC Take 1 tablet by mouth daily.   norgestimate-ethinyl estradiol 0.25-35 MG-MCG tablet Commonly known as: ORTHO-CYCLEN Take 1 tablet by mouth daily.   promethazine 25 MG tablet Commonly known as: PHENERGAN Take 1 tablet (25 mg total) by mouth every 6 (six) hours as needed for nausea or vomiting.   traZODone 100 MG tablet Commonly known as: DESYREL Take 1 tablet (100 mg total) by mouth at bedtime.            Review of Systems  HYPOTHYROIDISM:   She had been complaining of fatigue and weight gain but no cold intolerance at her initial visit  Had a slightly low baseline free T4 level of 0.59 With levothyroxine supplements her fatigue improved  Her levothyroxine dose was decreased to 50 mcg in 7/22 when she had a high normal free T4 and symptoms of heat intolerance  Overall she feels fairly good and does not complain of fatigue  She takes her levothyroxine before breakfast consistently  Thyroid levels as follows  Lab Results  Component Value Date   TSH 2.810 10/02/2022   TSH 3.700 04/23/2021   TSH 0.550 01/24/2021   FREET4 1.19 10/02/2022   FREET4 1.23 04/01/2022   FREET4 1.09 09/24/2021    Weight history:  Wt Readings from Last 3 Encounters:  10/07/22 204 lb 9.6 oz (92.8 kg)  04/03/22 199 lb 3.2 oz (90.4 kg)  09/26/21 228 lb 9.6 oz (103.7 kg)   OLIGOMENORRHEA: This has been treated with birth control pills, no pretreatment estradiol levels available  Prolactin level was normal   BP Readings from Last 3 Encounters:  10/07/22 116/82  04/03/22 108/72  09/26/21 124/84     PHYSICAL EXAM:  BP 116/82 (BP Location: Left Arm, Patient Position: Sitting, Cuff Size: Normal)   Pulse 75   Ht '5\' 5"'$  (1.651 m)   Wt 204 lb 9.6 oz (92.8 kg)   SpO2 99%   BMI 34.05 kg/m     ASSESSMENT:   PSEUDOHYPOPARATHYROIDISM, familial  She had mild baseline symptoms of tightness in her hands and face and cramping with significantly long baseline calcium of 6.9 Symptoms have been consistently controlled with calcitriol and calcium supplements  She is taking 0.25 mcg 2 alternating with 1 daily with calcium supplement once a day in the evening Calcium is again consistently normal, but still up and normal at 9.6  HYPOTHYROIDISM: She has secondary hypothyroidism with mildly low free T4 level at baseline Subjectively doing well, free T4 is again normal Renal function and potassium  normal    PLAN:   She will reduce calcitriol to only 0.25 mcg but she will increase calcium to twice a day Call if having any muscle twitching or tingling  No change in levothyroxine dose of 50 mcg daily  Follow-up in 5-6 months     Faye Sanfilippo 10/07/2022, 10:20 AM

## 2022-10-17 ENCOUNTER — Other Ambulatory Visit: Payer: Self-pay | Admitting: Endocrinology

## 2022-10-17 DIAGNOSIS — E201 Pseudohypoparathyroidism: Secondary | ICD-10-CM

## 2022-11-07 ENCOUNTER — Telehealth (INDEPENDENT_AMBULATORY_CARE_PROVIDER_SITE_OTHER): Payer: BC Managed Care – PPO | Admitting: Child and Adolescent Psychiatry

## 2022-11-07 DIAGNOSIS — F410 Panic disorder [episodic paroxysmal anxiety] without agoraphobia: Secondary | ICD-10-CM

## 2022-11-07 DIAGNOSIS — F411 Generalized anxiety disorder: Secondary | ICD-10-CM

## 2022-11-07 DIAGNOSIS — F3175 Bipolar disorder, in partial remission, most recent episode depressed: Secondary | ICD-10-CM | POA: Diagnosis not present

## 2022-11-07 MED ORDER — TRAZODONE HCL 100 MG PO TABS: 100.0000 mg | ORAL_TABLET | Freq: Every day | ORAL | 2 refills | Status: DC

## 2022-11-07 MED ORDER — LURASIDONE HCL 40 MG PO TABS: 40.0000 mg | ORAL_TABLET | Freq: Every day | ORAL | 2 refills | Status: DC

## 2022-11-07 MED ORDER — LAMOTRIGINE 25 MG PO TABS: 25.0000 mg | ORAL_TABLET | Freq: Two times a day (BID) | ORAL | 2 refills | Status: DC

## 2022-11-07 MED ORDER — BUSPIRONE HCL 10 MG PO TABS: 20.0000 mg | ORAL_TABLET | Freq: Two times a day (BID) | ORAL | 2 refills | Status: DC

## 2022-11-07 NOTE — Progress Notes (Signed)
Virtual Visit via Video Note  I connected with Colleen Tapia on 11/07/22 at 10:00 AM EDT by a video enabled telemedicine application and verified that I am speaking with the correct person using two identifiers.  Location: Patient: College Provider: Office   I discussed the limitations of evaluation and management by telemedicine and the availability of in person appointments. The patient expressed understanding and agreed to proceed.  I discussed the assessment and treatment plan with the patient. The patient was provided an opportunity to ask questions and all were answered. The patient agreed with the plan and demonstrated an understanding of the instructions.   The patient was advised to call back or seek an in-person evaluation if the symptoms worsen or if the condition fails to improve as anticipated.  Colleen Smalling, MD     [facility] MD/PA/NP OP Progress Note  11/07/2022 10:21 AM Colleen Tapia  MRN:  [id number]  Chief Complaint:  "I am doing okay.."  HPI: This is a 27 year old Caucasian female with hx of generalized anxiety disorder, MDD, panic attacks previously on Zoloft and BuSpar since March 2019 and also tried on Wellbutrin XL which caused more irritability and therefore discontinued and currently prescribed Latuda 40 mg once a day and Lamictal 25 mg BID for concerns regarding bipolar disorder and BuSpar for anxiety.   Colleen Tapia was seen and evaluated over telemedicine encounter for medication management follow-up.  She was present by herself and was evaluated alone.  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  She works full time, says that work can be stressful at times but she has been able to manage her stress and anxiety well enough.  She denies any high highs or low lows, and reports that overall she has been doing well in regards of her mood.  She says that she has been sleeping well, takes  trazodone 100 mg at night, we discussed that if she is sleeping well then she can try to decrease the dose to 50 mg at night and cannot decide to do it as needed if she still sleeps well on trazodone 50 mg.  She has been consistently taking her medications without any problems and continues to see her therapist about every 2 weeks.   Visit Diagnosis:    ICD-10-CM   1. Bipolar disorder, in partial remission, most recent episode depressed  F31.75 lamoTRIgine (LAMICTAL) 25 MG tablet    2. Generalized anxiety disorder with panic attacks  F41.1 busPIRone (BUSPAR) 10 MG tablet   F41.0           Past Psychiatric History: reviewed today, no change.   Past Medical History:  Past Medical History:  Diagnosis Date   Anxiety     Past Surgical History:  Procedure Laterality Date   MOUTH SURGERY      Family Psychiatric History: As mentioned in initial H&P, reviewed today, no change  Family History:  Family History  Problem Relation Age of Onset   Anxiety disorder Mother    Depression Mother     Social History:  Social History   Socioeconomic History   Marital status: Single    Spouse name: Not on file   Number of children: 0   Years of education: Not on file   Highest education level: Some college, no degree  Occupational History   Not on file  Tobacco Use   Smoking status: Never   Smokeless tobacco:  Never  Vaping Use   Vaping Use: Never used  Substance and Sexual Activity   Alcohol use: Yes    Alcohol/week: 3.0 - 5.0 standard drinks of alcohol    Types: 3 - 4 Shots of liquor per week   Drug use: Not Currently   Sexual activity: Not Currently  Other Topics Concern   Not on file  Social History Narrative   Not on file   Social Determinants of Health   Financial Resource Strain: Low Risk  (06/23/2018)   Overall Financial Resource Strain (CARDIA)    Difficulty of Paying Living Expenses: Not hard at all  Food Insecurity: No Food Insecurity (06/23/2018)   Hunger Vital  Sign    Worried About Running Out of Food in the Last Year: Never true    Ran Out of Food in the Last Year: Never true  Transportation Needs: No Transportation Needs (06/23/2018)   PRAPARE - Administrator, Civil Service (Medical): No    Lack of Transportation (Non-Medical): No  Physical Activity: Sufficiently Active (06/23/2018)   Exercise Vital Sign    Days of Exercise per Week: 5 days    Minutes of Exercise per Session: 50 min  Stress: Not on file  Social Connections: Unknown (06/23/2018)   Social Connection and Isolation Panel [NHANES]    Frequency of Communication with Friends and Family: Not on file    Frequency of Social Gatherings with Friends and Family: Not on file    Attends Religious Services: More than 4 times per year    Active Member of [employer] or Organizations: Yes    Attends Banker Meetings: More than 4 times per year    Marital Status: Never married    Allergies:  Allergies  Allergen Reactions   No Known Allergies     Metabolic Disorder Labs: No results found for: "HGBA1C", "MPG" Lab Results  Component Value Date   PROLACTIN 13.5 02/01/2020   No results found for: "CHOL", "TRIG", "HDL", "CHOLHDL", "VLDL", "LDLCALC" Lab Results  Component Value Date   TSH 2.810 10/02/2022   TSH 3.700 04/23/2021    Therapeutic Level Labs: No results found for: "LITHIUM" No results found for: "VALPROATE" No results found for: "CBMZ"  Current Medications: Current Outpatient Medications  Medication Sig Dispense Refill   busPIRone (BUSPAR) 10 MG tablet Take 2 tablets (20 mg total) by mouth 2 (two) times daily. 120 tablet 2   calcitRIOL (ROCALTROL) 0.25 MCG capsule TAKE ONE CAPSULE BY MOUTH EVERY MORNING AND TAKE ONE CAPSULE BY MOUTH EVERY EVENING 60 capsule 3   Calcium Carb-Cholecalciferol (CALCIUM PLUS VITAMIN D3 PO) Take 1,200 mg by mouth. Plus 25 mcg Vitamin D3     Calcium Carbonate-Vitamin D 600-400 MG-UNIT tablet Take by mouth. (Patient not  taking: Reported on 10/07/2022)     fluticasone (FLONASE) 50 MCG/ACT nasal spray Place into the nose.     Lactobacillus (AZO COMPLETE FEMININE BALANCE PO) AZO Complete Feminine Balance     lamoTRIgine (LAMICTAL) 25 MG tablet Take 1 tablet (25 mg total) by mouth 2 (two) times daily. 60 tablet 2   levocetirizine (XYZAL) 5 MG tablet Take 5 mg by mouth every evening.     levothyroxine (SYNTHROID) 50 MCG tablet TAKE 1 TABLET BY MOUTH DAILY 90 tablet 2   lurasidone (LATUDA) 40 MG TABS tablet Take 1 tablet (40 mg total) by mouth daily with supper. 30 tablet 2   norgestimate-ethinyl estradiol (ORTHO-CYCLEN) 0.25-35 MG-MCG tablet Take 1 tablet by mouth daily.  promethazine (PHENERGAN) 25 MG tablet Take 1 tablet (25 mg total) by mouth every 6 (six) hours as needed for nausea or vomiting. 8 tablet 0   traZODone (DESYREL) 100 MG tablet Take 1 tablet (100 mg total) by mouth at bedtime. 30 tablet 2   Current Facility-Administered Medications  Medication Dose Route Frequency Provider Last Rate Last Admin   betamethasone acetate-betamethasone sodium phosphate (CELESTONE) injection 3 mg  3 mg Intra-articular Once Felecia Shelling, DPM         Musculoskeletal: Strength & Muscle Tone: unable to assess since visit was over the telemedicine. Gait & Station: unable to assess since visit was over the telemedicine. Patient leans: N/A  Psychiatric Specialty Exam: ROSReview of 12 systems negative except as mentioned in HPI   There were no vitals taken for this visit.There is no height or weight on file to calculate BMI.  General Appearance: Casual and Fairly Groomed  Eye Contact:  Good  Speech:  Clear and Coherent and Normal Rate  Volume:  Normal  Mood:  "okay..."  Affect:  Appropriate, Congruent, and Restricted  Thought Process:  Goal Directed and Linear  Orientation:  Full (Time, Place, and Person)  Thought Content: Logical   Suicidal Thoughts:  No  Homicidal Thoughts:  No  Memory:  Immediate;    Fair Recent;   Fair Remote;   Fair  Judgement:  Fair  Insight:  Fair  Psychomotor Activity:  Normal  Concentration:  Concentration: Good and Attention Span: Good  Recall:  Good  Fund of Knowledge: Good  Language: Good  Akathisia:  No    AIMS (if indicated): not done  Assets:  Communication Skills Desire for Improvement Financial Resources/Insurance Housing Leisure Time Physical Health Social Support Talents/Skills Transportation Vocational/Educational  ADL's:  Intact  Cognition: WNL  Sleep:   Good   Screenings: Flowsheet Row ED from 08/14/2021 in [facility]  C-SSRS RISK CATEGORY No Risk        Assessment and Plan:   27 yo F with hx of anxiety disorder on Zoloft since March of 2019 and was previously in [facility] at [facility].  She is diagnosed with generalized anxiety disorder with panic attacks and has responded well to Zoloft 100 mg once a day and BuSpar 10 mg 2 times a day. However recently has noticed more mood fluctuation, irritability, agitation, depressed mood, sleep changes which initially was thought to be consistent with MDD. However Zoloft and Wellbutrin both worsened her symptoms, and she continued to experience mood lability, irritability, agitation, depressed mood, sleep changes, recently noted self applying to 100 jobs and there is a strong +ve family of bipolar disorder and therefore considering the dx of Bipolar Disorder.  She was started on Latuda as pt's mother and brother has responded well to it.   At her appointment in 04/2021, she reported worsening of mood lability, anxiety and sleep in the context of  psychosocial stressors(not getting a job yet, overthinking about her future and relationships). She reported that she was more tearful, sad, has lack of motivation attending school, problems with sleep and energy, which appeared to be most likely in the context of her ongoing  psychosocial stressors.   Update on 11/07/22 -she appears to have continued stability with her mood, and anxiety on current medications and therefore recommending to continue with current medications.  She is also seeing a therapist on a regular basis.   Plan as mentioned below.  Plan: Problem 1: Mood(chronic and stable) - Continue with Lamictal 25 mg BID - Continue Latuda 40 mg daily with dinner  - Risks and benefits discussed at the initiation.  - Ind therapy with Shara Blazing at [facility].  Problem 2: Generalized Anxiety with Panic attack(chronic andstable) Plan:           - Continue with Buspar 20 mg BID          - Follow up in 6-8 weeks or early if needed. .   Problem 3: Sleep Plan:  - Continue with Trazodone 100  mg QHS for sleep can cut down to 50 mg QHS if she continues to sleep well and can make it as needed.         MDM = 2 or more chronic stable conditions + med management        Colleen Smalling, MD 11/07/2022, 10:21 AM

## 2022-11-20 ENCOUNTER — Other Ambulatory Visit: Payer: Self-pay | Admitting: Endocrinology

## 2022-12-13 ENCOUNTER — Other Ambulatory Visit: Payer: Self-pay | Admitting: Child and Adolescent Psychiatry

## 2022-12-13 DIAGNOSIS — F3175 Bipolar disorder, in partial remission, most recent episode depressed: Secondary | ICD-10-CM

## 2023-01-02 ENCOUNTER — Other Ambulatory Visit: Payer: Self-pay | Admitting: Child and Adolescent Psychiatry

## 2023-01-04 ENCOUNTER — Other Ambulatory Visit: Payer: Self-pay | Admitting: Child and Adolescent Psychiatry

## 2023-01-04 DIAGNOSIS — F411 Generalized anxiety disorder: Secondary | ICD-10-CM

## 2023-02-06 ENCOUNTER — Telehealth (INDEPENDENT_AMBULATORY_CARE_PROVIDER_SITE_OTHER): Payer: BC Managed Care – PPO | Admitting: Child and Adolescent Psychiatry

## 2023-02-06 DIAGNOSIS — F411 Generalized anxiety disorder: Secondary | ICD-10-CM | POA: Diagnosis not present

## 2023-02-06 DIAGNOSIS — F410 Panic disorder [episodic paroxysmal anxiety] without agoraphobia: Secondary | ICD-10-CM | POA: Diagnosis not present

## 2023-02-06 MED ORDER — LURASIDONE HCL 40 MG PO TABS: 40.0000 mg | ORAL_TABLET | Freq: Every day | ORAL | 1 refills | Status: DC

## 2023-02-06 NOTE — Progress Notes (Signed)
Virtual Visit via Video Note  I connected with Colleen Tapia on 02/06/23 at 10:00 AM EDT by a video enabled telemedicine application and verified that I am speaking with the correct person using two identifiers.  Location: Patient: College Provider: Office   I discussed the limitations of evaluation and management by telemedicine and the availability of in person appointments. The patient expressed understanding and agreed to proceed.  I discussed the assessment and treatment plan with the patient. The patient was provided an opportunity to ask questions and all were answered. The patient agreed with the plan and demonstrated an understanding of the instructions.   The patient was advised to call back or seek an in-person evaluation if the symptoms worsen or if the condition fails to improve as anticipated.  Colleen Smalling, MD     [facility] MD/PA/NP OP Progress Note  02/06/2023 10:46 AM Colleen Tapia  MRN:  [id number]  Chief Complaint:  "I am doing ok..."  HPI: This is a 27 year old Caucasian female with hx of generalized anxiety disorder, MDD, panic attacks previously on Zoloft and BuSpar since March 2019 and also tried on Wellbutrin XL which caused more irritability and therefore discontinued and currently prescribed Latuda 40 mg once a day and Lamictal 25 mg BID for concerns regarding bipolar disorder and BuSpar for anxiety.   Colleen Tapia was seen and evaluated over telemedicine encounter for medication management follow-up.  She was present by herself and was evaluated alone.  She reports that except for the last 1 week she has been doing well.  She says that last week she had some friend issues which she described as one of the friend moved to [place] without informing her. Provided refelctive and empathic listening, and validated patient's experience.  She also reports that her father got laid off and she now has to change her insurance and that has been stressful.  She reports that her mood  however overall has stayed stable, denies any high highs or low lows, has noticed some more anxiety even prior to last week, continues to remain manageable and we discussed to try BuSpar 10 mg at noon in addition to continuing with her current medications.  She verbalized understanding.  She denies any SI or HI, continues to sleep fairly well, denies any dona, has fair appetite.  She spends her free time watching TV, watch soccer games and tries to go to gym.  We discussed to have another follow-up in 3 months or earlier if needed.  She continues to see her therapist about every 1 to 2 weeks.   Visit Diagnosis:    ICD-10-CM   1. Generalized anxiety disorder with panic attacks  F41.1    F41.0            Past Psychiatric History: reviewed today, no change.   Past Medical History:  Past Medical History:  Diagnosis Date   Anxiety     Past Surgical History:  Procedure Laterality Date   MOUTH SURGERY      Family Psychiatric History: As mentioned in initial H&P, reviewed today, no change  Family History:  Family History  Problem Relation Age of Onset   Anxiety disorder Mother    Depression Mother     Social History:  Social History   Socioeconomic History   Marital status: Single    Spouse name: Not on file   Number of children: 0   Years of education: Not on file   Highest education level: Some college, no degree  Occupational History   Not on file  Tobacco Use   Smoking status: Never   Smokeless tobacco: Never  Vaping Use   Vaping status: Never Used  Substance and Sexual Activity   Alcohol use: Yes    Alcohol/week: 3.0 - 5.0 standard drinks of alcohol    Types: 3 - 4 Shots of liquor per week   Drug use: Not Currently   Sexual activity: Not Currently  Other Topics Concern   Not on file  Social History Narrative   Not on file   Social Determinants of Health   Financial Resource Strain: Low Risk  (11/17/2022)   Received from [facility]    Overall Financial Resource Strain (CARDIA)    Difficulty of Paying Living Expenses: Not hard at all  Food Insecurity: No Food Insecurity (11/17/2022)   Received from [facility], [facility]   Hunger Vital Sign    Worried About Running Out of Food in the Last Year: Never true    Ran Out of Food in the Last Year: Never true  Transportation Needs: No Transportation Needs (11/17/2022)   Received from [facility], [facility]   [facility] - Transportation    Lack of Transportation (Medical): No    Lack of Transportation (Non-Medical): No  Physical Activity: Sufficiently Active (06/23/2018)   Exercise Vital Sign    Days of Exercise per Week: 5 days    Minutes of Exercise per Session: 50 min  Stress: Not on file  Social Connections: Unknown (06/23/2018)   Social Connection and Isolation Panel [NHANES]    Frequency of Communication with Friends and Family: Not on file    Frequency of Social Gatherings with Friends and Family: Not on file    Attends Religious Services: More than 4 times per year    Active Member of [employer] or Organizations: Yes    Attends Banker Meetings: More than 4 times per year    Marital Status: Never married    Allergies:  Allergies  Allergen Reactions   No Known Allergies     Metabolic Disorder Labs: No results found for: "HGBA1C", "MPG" Lab Results  Component Value Date   PROLACTIN 13.5 02/01/2020   No results found for: "CHOL", "TRIG", "HDL", "CHOLHDL", "VLDL", "LDLCALC" Lab Results  Component Value Date   TSH 2.810 10/02/2022   TSH 3.700 04/23/2021    Therapeutic Level Labs: No results found for: "LITHIUM" No results found for: "VALPROATE" No results found for: "CBMZ"  Current Medications: Current Outpatient Medications  Medication Sig Dispense Refill   busPIRone (BUSPAR) 10 MG tablet TAKE 2 TABLETS BY MOUTH 2 TIMES DAILY. 360 tablet 0   calcitRIOL (ROCALTROL) 0.25 MCG capsule TAKE 1 CAPSULE BY MOUTH EVERY MORNING AND 1  CAPSULE EVERY EVENING 60 capsule 3   Calcium Carb-Cholecalciferol (CALCIUM PLUS VITAMIN D3 PO) Take 1,200 mg by mouth. Plus 25 mcg Vitamin D3     Calcium Carbonate-Vitamin D 600-400 MG-UNIT tablet Take by mouth. (Patient not taking: Reported on 10/07/2022)     fluticasone (FLONASE) 50 MCG/ACT nasal spray Place into the nose.     Lactobacillus (AZO COMPLETE FEMININE BALANCE PO) AZO Complete Feminine Balance     lamoTRIgine (LAMICTAL) 25 MG tablet TAKE 1 TABLET BY MOUTH TWICE A DAY 180 tablet 1   levocetirizine (XYZAL) 5 MG tablet Take 5 mg by mouth every evening.     levothyroxine (SYNTHROID) 50 MCG tablet TAKE 1 TABLET BY MOUTH DAILY 90 tablet  2   lurasidone (LATUDA) 40 MG TABS tablet Take 1 tablet (40 mg total) by mouth daily with supper. 90 tablet 1   norgestimate-ethinyl estradiol (ORTHO-CYCLEN) 0.25-35 MG-MCG tablet Take 1 tablet by mouth daily.     promethazine (PHENERGAN) 25 MG tablet Take 1 tablet (25 mg total) by mouth every 6 (six) hours as needed for nausea or vomiting. 8 tablet 0   traZODone (DESYREL) 100 MG tablet TAKE 1 TABLET BY MOUTH EVERYDAY AT BEDTIME 90 tablet 1   Current Facility-Administered Medications  Medication Dose Route Frequency Provider Last Rate Last Admin   betamethasone acetate-betamethasone sodium phosphate (CELESTONE) injection 3 mg  3 mg Intra-articular Once Felecia Shelling, DPM         Musculoskeletal: Strength & Muscle Tone: unable to assess since visit was over the telemedicine. Gait & Station: unable to assess since visit was over the telemedicine. Patient leans: N/A  Psychiatric Specialty Exam: ROSReview of 12 systems negative except as mentioned in HPI   There were no vitals taken for this visit.There is no height or weight on file to calculate BMI.  General Appearance: Casual and Fairly Groomed  Eye Contact:  Good  Speech:  Clear and Coherent and Normal Rate  Volume:  Normal  Mood:  "okay..."  Affect:  Appropriate, Congruent, and Restricted   Thought Process:  Goal Directed and Linear  Orientation:  Full (Time, Place, and Person)  Thought Content: Logical   Suicidal Thoughts:  No  Homicidal Thoughts:  No  Memory:  Immediate;   Fair Recent;   Fair Remote;   Fair  Judgement:  Fair  Insight:  Fair  Psychomotor Activity:  Normal  Concentration:  Concentration: Good and Attention Span: Good  Recall:  Good  Fund of Knowledge: Good  Language: Good  Akathisia:  No    AIMS (if indicated): not done  Assets:  Communication Skills Desire for Improvement Financial Resources/Insurance Housing Leisure Time Physical Health Social Support Talents/Skills Transportation Vocational/Educational  ADL's:  Intact  Cognition: WNL  Sleep:   Good   Screenings: Flowsheet Row ED from 08/14/2021 in [facility] at [facility]  C-SSRS RISK CATEGORY No Risk        Assessment and Plan:   27 yo F with hx of anxiety disorder on Zoloft since March of 2019 and was previously in counseling at [facility].  She is diagnosed with generalized anxiety disorder with panic attacks and has responded well to Zoloft 100 mg once a day and BuSpar 10 mg 2 times a day. However recently has noticed more mood fluctuation, irritability, agitation, depressed mood, sleep changes which initially was thought to be consistent with MDD. However Zoloft and Wellbutrin both worsened her symptoms, and she continued to experience mood lability, irritability, agitation, depressed mood, sleep changes, recently noted self applying to 100 jobs and there is a strong +ve family of bipolar disorder and therefore considering the dx of Bipolar Disorder.  She was started on Latuda as pt's mother and brother has responded well to it.   At her appointment in 04/2021, she reported worsening of mood lability, anxiety and sleep in the context of  psychosocial stressors(not getting a job yet, overthinking about her future and  relationships). She reported that she was more tearful, sad, has lack of motivation attending school, problems with sleep and energy, which appeared to be most likely in the context of her ongoing psychosocial stressors.   Update on 02/06/23 - She appears  to have some recent new stressors since last week and seems to have worsened her mood and anxiety, and prior to that she was doing well. Overall anxiety and mood appears stable and therefore recommending to continue with current medications. She is also seeing a therapist on a regular basis.   Plan as mentioned below.      Plan: Problem 1: Mood(chronic and stable) - Continue with Lamictal 25 mg BID - Continue Latuda 40 mg daily with dinner  - Risks and benefits discussed at the initiation.  - Ind therapy with Shara Blazing at [facility].  Problem 2: Generalized Anxiety with Panic attack(chronic andstable) Plan:           - Continue with Buspar 20 mg BID and take Buspar 10 mg at noon if needed.           - Follow up in 12 weeks or early if needed. .   Problem 3: Sleep Plan:  - Taking Trazodone 50 mg at bedtime now and continues to sleep well on the reduced dose.          MDM = 2 or more chronic stable conditions + med management        Colleen Smalling, MD 02/06/2023, 10:46 AM

## 2023-02-12 ENCOUNTER — Encounter: Payer: Self-pay | Admitting: Endocrinology

## 2023-02-12 ENCOUNTER — Other Ambulatory Visit: Payer: Self-pay

## 2023-02-12 DIAGNOSIS — E038 Other specified hypothyroidism: Secondary | ICD-10-CM

## 2023-02-12 DIAGNOSIS — E201 Pseudohypoparathyroidism: Secondary | ICD-10-CM

## 2023-02-17 LAB — TSH: TSH: 2.22 u[IU]/mL (ref 0.450–4.500)

## 2023-02-17 LAB — BASIC METABOLIC PANEL
BUN/Creatinine Ratio: 11 (ref 9–23)
BUN: 9 mg/dL (ref 6–20)
CO2: 20 mmol/L (ref 20–29)
Calcium: 9.2 mg/dL (ref 8.7–10.2)
Chloride: 102 mmol/L (ref 96–106)
Creatinine, Ser: 0.83 mg/dL (ref 0.57–1.00)
Glucose: 76 mg/dL (ref 70–99)
Potassium: 5.1 mmol/L (ref 3.5–5.2)
Sodium: 140 mmol/L (ref 134–144)
eGFR: 100 mL/min/{1.73_m2} (ref >59)

## 2023-02-17 LAB — T4, FREE: Free T4: 1.18 ng/dL (ref 0.82–1.77)

## 2023-02-21 ENCOUNTER — Ambulatory Visit
Admission: EM | Admit: 2023-02-21 | Discharge: 2023-02-21 | Disposition: A | Discharge status: Home / Self Care | Payer: BC Managed Care – PPO | Source: Home / Self Care

## 2023-02-21 DIAGNOSIS — U071 COVID-19: Secondary | ICD-10-CM | POA: Diagnosis not present

## 2023-02-21 DIAGNOSIS — J069 Acute upper respiratory infection, unspecified: Secondary | ICD-10-CM

## 2023-02-21 HISTORY — DX: Disorder of thyroid, unspecified: E07.9

## 2023-02-21 NOTE — ED Triage Notes (Signed)
Patient to Urgent Care with complaints of positive Covid test this morning. Requests Covid test to confirm.  Cold symptoms (sore throat/ nasal congestion/ headaches/ possible fevers) that started on Wednesday night.

## 2023-02-21 NOTE — Discharge Instructions (Addendum)
Your COVID test is pending.    Take Tylenol or ibuprofen as needed for fever or discomfort.  Take Mucinex as needed for congestion.  Rest and keep yourself hydrated.    Follow-up with your primary care provider if your symptoms are not improving.

## 2023-02-21 NOTE — ED Provider Notes (Signed)
[name]    CSN: [id number] Arrival date & time: 02/21/23  0857      History   Chief Complaint Chief Complaint  Patient presents with   Covid Positive    HPI AEVAH SODERMAN is a 27 y.o. female.  Patient presents with 2 to 3-day history of congestion, cough, headache.  She tested positive for COVID at home this morning.  She requests a PCR test.  No OTC medications taken today.  She denies fever, rash, shortness of breath, or other symptoms.  Her medical history includes bipolar disorder, mood disorder, anxiety, seasonal allergies, thyroid disease.  The history is provided by the patient and medical records.    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      Home Medications    Prior to Admission medications   Medication Sig Start Date End Date Taking? Authorizing Provider  busPIRone (BUSPAR) 10 MG tablet TAKE 2 TABLETS BY MOUTH 2 TIMES DAILY. 01/05/23   Myrlene Broker, MD  calcitRIOL (ROCALTROL) 0.25 MCG capsule TAKE 1 CAPSULE BY MOUTH EVERY MORNING AND 1 CAPSULE EVERY EVENING 11/20/22   Reather Littler, MD  Calcium Carb-Cholecalciferol (CALCIUM PLUS VITAMIN D3 PO) Take 1,200 mg by mouth. Plus 25 mcg Vitamin D3    [provider]  Calcium Carbonate-Vitamin D 600-400 MG-UNIT tablet Take by mouth. Patient not taking: Reported on 10/07/2022  12/30/19   [provider]  fluticasone (FLONASE) 50 MCG/ACT nasal spray Place into the nose. 06/23/15   [provider]  Lactobacillus (AZO COMPLETE FEMININE BALANCE PO) AZO Complete Feminine Balance    [provider]  lamoTRIgine (LAMICTAL) 25 MG tablet TAKE 1 TABLET BY MOUTH TWICE A DAY 12/15/22   [name], MD  levocetirizine (XYZAL) 5 MG tablet Take 5 mg by mouth every evening.    [provider]  levothyroxine (SYNTHROID) 50 MCG tablet TAKE 1 TABLET BY MOUTH DAILY 10/17/22   Reather Littler, MD  lurasidone (LATUDA) 40 MG TABS tablet Take 1 tablet (40 mg total) by mouth daily with supper. 02/06/23   [name], MD  norgestimate-ethinyl estradiol (ORTHO-CYCLEN) 0.25-35 MG-MCG tablet Take 1 tablet by mouth daily. 12/19/20   [provider]  promethazine (PHENERGAN) 25 MG tablet Take 1 tablet (25 mg total) by mouth every 6 (six) hours as needed for nausea or vomiting. 08/14/21   Willy Eddy, MD  traZODone (DESYREL) 100 MG tablet TAKE 1 TABLET BY MOUTH EVERYDAY AT BEDTIME 01/05/23   Myrlene Broker, MD    Family History Family History  Problem Relation Age of Onset   Anxiety disorder Mother    Depression Mother     Social History Social History   Tobacco Use   Smoking status: Never   Smokeless tobacco: Never  Vaping Use   Vaping status: Never Used  Substance Use Topics   Alcohol use:  Yes    Alcohol/week: 3.0 - 5.0 standard drinks of alcohol    Types: 3 - 4 Shots of liquor per week   Drug use: Not Currently     Allergies   No known allergies   Review of Systems Review of Systems  Constitutional:  Negative for chills and fever.  HENT:  Positive for congestion. Negative for ear pain and sore throat.   Respiratory:  Positive for cough. Negative for shortness of breath.   Cardiovascular:  Negative for chest pain and palpitations.  Gastrointestinal:  Negative for diarrhea and vomiting.  Neurological:  Positive for  headaches. Negative for weakness and numbness.     Physical Exam Triage Vital Signs ED Triage Vitals  Encounter Vitals Group     BP 02/21/23 0922 123/83     Systolic BP Percentile --      Diastolic BP Percentile --      Pulse Rate 02/21/23 0922 (!) 104     Resp 02/21/23 0922 18     Temp 02/21/23 0922 98.1 F (36.7 C)     Temp src --      SpO2 02/21/23 0922 98 %     Weight --      Height --      Head Circumference --      Peak Flow --      Pain Score 02/21/23 0918 7     Pain Loc --      Pain Education --      Exclude from Growth Chart --    No data found.  Updated Vital Signs BP 123/83   Pulse (!) 104   Temp 98.1 F (36.7 C)   Resp 18   LMP 02/11/2023   SpO2 98%   Visual Acuity Right Eye Distance:   Left Eye Distance:   Bilateral Distance:    Right Eye Near:   Left Eye Near:    Bilateral Near:     Physical Exam Vitals and nursing note reviewed.  Constitutional:      General: She is not in acute distress.    Appearance: She is well-developed.  HENT:     Right Ear: Tympanic membrane normal.     Left Ear: Tympanic membrane normal.     Nose: Rhinorrhea present.     Mouth/Throat:     Mouth: Mucous membranes are moist.     Pharynx: Oropharynx is clear.  Cardiovascular:     Rate and Rhythm: Normal rate and regular rhythm.     Heart sounds: Normal heart sounds.  Pulmonary:     Effort: Pulmonary effort is normal. No respiratory distress.     Breath sounds: Normal breath sounds.  Musculoskeletal:     Cervical back: Neck supple.  Skin:    General: Skin is warm and dry.  Neurological:     Mental Status: She is alert.      UC Treatments / Results  Labs (all labs ordered are listed, but only abnormal results are displayed) Labs Reviewed  SARS CORONAVIRUS 2 (TAT 6-24 HRS)    EKG   Radiology No results found.  Procedures Procedures (including critical care time)  Medications Ordered in UC Medications - No data to display  Initial Impression  / Assessment and Plan / UC Course  I have reviewed the triage vital signs and the nursing notes.  Pertinent labs & imaging results that were available during my care of the patient were reviewed by me and considered in my medical decision making (see chart  for details).   Positive COVID test at home, viral URI.  Per patient request, PCR COVID pending.  If COVID positive, consider treatment with Paxlovid or molnupiravir.  GFR 100 on 02/16/2023.  Patient is not pregnant and not planning to get pregnant in the next couple of years.  She will be looking into both of these medications to consider treatment options.  Discussed symptomatic treatment including Tylenol or ibuprofen, Mucinex, rest, hydration.  Instructed patient to follow up with PCP if symptoms are not improving.  She agrees to plan of care.    Final Clinical Impressions(s) / UC Diagnoses   Final diagnoses:  Positive self-administered antigen test for COVID-19  Viral URI     Discharge Instructions      Your COVID test is pending.    Take Tylenol or ibuprofen as needed for fever or discomfort.  Take Mucinex as needed for congestion.  Rest and keep yourself hydrated.    Follow-up with your primary care provider if your symptoms are not improving.         ED Prescriptions   None    PDMP not reviewed this encounter.   Mickie Bail, NP 02/21/23 [phone]

## 2023-02-22 LAB — SARS CORONAVIRUS 2 (TAT 6-24 HRS): SARS Coronavirus 2: POSITIVE — AB

## 2023-02-22 NOTE — Telephone Encounter (Signed)
Telephone call to patient to discuss her positive COVID test.  She declines antiviral treatment.  She states she will continue symptomatic care.  Instructed her to follow-up with her PCP as needed.

## 2023-02-23 ENCOUNTER — Telehealth: Payer: BC Managed Care – PPO | Admitting: Endocrinology

## 2023-02-23 DIAGNOSIS — E038 Other specified hypothyroidism: Secondary | ICD-10-CM

## 2023-02-23 DIAGNOSIS — E201 Pseudohypoparathyroidism: Secondary | ICD-10-CM

## 2023-02-23 MED ORDER — CALCITRIOL 0.25 MCG PO CAPS: 0.2500 ug | ORAL_CAPSULE | Freq: Every day | ORAL | 3 refills | Status: DC

## 2023-02-23 NOTE — Progress Notes (Unsigned)
Patient ID: Colleen Tapia, female   DOB: 06-13-1996, 27 y.o.   MRN: [id number]           I connected with the above-named patient by video enabled telemedicine application and verified that I am speaking with the correct person. The patient was explained the limitations of evaluation and management by telemedicine and the availability of in person appointments.  Patient also understood that there may be a patient responsible charge related to this service . Location of the patient: Patient's home . Location of the provider: Physician office Only the patient and myself were participating in the encounter The patient understood the above statements and agreed to proceed.   Chief complaint: Endocrinology follow-up  History of Present Illness:  Pseudohypoparathyroidism   Background history: She went for routine visit with her PCP and was found to have a calcium was 6.9 in June 2021 She thinks that her calcium probably had been low previously also Ionized calcium was also low at 3.7, normal >4.5 PTH level 384  RECENT HISTORY:  She has had symptomatic calcium at the time of diagnosis and has been treated with calcitriol and calcium  Her pretreatment symptoms were tightness and cramping in her fingers, at times also a tightness in her face making it difficult to talk She was also complaining of feeling tired and having headaches   Calcium levels have been consistently normal with taking CALCITRIOL 0.25 mcg With her calcium being high normal with this regimen she is not taking 1 capsule alternating with 2 She takes her prescriptions with the help of pillbox regularly Has been continued on calcium supplements, using Os-Cal in the evening only, she is taking 1200 mg  No recent symptoms of muscle twitching and no cramping in muscles Calcium is consistently normal around 9 but relatively higher at 9.6 compared to December  Lab Results  Component Value Date   CALCIUM 9.2 02/16/2023    CALCIUM 9.6 10/02/2022   CALCIUM 9.2 07/24/2022   CALCIUM 9.5 04/01/2022   CALCIUM 9.2 09/24/2021   CALCIUM 8.2 (L) 08/14/2021   Lab Results  Component Value Date   CALCIUM 9.2 02/16/2023   PHOS 4.2 07/13/2020    Other problems addressed today: See review of systems   Past Medical History:  Diagnosis Date  . Anxiety   . Thyroid disease     Past Surgical History:  Procedure Laterality Date  . WISDOM TOOTH EXTRACTION      Family History  Problem Relation Age of Onset  . Anxiety disorder Mother   . Depression Mother     Social History:  reports that she has never smoked. She has never used smokeless tobacco. She reports current alcohol use of about 3.0 - 5.0 standard drinks of alcohol per week. She reports that she does not currently use drugs.  Allergies:  Allergies  Allergen Reactions  . No Known Allergies     Allergies as of 02/23/2023       Reactions   No Known Allergies         Medication List        Accurate as of February 23, 2023  2:51 PM. If you have any questions, ask your nurse or doctor.          AZO COMPLETE FEMININE BALANCE PO AZO Complete Feminine Balance   busPIRone 10 MG tablet Commonly known as: BUSPAR TAKE 2 TABLETS BY MOUTH 2 TIMES DAILY.   calcitRIOL 0.25 MCG capsule Commonly known as: ROCALTROL TAKE 1 CAPSULE  BY MOUTH EVERY MORNING AND 1 CAPSULE EVERY EVENING   Calcium Carbonate-Vitamin D 600-400 MG-UNIT tablet Take by mouth.   CALCIUM PLUS VITAMIN D3 PO Take 1,200 mg by mouth. Plus 25 mcg Vitamin D3   fluticasone 50 MCG/ACT nasal spray Commonly known as: FLONASE Place into the nose.   lamoTRIgine 25 MG tablet Commonly known as: LAMICTAL TAKE 1 TABLET BY MOUTH TWICE A DAY   levocetirizine 5 MG tablet Commonly known as: XYZAL Take 5 mg by mouth every evening.   levothyroxine 50 MCG tablet Commonly known as: SYNTHROID TAKE 1 TABLET BY MOUTH DAILY   lurasidone 40 MG Tabs tablet Commonly known as: LATUDA Take 1  tablet (40 mg total) by mouth daily with supper.   norgestimate-ethinyl estradiol 0.25-35 MG-MCG tablet Commonly known as: ORTHO-CYCLEN Take 1 tablet by mouth daily.   promethazine 25 MG tablet Commonly known as: PHENERGAN Take 1 tablet (25 mg total) by mouth every 6 (six) hours as needed for nausea or vomiting.   traZODone 100 MG tablet Commonly known as: DESYREL TAKE 1 TABLET BY MOUTH EVERYDAY AT BEDTIME           Review of Systems  HYPOTHYROIDISM:   She had been complaining of fatigue and weight gain but no cold intolerance at her initial visit  Had a slightly low baseline free T4 level of 0.59 With levothyroxine supplements her fatigue improved  Her levothyroxine dose was decreased to 50 mcg in 7/22 when she had a high normal free T4 and symptoms of heat intolerance  Overall she feels fairly good and does not complain of fatigue  She takes her levothyroxine before breakfast consistently  Thyroid levels as follows  Lab Results  Component Value Date   TSH 2.220 02/16/2023   TSH 2.810 10/02/2022   TSH 3.700 04/23/2021   FREET4 1.18 02/16/2023   FREET4 1.19 10/02/2022   FREET4 1.23 04/01/2022    Weight history:  Wt Readings from Last 3 Encounters:  10/07/22 204 lb 9.6 oz (92.8 kg)  04/03/22 199 lb 3.2 oz (90.4 kg)  09/26/21 228 lb 9.6 oz (103.7 kg)   OLIGOMENORRHEA: This has been treated with birth control pills, no pretreatment estradiol levels available  Prolactin level was normal   BP Readings from Last 3 Encounters:  02/21/23 123/83  10/07/22 116/82  04/03/22 108/72     PHYSICAL EXAM:  LMP 02/11/2023     ASSESSMENT:   PSEUDOHYPOPARATHYROIDISM, familial  She had mild baseline symptoms of tightness in her hands and face and cramping with significantly long baseline calcium of 6.9 Symptoms have been consistently controlled with calcitriol and calcium supplements  She is taking 0.25 mcg 2 alternating with 1 daily with calcium supplement  once a day in the evening Calcium is again consistently normal, but still up and normal at 9.6  HYPOTHYROIDISM: She has secondary hypothyroidism with mildly low free T4 level at baseline Subjectively doing well, free T4 is again normal Renal function and potassium normal    PLAN:   She will reduce calcitriol to only 0.25 mcg but she will increase calcium to twice a day Call if having any muscle twitching or tingling  No change in levothyroxine dose of 50 mcg daily  Follow-up in 5-6 months     Annaliah Rivenbark 02/23/2023, 2:51 PM

## 2023-03-09 ENCOUNTER — Ambulatory Visit: Payer: BC Managed Care – PPO | Admitting: Endocrinology

## 2023-03-24 ENCOUNTER — Other Ambulatory Visit: Payer: Self-pay | Admitting: Endocrinology

## 2023-03-24 DIAGNOSIS — E201 Pseudohypoparathyroidism: Secondary | ICD-10-CM

## 2023-03-26 ENCOUNTER — Telehealth: Payer: Self-pay

## 2023-03-26 NOTE — Telephone Encounter (Signed)
I spoke with pt over the phone, she reported increased anxiety and usually she remembers feeling BuSpar in the afternoon after she is more anxious.  She also reported that she has been feeling that her mood is "all over the place".  We discussed to schedule BuSpar 10 to 20 mg at noon in addition to continuing 20 mg twice daily.  She asked if her Lamictal can be increased because of her mood, we discussed to give it a try to BuSpar first and if after a week she is still having challenges with mood, she can increase the dose of Lamictal to 50 mg at bedtime while continuing 25 mg in the morning.  We discussed the risk of rash associated with Lamictal, she verbalized understanding.  She will call back for any questions.

## 2023-03-26 NOTE — Telephone Encounter (Signed)
pt called states that she has been spiraling out of control. she has been crying uncontrollable. and she states that you had told her to take another buspar is she needs it but by the time she thinks about it she is in the middle of a breakdown.   Pt was last seen on 7-12 and next appt is 10-07

## 2023-04-04 IMAGING — CT CT ABD-PELV W/ CM
2 of 4 series · 16 of 46 positions shown, 18 images · IV contrast (omnipaque)
Comparison: None.

CLINICAL DATA: Generalized abdominal pain.  Emesis.

EXAM:
CT ABDOMEN AND PELVIS WITH CONTRAST
TECHNIQUE: Multidetector CT imaging of the abdomen and pelvis was performed
using the standard protocol following bolus administration of
intravenous contrast.
RADIATION DOSE REDUCTION: This exam was performed according to the
departmental dose-optimization program which includes automated
exposure control, adjustment of the mA and/or kV according to
patient size and/or use of iterative reconstruction technique.
CONTRAST:  100 mL Omnipaque

[Series 2: routine abd/pel with · axial · 0.98mm/px · z∈[-547,-82]mm · 13 of 103 slices shown, 15 images]
[im 5/103  soft-tissue]
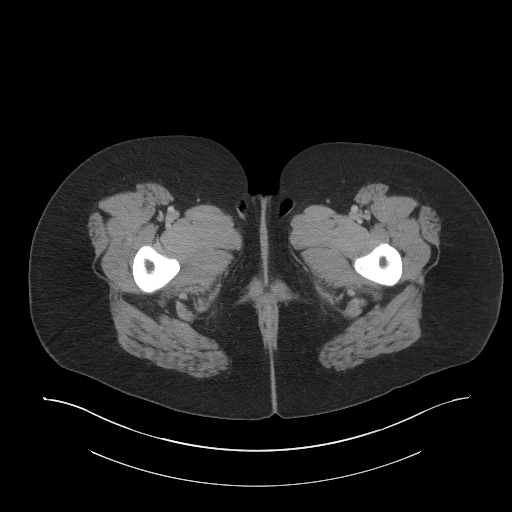
[im 5/103  bone]
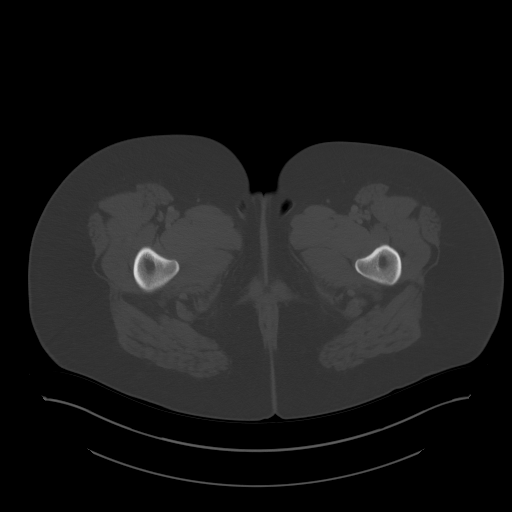
[im 14/103  soft-tissue]
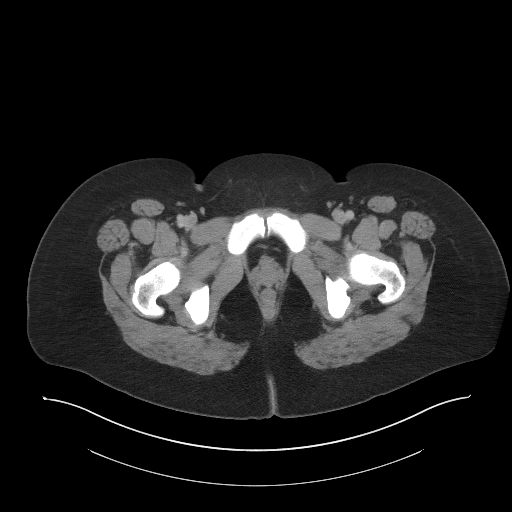
[im 24/103  soft-tissue]
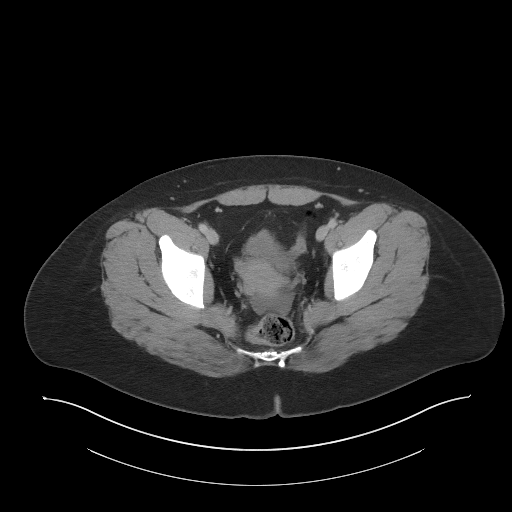
[im 28/103  soft-tissue]
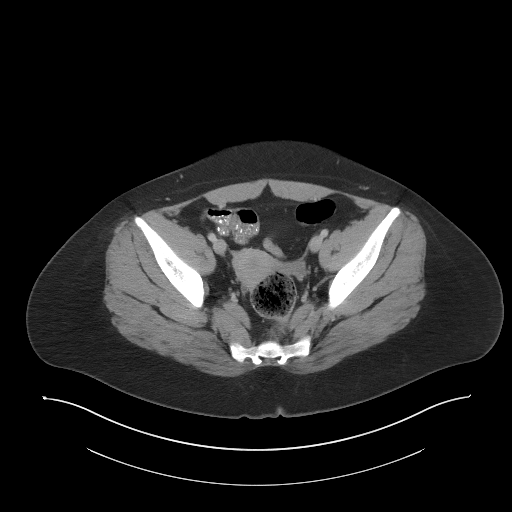
[im 38/103  soft-tissue]
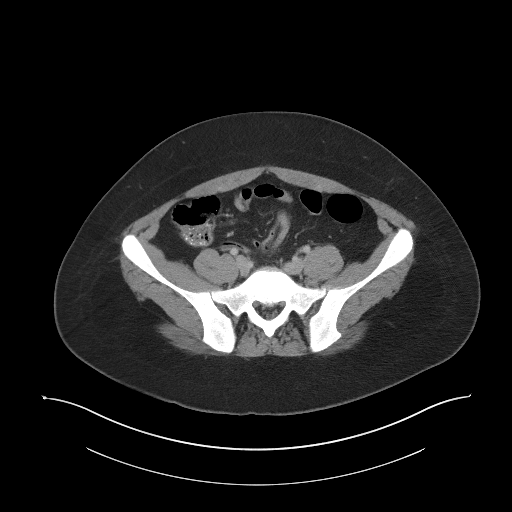
[im 42/103  soft-tissue]
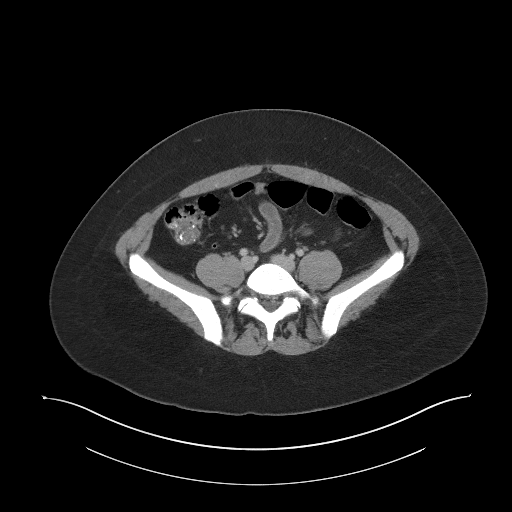
[im 52/103  soft-tissue]
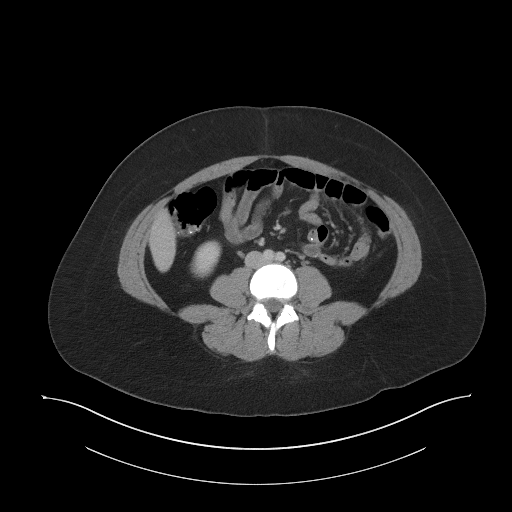
[im 61/103  soft-tissue]
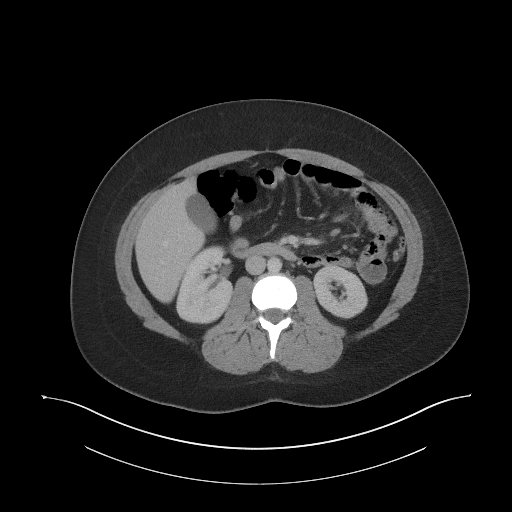
[im 65/103  soft-tissue]
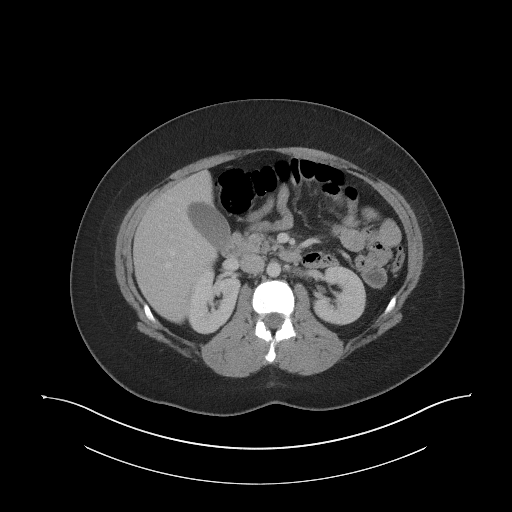
[im 65/103  bone]
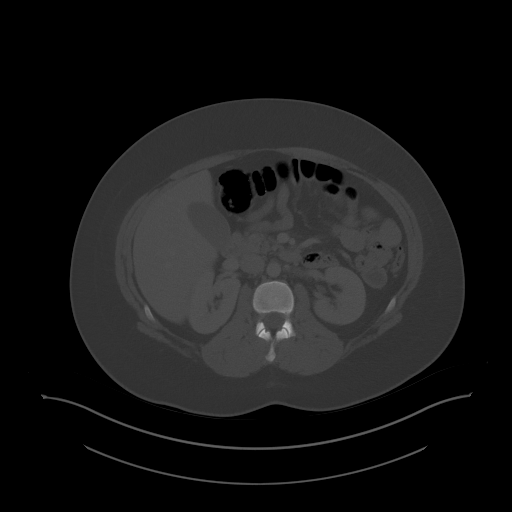
[im 75/103  soft-tissue]
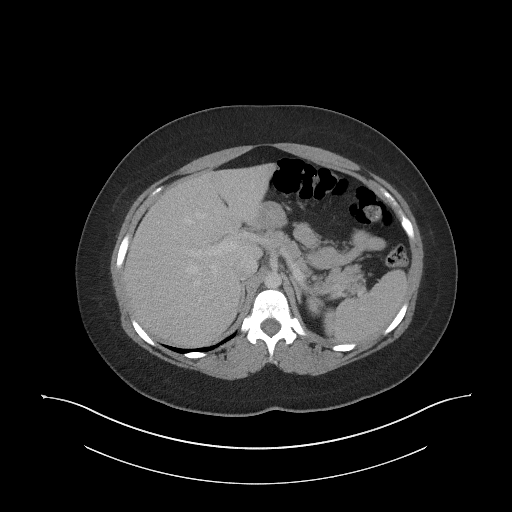
[im 79/103  soft-tissue]
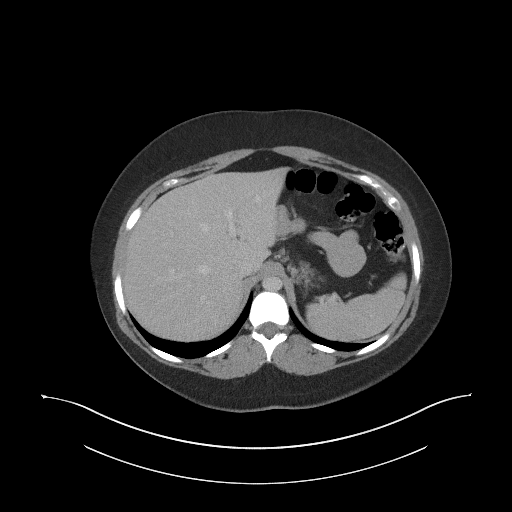
[im 89/103  soft-tissue]
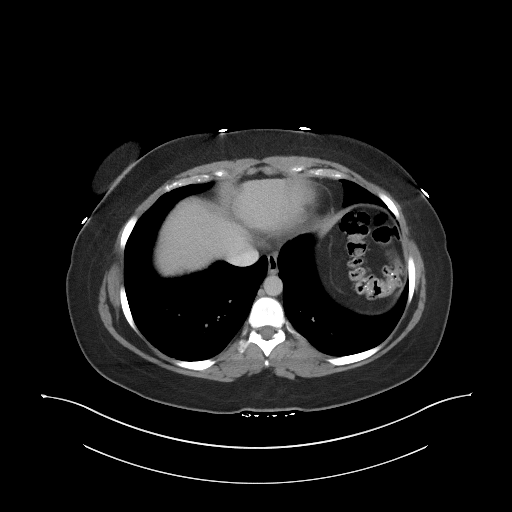
[im 98/103  soft-tissue]
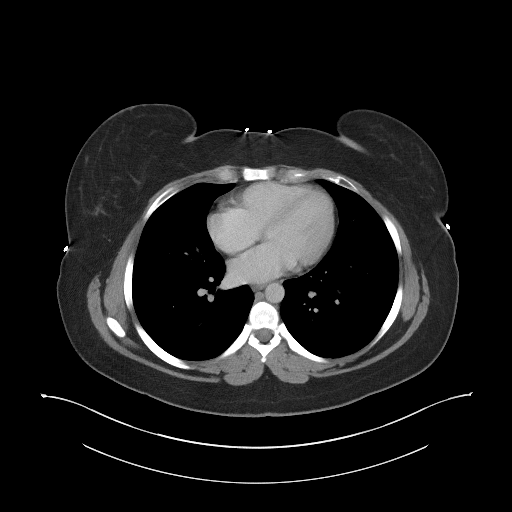

[Series 5: coronal st · coronal · 0.93mm/px · 3 of 102 slices shown]
[im 34/102  soft-tissue]
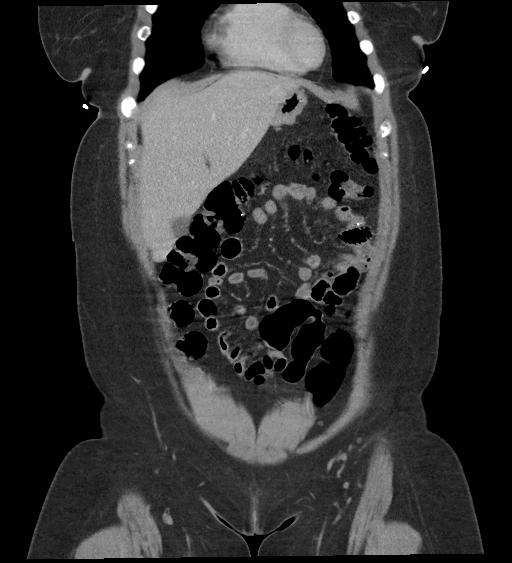
[im 45/102  soft-tissue]
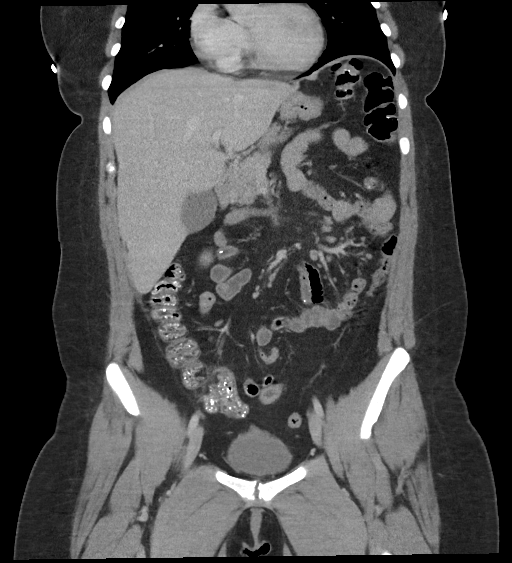
[im 57/102  soft-tissue]
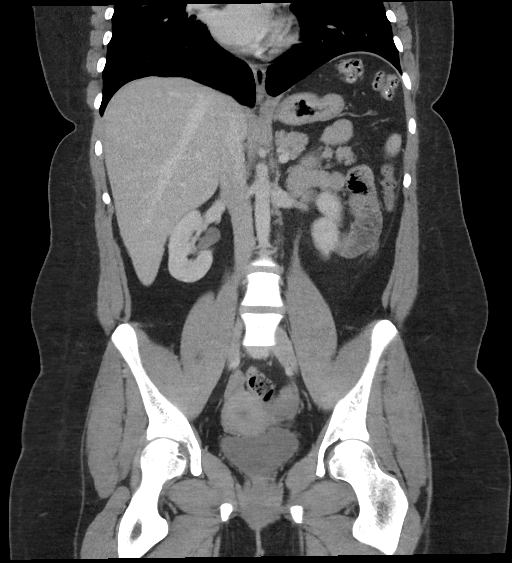

[16 of 46 positions shown; findings below may reference images not displayed]

FINDINGS: Lower chest: Lung bases are clear.

Hepatobiliary: No focal hepatic lesion. No biliary duct dilatation.
Gallbladder normal. Common bile duct is normal.

Pancreas: Pancreas is normal. No ductal dilatation. No pancreatic
inflammation.

Spleen: Normal spleen

Adrenals/urinary tract: Adrenal glands and kidneys are normal. The
ureters and bladder normal.

Stomach/Bowel: Stomach, small bowel, appendix, and cecum are normal.
The colon and rectosigmoid colon are normal.

Vascular/Lymphatic: Abdominal aorta is normal caliber. No periportal
or retroperitoneal adenopathy. No pelvic adenopathy.

Reproductive: Uterus and ovaries are normal. Small amount of simple
fluid density the fluid posterior uterus.

Other: No free air.  No peritoneal abnormality

Musculoskeletal: No aggressive osseous lesion.
IMPRESSION: 1. Normal appendix.
2. Normal gallbladder.
3. No obstructive uropathy.
4. No clear acute findings in the abdomen pelvis.
5. Small amount free fluid in the deep pelvis favored physiologic

## 2023-05-04 ENCOUNTER — Telehealth (INDEPENDENT_AMBULATORY_CARE_PROVIDER_SITE_OTHER): Payer: BC Managed Care – PPO | Admitting: Child and Adolescent Psychiatry

## 2023-05-04 DIAGNOSIS — F3175 Bipolar disorder, in partial remission, most recent episode depressed: Secondary | ICD-10-CM | POA: Diagnosis not present

## 2023-05-04 DIAGNOSIS — F410 Panic disorder [episodic paroxysmal anxiety] without agoraphobia: Secondary | ICD-10-CM

## 2023-05-04 DIAGNOSIS — F411 Generalized anxiety disorder: Secondary | ICD-10-CM | POA: Diagnosis not present

## 2023-05-04 MED ORDER — TRAZODONE HCL 100 MG PO TABS: ORAL_TABLET | ORAL | 5 refills | Status: DC

## 2023-05-04 MED ORDER — LAMOTRIGINE 25 MG PO TABS
25.0000 mg | ORAL_TABLET | Freq: Two times a day (BID) | ORAL | 5 refills | Status: DC
Start: 2023-05-04 — End: 2023-09-17

## 2023-05-04 MED ORDER — BUSPIRONE HCL 10 MG PO TABS
ORAL_TABLET | ORAL | 5 refills | Status: DC
Start: 2023-05-04 — End: 2023-05-18

## 2023-05-04 MED ORDER — LURASIDONE HCL 40 MG PO TABS: 40.0000 mg | ORAL_TABLET | Freq: Every day | ORAL | 5 refills | Status: DC

## 2023-05-04 NOTE — Progress Notes (Signed)
Virtual Visit via Video Note  I connected with Colleen Tapia on 05/04/23 at 11:00 AM EDT by a video enabled telemedicine application and verified that I am speaking with the correct person using two identifiers.  Location: Patient: College Provider: Office   I discussed the limitations of evaluation and management by telemedicine and the availability of in person appointments. The patient expressed understanding and agreed to proceed.  I discussed the assessment and treatment plan with the patient. The patient was provided an opportunity to ask questions and all were answered. The patient agreed with the plan and demonstrated an understanding of the instructions.   The patient was advised to call back or seek an in-person evaluation if the symptoms worsen or if the condition fails to improve as anticipated.  Colleen Smalling, MD     [facility] MD/PA/NP OP Progress Note  05/04/2023 11:26 AM [name]  MRN:  [id number]  Chief Complaint:  "I am doing ok..."  HPI: This is a 27 year old Caucasian female with hx of generalized anxiety disorder, MDD, panic attacks previously on Zoloft and BuSpar since March 2019 and also tried on Wellbutrin XL which caused more irritability and therefore discontinued and currently prescribed Latuda 40 mg once a day and Lamictal 25 mg BID for concerns regarding bipolar disorder and BuSpar for anxiety.   Colleen Tapia was seen and evaluated over telemedicine encounter for medication management follow-up.  She was present by herself and was evaluated alone.  In the interim since last appointment, she called and reported that she was having worsening of anxiety and mood instability.  Her anxiety appeared to have caused mood instability and therefore it was recommended to take BuSpar at lunch as well.  Today she reported that after 1 week of taking BuSpar at lunch, she started noticing significant improvement with her anxiety and her mood also improved subsequently.  She  reported that her mood is "neutral", rated around 6 out of 10, 10 being the best mood, and more stable.  She denied any SI or HI, sleeping better, eating well however she needs to make some changes to her diet due to her increased cholesterol recently.  She reported that she is planning to go to gym but she does not feel motivated, we discussed interventions that can help her get motivated to go to gym, and also help her with her mental health.  She verbalized understanding.  Because of her stability in her symptoms, we discussed to continue with current medications and follow-up again in about 3 months or earlier if needed.  She was seeing her therapist regularly, therapist had canceled some of her appointments recently because of her(therapist's) personal circumstances however patient is planning to get back on a regular schedule.  Visit Diagnosis:    ICD-10-CM   1. Generalized anxiety disorder with panic attacks  F41.1 busPIRone (BUSPAR) 10 MG tablet   F41.0     2. Bipolar disorder, in partial remission, most recent episode depressed (HCC)  F31.75 lamoTRIgine (LAMICTAL) 25 MG tablet            Past Psychiatric History: reviewed today, no change.   Past Medical History:  Past Medical History:  Diagnosis Date   Anxiety    Thyroid disease     Past Surgical History:  Procedure Laterality Date   WISDOM TOOTH EXTRACTION      Family Psychiatric History: As mentioned in initial H&P, reviewed today, no change  Family History:  Family History  Problem Relation Age of  Onset   Anxiety disorder Mother    Depression Mother     Social History:  Social History   Socioeconomic History   Marital status: Single    Spouse name: Not on file   Number of children: 0   Years of education: Not on file   Highest education level: Some college, no degree  Occupational History   Not on file  Tobacco Use   Smoking status: Never   Smokeless tobacco: Never  Vaping Use   Vaping status: Never  Used  Substance and Sexual Activity   Alcohol use: Yes    Alcohol/week: 3.0 - 5.0 standard drinks of alcohol    Types: 3 - 4 Shots of liquor per week   Drug use: Not Currently   Sexual activity: Not Currently  Other Topics Concern   Not on file  Social History Narrative   Not on file   Social Determinants of Health   Financial Resource Strain: Low Risk  (04/30/2023)   Received from [facility]   Overall Financial Resource Strain (CARDIA)    Difficulty of Paying Living Expenses: Not hard at all  Food Insecurity: No Food Insecurity (04/30/2023)   Received from [facility]   Hunger Vital Sign    Worried About Running Out of Food in the Last Year: Never true    Ran Out of Food in the Last Year: Never true  Transportation Needs: No Transportation Needs (04/30/2023)   Received from [facility] - Transportation    Lack of Transportation (Medical): No    Lack of Transportation (Non-Medical): No  Physical Activity: Sufficiently Active (06/23/2018)   Exercise Vital Sign    Days of Exercise per Week: 5 days    Minutes of Exercise per Session: 50 min  Stress: Not on file  Social Connections: Unknown (06/23/2018)   Social Connection and Isolation Panel [NHANES]    Frequency of Communication with Friends and Family: Not on file    Frequency of Social Gatherings with Friends and Family: Not on file    Attends Religious Services: More than 4 times per year    Active Member of [employer] or Organizations: Yes    Attends Banker Meetings: More than 4 times per year    Marital Status: Never married    Allergies:  Allergies  Allergen Reactions   No Known Allergies     Metabolic Disorder Labs: No results found for: "HGBA1C", "MPG" Lab Results  Component Value Date   PROLACTIN 13.5 02/01/2020   No results found for: "CHOL", "TRIG", "HDL", "CHOLHDL", "VLDL", "LDLCALC" Lab Results  Component Value Date   TSH 2.220 02/16/2023   TSH 2.810 10/02/2022     Therapeutic Level Labs: No results found for: "LITHIUM" No results found for: "VALPROATE" No results found for: "CBMZ"  Current Medications: Current Outpatient Medications  Medication Sig Dispense Refill   busPIRone (BUSPAR) 10 MG tablet Take 2 tablets (20 mg total) by mouth in the morning and in the evening and Take 1 tablet (10 mg total) at lunch.. 150 tablet 5   calcitRIOL (ROCALTROL) 0.25 MCG capsule Take 1 capsule (0.25 mcg total) by mouth daily. 90 capsule 3   Calcium Carb-Cholecalciferol (CALCIUM PLUS VITAMIN D3 PO) Take 1,200 mg by mouth. Plus 25 mcg Vitamin D3     Calcium Carbonate-Vitamin D 600-400 MG-UNIT tablet Take by mouth.     fluticasone (FLONASE) 50 MCG/ACT nasal spray Place into the nose.     Lactobacillus (AZO  COMPLETE FEMININE BALANCE PO) AZO Complete Feminine Balance     lamoTRIgine (LAMICTAL) 25 MG tablet Take 1 tablet (25 mg total) by mouth 2 (two) times daily. 60 tablet 5   levocetirizine (XYZAL) 5 MG tablet Take 5 mg by mouth every evening.     levothyroxine (SYNTHROID) 50 MCG tablet TAKE 1 TABLET BY MOUTH EVERY DAY 90 tablet 1   lurasidone (LATUDA) 40 MG TABS tablet Take 1 tablet (40 mg total) by mouth daily with supper. 30 tablet 5   norgestimate-ethinyl estradiol (ORTHO-CYCLEN) 0.25-35 MG-MCG tablet Take 1 tablet by mouth daily.     promethazine (PHENERGAN) 25 MG tablet Take 1 tablet (25 mg total) by mouth every 6 (six) hours as needed for nausea or vomiting. (Patient not taking: Reported on 02/23/2023) 8 tablet 0   traZODone (DESYREL) 100 MG tablet TAKE 1 TABLET BY MOUTH EVERYDAY AT BEDTIME 30 tablet 5   Current Facility-Administered Medications  Medication Dose Route Frequency Provider Last Rate Last Admin   betamethasone acetate-betamethasone sodium phosphate (CELESTONE) injection 3 mg  3 mg Intra-articular Once Felecia Shelling, DPM         Musculoskeletal: Strength & Muscle Tone: unable to assess since visit was over the telemedicine. Gait & Station:  unable to assess since visit was over the telemedicine. Patient leans: N/A  Psychiatric Specialty Exam: ROSReview of 12 systems negative except as mentioned in HPI   There were no vitals taken for this visit.There is no height or weight on file to calculate BMI.  General Appearance: Casual and Fairly Groomed  Eye Contact:  Good  Speech:  Clear and Coherent and Normal Rate  Volume:  Normal  Mood:  "ok.."  Affect:  Appropriate, Congruent, and Restricted  Thought Process:  Goal Directed and Linear  Orientation:  Full (Time, Place, and Person)  Thought Content: Logical   Suicidal Thoughts:  No  Homicidal Thoughts:  No  Memory:  Immediate;   Fair Recent;   Fair Remote;   Fair  Judgement:  Fair  [facility]:  Fair  Psychomotor Activity:  Normal  Concentration:  Concentration: Good and Attention Span: Good  Recall:  Good  Fund of Knowledge: Good  Language: Good  Akathisia:  No    AIMS (if indicated): not done  Assets:  Communication Skills Desire for Improvement Financial Resources/Insurance Housing Leisure Time Physical Health Social Support Talents/Skills Transportation Vocational/Educational  ADL's:  Intact  Cognition: WNL  Sleep:   Good   Screenings: Flowsheet Row ED from 02/21/2023 in [facility]  ED from 08/14/2021 in [facility] Emergency Department at [facility]  C-SSRS RISK CATEGORY No Risk No Risk        Assessment and Plan:   27 yo F with hx of anxiety disorder on Zoloft since March of 2019 and was previously in counseling at [facility].  She is diagnosed with generalized anxiety disorder with panic attacks and has responded well to Zoloft 100 mg once a day and BuSpar 10 mg 2 times a day. However recently has noticed more mood fluctuation, irritability, agitation, depressed mood, sleep changes which initially was thought to be consistent with MDD. However Zoloft and Wellbutrin both worsened her  symptoms, and she continued to experience mood lability, irritability, agitation, depressed mood, sleep changes, recently noted self applying to 100 jobs and there is a strong +ve family of bipolar disorder and therefore considering the dx of Bipolar Disorder.  She was started on Jordan as  pt's mother and brother has responded well to it.   At her appointment in 04/2021, she reported worsening of mood lability, anxiety and sleep in the context of  psychosocial stressors(not getting a job yet, overthinking about her future and relationships). She reported that she was more tearful, sad, has lack of motivation attending school, problems with sleep and energy, which appeared to be most likely in the context of her ongoing psychosocial stressors.   Update on 05/04/23 -she appears to have improvement with her anxiety after last increasing BuSpar.  Mood seems stable.  Continues to see a therapist regularly.    Plan as mentioned below.      Plan: Problem 1: Mood(chronic and stable) - Continue with Lamictal 25 mg BID - Continue Latuda 40 mg daily with dinner  - Risks and benefits discussed at the initiation.  - Ind therapy with Shara Blazing at [facility]. - Recommended life style interventions for weight management and mental health.   Problem 2: Generalized Anxiety with Panic attack(chronic andstable) Plan:           - Continue with Buspar 20 mg BID and take Buspar 10 mg at noon            - Follow up in 12 weeks or early if needed. .   Problem 3: Sleep Plan:  - Taking Trazodone 50 mg at bedtime now and continues to sleep well on the reduced dose.          MDM = 2 or more chronic stable conditions + med management        Colleen Smalling, MD 05/04/2023, 11:26 AM

## 2023-05-08 ENCOUNTER — Telehealth: Payer: BC Managed Care – PPO | Admitting: Child and Adolescent Psychiatry

## 2023-05-16 ENCOUNTER — Ambulatory Visit
Admission: RE | Admit: 2023-05-16 | Discharge: 2023-05-16 | Disposition: A | Discharge status: Home / Self Care | Payer: BC Managed Care – PPO | Source: Ambulatory Visit | Attending: Physician Assistant | Admitting: Physician Assistant

## 2023-05-16 VITALS — BP 120/85 | HR 98 | Temp 97.9°F | Resp 18

## 2023-05-16 DIAGNOSIS — N898 Other specified noninflammatory disorders of vagina: Secondary | ICD-10-CM | POA: Diagnosis present

## 2023-05-16 DIAGNOSIS — R300 Dysuria: Secondary | ICD-10-CM | POA: Diagnosis present

## 2023-05-16 LAB — POCT URINALYSIS DIP (MANUAL ENTRY)
Bilirubin, UA: NEGATIVE
Glucose, UA: NEGATIVE mg/dL
Ketones, POC UA: NEGATIVE mg/dL
Leukocytes, UA: NEGATIVE
Nitrite, UA: NEGATIVE
Protein Ur, POC: NEGATIVE mg/dL
Spec Grav, UA: 1.01 (ref 1.010–1.025)
Urobilinogen, UA: 0.2 U/dL
pH, UA: 6.5 (ref 5.0–8.0)

## 2023-05-16 LAB — POCT URINE PREGNANCY: Preg Test, Ur: NEGATIVE

## 2023-05-16 NOTE — ED Provider Notes (Signed)
Colleen Tapia    CSN: [id number] Arrival date & time: 05/16/23  1405      History   Chief Complaint Chief Complaint  Patient presents with   Urinary Frequency    Possible uti or bv or yeast infection - Entered by patient    HPI Colleen Tapia is a 27 y.o. female.   Patient presents today with a several day history of recurrent vaginal itching and burning as well as dysuria.  She describes the dysuria as more of a painful sensation when the urine touches her skin rather than true dysuria.  Denies any frequency or urgency.  She was treated for UTI several weeks ago with Macrobid.  Her urine culture was negative and so this medication was discontinued.  She was encouraged to take Azo and stop this several days ago.  She has not had any vaginal discharge but did have a vaginal odor.  She previously attributed this to sweating.  She took Diflucan late last night and has not noticed any improvement of symptoms.  Has not tried any over-the-counter medications for symptom management.  Denies any abdominal pain, pelvic pain, fever, nausea, vomiting.  She is confident that she is not pregnant.  No concern for sexually transmitted infection.    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      Home Medications    Prior to Admission medications   Medication Sig Start  Date End Date Taking? Authorizing Provider  busPIRone (BUSPAR) 10 MG tablet Take 2 tablets (20 mg total) by mouth in the morning and in the evening and Take 1 tablet (10 mg total) at lunch.. 05/04/23   [name], MD  calcitRIOL (ROCALTROL) 0.25 MCG capsule Take 1 capsule (0.25 mcg total) by mouth daily. 02/23/23   Reather Littler, MD  Calcium Carb-Cholecalciferol (CALCIUM PLUS VITAMIN D3 PO) Take 1,200 mg by mouth. Plus 25 mcg Vitamin D3    [provider]  Calcium Carbonate-Vitamin D 600-400 MG-UNIT tablet Take by mouth. 12/30/19   [provider]  fluticasone (FLONASE) 50 MCG/ACT nasal spray Place into the nose. 06/23/15   [provider]  Lactobacillus (AZO COMPLETE FEMININE BALANCE PO) AZO Complete Feminine Balance    [provider]  lamoTRIgine (LAMICTAL) 25 MG tablet Take 1 tablet (25 mg total) by mouth 2 (two) times daily. 05/04/23   [name], MD  levocetirizine (XYZAL) 5 MG tablet Take 5 mg by mouth every evening.    [provider]  levothyroxine (SYNTHROID) 50 MCG tablet TAKE 1 TABLET BY MOUTH EVERY DAY 03/24/23   Reather Littler, MD  lurasidone (LATUDA) 40 MG TABS tablet Take 1 tablet (40 mg total) by mouth daily with supper. 05/04/23   [name], MD  norgestimate-ethinyl estradiol (ORTHO-CYCLEN) 0.25-35 MG-MCG tablet Take 1 tablet by mouth daily. 12/19/20   [provider]  promethazine (  PHENERGAN) 25 MG tablet Take 1 tablet (25 mg total) by mouth every 6 (six) hours as needed for nausea or vomiting. Patient not taking: Reported on 02/23/2023 08/14/21   Willy Eddy, MD  traZODone (DESYREL) 100 MG tablet TAKE 1 TABLET BY MOUTH EVERYDAY AT BEDTIME 05/04/23   [name], MD    Family History Family History  Problem Relation Age of Onset   Anxiety disorder Mother    Depression Mother     Social History Social History   Tobacco Use   Smoking status: Never   Smokeless tobacco: Never  Vaping Use   Vaping status:  Never Used  Substance Use Topics   Alcohol use: Yes    Alcohol/week: 3.0 - 5.0 standard drinks of alcohol    Types: 3 - 4 Shots of liquor per week   Drug use: Not Currently     Allergies   No known allergies   Review of Systems Review of Systems  Constitutional:  Positive for activity change. Negative for appetite change, fatigue and fever.  Gastrointestinal:  Negative for abdominal pain, diarrhea, nausea and vomiting.  Genitourinary:  Positive for dysuria and vaginal discharge. Negative for frequency, urgency, vaginal bleeding and vaginal pain.     Physical Exam Triage Vital Signs ED Triage Vitals  Encounter Vitals Group     BP 05/16/23 1422 120/85     Systolic BP Percentile --      Diastolic BP Percentile --      Pulse Rate 05/16/23 1422 (!) 118     Resp 05/16/23 1422 18     Temp 05/16/23 1422 97.9 F (36.6 C)     Temp src --      SpO2 05/16/23 1422 98 %     Weight --      Height --      Head Circumference --      Peak Flow --      Pain Score 05/16/23 1448 5     Pain Loc --      Pain Education --      Exclude from Growth Chart --    No data found.  Updated Vital Signs BP 120/85   Pulse 98   Temp 97.9 F (36.6 C)   Resp 18   LMP 05/06/2023   SpO2 98%   Visual Acuity Right Eye Distance:   Left Eye Distance:   Bilateral Distance:    Right Eye Near:   Left Eye Near:    Bilateral Near:     Physical Exam Vitals reviewed.  Constitutional:      General: She is awake. She is not in acute distress.    Appearance: Normal appearance. She is well-developed. She is not ill-appearing.     Comments: Very pleasant female appears stated age in no acute distress sitting comfortably in exam room  HENT:     Head: Normocephalic and atraumatic.  Cardiovascular:     Rate and Rhythm: Normal rate and regular rhythm.     Heart sounds: Normal heart sounds, S1 normal and S2 normal. No murmur heard. Pulmonary:     Effort: Pulmonary effort is normal.     Breath sounds:  Normal breath sounds. No wheezing, rhonchi or rales.     Comments: Clear to auscultation bilaterally Abdominal:     General: Bowel sounds are normal.     Palpations: Abdomen is soft.     Tenderness: There is abdominal tenderness in the suprapubic area. There is no right CVA tenderness, left CVA  tenderness, guarding or rebound.     Comments: Mild tenderness to palpation in suprapubic region.  No evidence of acute abdomen on physical exam.  Psychiatric:        Behavior: Behavior is cooperative.      UC Treatments / Results  Labs (all labs ordered are listed, but only abnormal results are displayed) Labs Reviewed  POCT URINALYSIS DIP (MANUAL ENTRY) - Abnormal; Notable for the following components:      Result Value   Color, UA light yellow (*)    Blood, UA trace-lysed (*)    All other components within normal limits  URINE CULTURE  POCT URINE PREGNANCY  CERVICOVAGINAL ANCILLARY ONLY    EKG   Radiology No results found.  Procedures Procedures (including critical care time)  Medications Ordered in UC Medications - No data to display  Initial Impression / Assessment and Plan / UC Course  I have reviewed the triage vital signs and the nursing notes.  Pertinent labs & imaging results that were available during my care of the patient were reviewed by me and considered in my medical decision making (see chart for details).     Patient was initially tachycardic but on retake this normalized.  She is otherwise well-appearing, afebrile, nontoxic.  Vital signs of physical exam are reassuring with no indication for emergent evaluation or imaging.  UA showed trace blood but was otherwise normal with no evidence of infection.  Given she is experiencing some dysuria we will send this for culture but defer antibiotics until culture results are available.  Discussed symptoms are more likely related to a vaginal yeast infection triggered by the antibiotic use recently.  She is already taken  a dose of Diflucan.  Vaginal swab was collected and is pending.  She had no specific concern for STI so additional testing was not obtained.  Discussed that her symptoms may continue to improve since she just took the Diflucan last night and has not had enough time to work.  Did recommend that she use hypoallergenic soaps or detergents and wear loosefitting cotton underwear.  She is to avoid bladder irritants and drink plenty of fluid.  Discussed that if her symptoms are not improving quickly she should return for reevaluation.  If she has any worsening or changing symptoms including pelvic pain, abdominal pain, fever, nausea, vomiting she needs to be seen immediately.  Strict return precautions given.  Final Clinical Impressions(s) / UC Diagnoses   Final diagnoses:  Dysuria  Vaginal irritation     Discharge Instructions      Your urine had a little bit of blood but was otherwise normal.  We will send this for culture but I do not think you have a urinary tract infection.  We will contact you if we need to arrange treatment based on this result.  I have also sent off a swab to look for yeast and bacterial vaginosis.  We will contact you if need to arrange treatment based on this result.  Wear loosefitting cotton underwear and use hypoallergenic soaps and detergents.  Drink plenty of water and avoid bladder irritants including caffeine.  Follow-up with your primary care.  If anything worsens and you have severe abdominal pain, fever, nausea, vomiting, pelvic pain, abnormal discharge you should be seen immediately.     ED Prescriptions   None    PDMP not reviewed this encounter.   Jeani Hawking, PA-C 05/16/23 1513

## 2023-05-16 NOTE — ED Triage Notes (Signed)
Patient to Urgent Care with complaints of vaginal itching/ burning. Denies any fevers. Endorses generalized body aches.  Recent UTI. Took Macrobid and pyridium.   Took dose of diflucan yesterday. No improvements. Taking azo cranberry pills.

## 2023-05-16 NOTE — Discharge Instructions (Signed)
Your urine had a little bit of blood but was otherwise normal.  We will send this for culture but I do not think you have a urinary tract infection.  We will contact you if we need to arrange treatment based on this result.  I have also sent off a swab to look for yeast and bacterial vaginosis.  We will contact you if need to arrange treatment based on this result.  Wear loosefitting cotton underwear and use hypoallergenic soaps and detergents.  Drink plenty of water and avoid bladder irritants including caffeine.  Follow-up with your primary care.  If anything worsens and you have severe abdominal pain, fever, nausea, vomiting, pelvic pain, abnormal discharge you should be seen immediately.

## 2023-05-18 ENCOUNTER — Telehealth: Payer: Self-pay

## 2023-05-18 ENCOUNTER — Telehealth: Payer: Self-pay | Admitting: Emergency Medicine

## 2023-05-18 DIAGNOSIS — R300 Dysuria: Secondary | ICD-10-CM

## 2023-05-18 DIAGNOSIS — F410 Panic disorder [episodic paroxysmal anxiety] without agoraphobia: Secondary | ICD-10-CM

## 2023-05-18 LAB — CERVICOVAGINAL ANCILLARY ONLY
Bacterial Vaginitis (gardnerella): NEGATIVE
Candida Glabrata: NEGATIVE
Candida Vaginitis: NEGATIVE
Comment: NEGATIVE
Comment: NEGATIVE
Comment: NEGATIVE

## 2023-05-18 LAB — URINE CULTURE

## 2023-05-18 MED ORDER — CEPHALEXIN 500 MG PO CAPS: 500.0000 mg | ORAL_CAPSULE | Freq: Two times a day (BID) | ORAL | 0 refills | Status: AC

## 2023-05-18 MED ORDER — BUSPIRONE HCL 10 MG PO TABS
ORAL_TABLET | ORAL | 1 refills | Status: DC
Start: 2023-05-18 — End: 2023-09-17

## 2023-05-18 NOTE — Telephone Encounter (Signed)
Pt.notified

## 2023-05-18 NOTE — Telephone Encounter (Signed)
Rx sent 

## 2023-05-18 NOTE — Telephone Encounter (Signed)
Patient is chosen to return to clinic today to give urine sample, sent for culture, prophylactically initiating antibiotic, cephalexin sent to pharmacy, minutes prior culture results, if she continues to have urinary symptoms past use of antibiotic need to be reevaluated by her PCP

## 2023-05-18 NOTE — Telephone Encounter (Signed)
received fax requesting a 90 day supply of the buspirone 10mg  =450 tablets, please write as 2 in the morning and 2 in the evening and 1 at lunch. pt was last seen on 05-04-23 and next appt 08-06-23

## 2023-05-18 NOTE — Telephone Encounter (Signed)
Patient called with questions regarding urine culture, discussed that it showed multiple species and recollection was advised, endorses that she is still experiencing urinary symptoms, discussed with patient that she is able to come back to clinic and give new sample to be resent to culture, endorses that she lives 1 hour away, spoke with patient that she may be prophylactically treated with antibiotic today and that if symptoms still continue to persist that she will need to be reevaluated in person or she may come back to clinic and give sample today, unsure of how she would like to move forward at this time will notify clinic when she is done so

## 2023-05-19 LAB — URINE CULTURE: Culture: NO GROWTH

## 2023-07-05 ENCOUNTER — Ambulatory Visit
Admission: RE | Admit: 2023-07-05 | Discharge: 2023-07-05 | Disposition: A | Discharge status: Home / Self Care | Payer: BC Managed Care – PPO | Source: Ambulatory Visit | Attending: Emergency Medicine | Admitting: Emergency Medicine

## 2023-07-05 VITALS — BP 140/94 | HR 102 | Temp 99.2°F | Resp 18

## 2023-07-05 DIAGNOSIS — J0140 Acute pansinusitis, unspecified: Secondary | ICD-10-CM

## 2023-07-05 LAB — POCT RAPID STREP A (OFFICE): Rapid Strep A Screen: NEGATIVE

## 2023-07-05 MED ORDER — FLUCONAZOLE 150 MG PO TABS: 150.0000 mg | ORAL_TABLET | Freq: Every day | ORAL | 0 refills | Status: AC

## 2023-07-05 MED ORDER — PREDNISONE 10 MG (21) PO TBPK: ORAL_TABLET | Freq: Every day | ORAL | 0 refills | Status: DC

## 2023-07-05 MED ORDER — AMOXICILLIN-POT CLAVULANATE 875-125 MG PO TABS: 1.0000 | ORAL_TABLET | Freq: Two times a day (BID) | ORAL | 0 refills | Status: AC

## 2023-07-05 NOTE — Discharge Instructions (Addendum)
Your symptoms today are most likely being caused by a virus and should steadily improve in time it can take up to 7 to 10 days before you truly start to see a turnaround however things will get better, if no improvement seen by December 12 you may pick up from the pharmacy Augmentin from the pharmacy, will have Diflucan available if needed  In meantime, begin prednisone every morning with food to release inflammation and irritation which ideally will help calm sinus pressure    You can take Tylenol and/or Ibuprofen as needed for fever reduction and pain relief.   For cough: honey 1/2 to 1 teaspoon (you can dilute the honey in water or another fluid).  You can also use guaifenesin and dextromethorphan for cough. You can use a humidifier for chest congestion and cough.  If you don't have a humidifier, you can sit in the bathroom with the hot shower running.      For sore throat: try warm salt water gargles, cepacol lozenges, throat spray, warm tea or water with lemon/honey, popsicles or ice, or OTC cold relief medicine for throat discomfort.   For congestion: take a daily anti-histamine like Zyrtec, Claritin, and a oral decongestant, such as pseudoephedrine.  You can also use Flonase 1-2 sprays in each nostril daily.   It is important to stay hydrated: drink plenty of fluids (water, gatorade/powerade/pedialyte, juices, or teas) to keep your throat moisturized and help further relieve irritation/discomfort.

## 2023-07-08 NOTE — ED Provider Notes (Signed)
[name]    CSN: [id number] Arrival date & time: 07/05/23  1411      History   Chief Complaint Chief Complaint  Patient presents with   Sore Throat    Entered by patient    HPI Colleen Tapia is a 27 y.o. female.   Patient presents for evaluation of subjective fever, nose, body aches sore throat, nasal congestion, rhinorrhea, nonproductive cough and intermittent headaches present for 4 days.  Home COVID test negative.  Tolerating food and liquids.  Has attempted use of Mucinex and Tylenol.  Believes to have sinus infection.  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      Home Medications    Prior to Admission medications   Medication Sig Start Date End Date Taking? Authorizing Provider  amoxicillin-clavulanate (AUGMENTIN) 875-125 MG tablet Take 1 tablet by mouth every 12 (twelve) hours for 10 days. 07/09/23 07/19/23 Yes Apolo Cutshaw, Elita Boone, NP  fluconazole (DIFLUCAN) 150 MG tablet Take 1 tablet (150 mg total) by mouth daily for 2 doses. 07/09/23 07/11/23 Yes Okechukwu Regnier R, NP  predniSONE (STERAPRED UNI-PAK 21 TAB) 10 MG (21) TBPK tablet Take by mouth daily. Take 6 tabs by mouth daily  for 1 days, then 5 tabs for 1 days, then 4 tabs for 1 days, then 3 tabs for 1 days, 2 tabs for 1 days, then 1 tab by mouth daily for 1 days 07/05/23  Yes Kasin Tonkinson,  Surya Folden R, NP  busPIRone (BUSPAR) 10 MG tablet Take 2 tablets (20 mg total) by mouth in the morning and 2 tablets(20 mg total) in the evening and Take 1 tablet (10 mg total) at lunch. 05/18/23   [name], MD  calcitRIOL (ROCALTROL) 0.25 MCG capsule Take 1 capsule (0.25 mcg total) by mouth daily. 02/23/23   Reather Littler, MD  Calcium Carb-Cholecalciferol (CALCIUM PLUS VITAMIN D3 PO) Take 1,200 mg by mouth. Plus 25 mcg Vitamin D3    [provider]  Calcium Carbonate-Vitamin D 600-400 MG-UNIT tablet Take by mouth. 12/30/19   [provider]  fluticasone (FLONASE) 50 MCG/ACT nasal spray Place into the nose. 06/23/15   [provider]  Lactobacillus (AZO COMPLETE FEMININE BALANCE PO) AZO Complete Feminine Balance    [provider]  lamoTRIgine (LAMICTAL) 25 MG tablet Take 1 tablet (25 mg total) by mouth 2 (two) times daily. 05/04/23   [name], MD  levocetirizine (XYZAL) 5 MG tablet Take 5 mg by mouth every evening.    [provider]  levothyroxine (SYNTHROID) 50 MCG tablet TAKE 1 TABLET BY MOUTH EVERY DAY 03/24/23   Reather Littler, MD  lurasidone (LATUDA) 40 MG TABS tablet Take 1 tablet (40 mg total) by mouth daily with supper. 05/04/23   [name], MD  norgestimate-ethinyl estradiol (ORTHO-CYCLEN) 0.25-35 MG-MCG tablet Take 1 tablet by mouth daily. 12/19/20   [provider]  promethazine (PHENERGAN) 25 MG tablet Take 1 tablet (25 mg total) by mouth every 6 (six) hours as needed for nausea or vomiting. Patient not taking: Reported on 02/23/2023 08/14/21   Willy Eddy, MD  traZODone (DESYREL) 100 MG tablet TAKE 1 TABLET BY MOUTH EVERYDAY AT BEDTIME 05/04/23   [name], MD    Family History Family History  Problem Relation Age of Onset   Anxiety disorder Mother    Depression Mother     Social History Social History   Tobacco Use   Smoking status: Never   Smokeless tobacco: Never  Vaping Use   Vaping status:  Never Used  Substance Use Topics   Alcohol use: Yes    Alcohol/week: 3.0 - 5.0 standard drinks of alcohol    Types: 3 - 4 Shots of liquor per week   Drug use: Not Currently     Allergies   No known allergies   Review of Systems Review of Systems   Physical Exam Triage Vital Signs ED Triage Vitals [07/05/23 1432]  Encounter Vitals Group     BP (!) 140/94     Systolic BP Percentile      Diastolic BP Percentile      Pulse Rate (!) 102     Resp 18     Temp 99.2 F (37.3 C)     Temp Source Oral     SpO2 98 %     Weight      Height      Head Circumference      Peak Flow      Pain Score      Pain Loc      Pain Education      Exclude from Growth Chart    No data found.  Updated Vital Signs BP (!) 140/94 (BP Location: Left Arm)   Pulse (!) 102   Temp 99.2 F (37.3 C) (Oral)   Resp 18   SpO2 98%   Visual Acuity Right Eye Distance:   Left Eye Distance:   Bilateral Distance:    Right Eye Near:   Left Eye Near:    Bilateral Near:     Physical Exam Constitutional:      Appearance: Normal appearance.  HENT:     Head: Normocephalic.     Right Ear: Tympanic membrane, ear canal and external ear normal.     Left Ear: Tympanic membrane, ear canal and external ear normal.     Nose: Congestion present. No rhinorrhea.     Right Sinus: Maxillary sinus tenderness and frontal sinus tenderness present.     Left Sinus: Maxillary sinus tenderness and frontal sinus tenderness present.     Mouth/Throat:     Pharynx: No oropharyngeal exudate or posterior oropharyngeal erythema.  Eyes:     Extraocular Movements: Extraocular movements intact.  Cardiovascular:     Rate and Rhythm: Normal rate and regular rhythm.     Pulses: Normal pulses.     Heart sounds: Normal heart sounds.  Pulmonary:     Effort: Pulmonary effort is normal.     Breath sounds: Normal breath sounds.  Neurological:     Mental Status: She is alert and oriented to person, place, and time. Mental status is  at baseline.      UC Treatments / Results  Labs (all labs ordered are listed, but only abnormal results are displayed) Labs Reviewed  POCT RAPID STREP A (OFFICE)    EKG   Radiology No results found.  Procedures Procedures (including critical care time)  Medications Ordered in UC Medications - No data to display  Initial Impression / Assessment and Plan / UC Course  I have reviewed the triage vital signs and the nursing notes.  Pertinent labs & imaging results that were available during my care of the patient were reviewed by me and considered in my medical decision making (see chart for details).  Acute nonrecurrent pansinusitis  Patient is in no signs of distress nor toxic appearing.  Vital signs are stable.  Low suspicion for pneumonia, pneumothorax or bronchitis and therefore will defer imaging.  Rapid strep test negative.  Prescribed prednisone and watch wait antibiotic placed at the pharmacy for day 10 of illness if no improvement seen, Augmentin prescribed and Diflucan as needed.May use additional over-the-counter medications as needed for supportive care.  May follow-up with urgent care as needed if symptoms persist or worsen.  Note given.   Final Clinical Impressions(s) / UC Diagnoses   Final diagnoses:  Acute non-recurrent pansinusitis     Discharge Instructions      Your symptoms today are most likely being caused by a virus and should steadily improve in time it can take up to 7 to 10 days before you truly start to see a turnaround however things will get better, if no improvement seen by December 12 you may pick up from the pharmacy Augmentin from the pharmacy, will have Diflucan available if needed  In meantime, begin prednisone every morning with food to release inflammation and irritation which ideally will help calm sinus pressure    You can take Tylenol and/or Ibuprofen as needed for fever reduction and pain relief.   For cough: honey 1/2 to 1  teaspoon (you can dilute the honey in water or another fluid).  You can also use guaifenesin and dextromethorphan for cough. You can use a humidifier for chest congestion and cough.  If you don't have a humidifier, you can sit in the bathroom with the hot shower running.      For sore throat: try warm salt water gargles, cepacol lozenges, throat spray, warm tea or water with lemon/honey, popsicles or ice, or OTC cold relief medicine for throat discomfort.   For congestion: take a daily anti-histamine like Zyrtec, Claritin, and a oral decongestant, such as pseudoephedrine.  You can also use Flonase 1-2 sprays in each nostril daily.   It is important to stay hydrated: drink plenty of fluids (water, gatorade/powerade/pedialyte, juices, or teas) to keep your throat moisturized and help further relieve irritation/discomfort.    ED Prescriptions     Medication Sig Dispense Auth. Provider   predniSONE (STERAPRED UNI-PAK 21 TAB) 10 MG (21) TBPK tablet Take by mouth daily. Take 6 tabs by mouth daily  for 1 days, then 5 tabs for 1 days, then 4 tabs for 1 days, then 3 tabs for 1 days, 2 tabs for 1 days, then 1 tab by mouth daily for 1 days 21 tablet Dynasty Holquin R, NP   amoxicillin-clavulanate (AUGMENTIN) 875-125 MG tablet Take 1 tablet by mouth every 12 (twelve) hours for 10 days. 20 tablet Eleyna Brugh R, NP   fluconazole (DIFLUCAN) 150 MG tablet Take 1 tablet (150 mg total) by mouth daily for 2 doses. 2 tablet Valinda Hoar, NP      PDMP not reviewed this encounter.   Valinda Hoar, [place] 07/08/23 [phone]

## 2023-08-06 ENCOUNTER — Encounter: Payer: Self-pay | Admitting: Endocrinology

## 2023-08-06 ENCOUNTER — Telehealth (INDEPENDENT_AMBULATORY_CARE_PROVIDER_SITE_OTHER): Payer: BC Managed Care – PPO | Admitting: Child and Adolescent Psychiatry

## 2023-08-06 DIAGNOSIS — F3175 Bipolar disorder, in partial remission, most recent episode depressed: Secondary | ICD-10-CM

## 2023-08-06 DIAGNOSIS — F410 Panic disorder [episodic paroxysmal anxiety] without agoraphobia: Secondary | ICD-10-CM | POA: Diagnosis not present

## 2023-08-06 DIAGNOSIS — F411 Generalized anxiety disorder: Secondary | ICD-10-CM | POA: Diagnosis not present

## 2023-08-06 MED ORDER — LURASIDONE HCL 20 MG PO TABS: 20.0000 mg | ORAL_TABLET | Freq: Every day | ORAL | 1 refills | Status: DC

## 2023-08-06 NOTE — Progress Notes (Signed)
 Virtual Visit via Video Note  I connected with Colleen Tapia on 08/06/23 at 11:00 AM EST by a video enabled telemedicine application and verified that I am speaking with the correct person using two identifiers.  Location: Patient: College Provider: Office   I discussed the limitations of evaluation and management by telemedicine and the availability of in person appointments. The patient expressed understanding and agreed to proceed.  I discussed the assessment and treatment plan with the patient. The patient was provided an opportunity to ask questions and all were answered. The patient agreed with the plan and demonstrated an understanding of the instructions.   The patient was advised to call back or seek an in-person evaluation if the symptoms worsen or if the condition fails to improve as anticipated.  [name], MD     [facility] MD/PA/NP OP Progress Note  08/06/2023 11:22 AM Colleen Tapia  MRN:  [id number]  Chief Complaint: I am doing ok...  HPI: This is a 28 year old Caucasian female with hx of generalized anxiety disorder, MDD, panic attacks previously on Zoloft  and BuSpar  since March 2019 and also tried on Wellbutrin  XL which caused more irritability and therefore discontinued and currently prescribed Latuda  40 mg once a day and Lamictal  25 mg BID for concerns regarding bipolar disorder and BuSpar  for anxiety.   [name] was seen and evaluated over telemedicine encounter for medication management follow-up.  She was present by herself and was evaluated alone.  She reported that she has been doing okay, denied any worsening of anxiety, reported that anxiety has been manageable and stable.  She also denied any high highs or low lows with her mood, reported that mood has been stable overall.  She reported that she has continued to work, work has been stressful but she has been able to manage it okay, she is looking for new jobs based on her M[employer] degree.  She reported that she has  been sleeping and eating well, denied any SI or HI.  She expressed concerns regarding her elevated LDL that was done previously at her primary care's office, and asked if Latuda  can be switched with something like Lexapro  as her mother and her brother takes the medication.  We discussed that Lexapro  and Latuda  has different indications however given her stability with her mood, we can reduce the dose of Latuda  to 20 mg daily and if needed we can increase the dose of Lamictal  after reducing the dose of Latuda .  Discussed pros and cons associated with medication adjustments, she will contact back earlier if she notices any worsening otherwise we will follow up next month.  We also discussed that previously when Latuda  was initiated that she was under different circumstances and had a lot of stressors that most likely had led to anxiety and mood changes.  She will verbalized understanding.     Visit Diagnosis:    ICD-10-CM   1. Generalized anxiety disorder with panic attacks  F41.1    F41.0     2. Bipolar disorder, in partial remission, most recent episode depressed (HCC)  F31.75              Past Psychiatric History: reviewed today, no change.   Past Medical History:  Past Medical History:  Diagnosis Date   Anxiety    Thyroid  disease     Past Surgical History:  Procedure Laterality Date   WISDOM TOOTH EXTRACTION      Family Psychiatric History: As mentioned in initial H&P, reviewed today,  no change  Family History:  Family History  Problem Relation Age of Onset   Anxiety disorder Mother    Depression Mother     Social History:  Social History   Socioeconomic History   Marital status: Single    Spouse name: Not on file   Number of children: 0   Years of education: Not on file   Highest education level: Some college, no degree  Occupational History   Not on file  Tobacco Use   Smoking status: Never   Smokeless tobacco: Never  Vaping Use   Vaping status: Never Used   Substance and Sexual Activity   Alcohol use: Yes    Alcohol/week: 3.0 - 5.0 standard drinks of alcohol    Types: 3 - 4 Shots of liquor per week   Drug use: Not Currently   Sexual activity: Not Currently  Other Topics Concern   Not on file  Social History Narrative   Not on file   Social Drivers of Health   Financial Resource Strain: Low Risk  (04/30/2023)   Received from [facility]   Overall Financial Resource Strain (CARDIA)    Difficulty of Paying Living Expenses: Not hard at all  Food Insecurity: No Food Insecurity (04/30/2023)   Received from [facility]   Hunger Vital Sign    Worried About Running Out of Food in the Last Year: Never true    Ran Out of Food in the Last Year: Never true  Transportation Needs: No Transportation Needs (04/30/2023)   Received from [facility] - Transportation    Lack of Transportation (Medical): No    Lack of Transportation (Non-Medical): No  Physical Activity: Sufficiently Active (06/23/2018)   Exercise Vital Sign    Days of Exercise per Week: 5 days    Minutes of Exercise per Session: 50 min  Stress: Not on file  Social Connections: Unknown (06/23/2018)   Social Connection and Isolation Panel [NHANES]    Frequency of Communication with Friends and Family: Not on file    Frequency of Social Gatherings with Friends and Family: Not on file    Attends Religious Services: More than 4 times per year    Active Member of [employer] or Organizations: Yes    Attends Banker Meetings: More than 4 times per year    Marital Status: Never married    Allergies:  Allergies  Allergen Reactions   No Known Allergies     Metabolic Disorder Labs: No results found for: HGBA1C, MPG Lab Results  Component Value Date   PROLACTIN 13.5 02/01/2020   No results found for: CHOL, TRIG, HDL, CHOLHDL, VLDL, LDLCALC Lab Results  Component Value Date   TSH 2.220 02/16/2023   TSH 2.810 10/02/2022    Therapeutic  Level Labs: No results found for: LITHIUM No results found for: VALPROATE No results found for: CBMZ  Current Medications: Current Outpatient Medications  Medication Sig Dispense Refill   busPIRone  (BUSPAR ) 10 MG tablet Take 2 tablets (20 mg total) by mouth in the morning and 2 tablets(20 mg total) in the evening and Take 1 tablet (10 mg total) at lunch. 450 tablet 1   calcitRIOL  (ROCALTROL ) 0.25 MCG capsule Take 1 capsule (0.25 mcg total) by mouth daily. 90 capsule 3   Calcium Carb-Cholecalciferol (CALCIUM PLUS VITAMIN D3 PO) Take 1,200 mg by mouth. Plus 25 mcg Vitamin D3     Calcium Carbonate-Vitamin D  600-400 MG-UNIT tablet Take by mouth.  fluticasone (FLONASE) 50 MCG/ACT nasal spray Place into the nose.     Lactobacillus (AZO COMPLETE FEMININE BALANCE PO) AZO Complete Feminine Balance     lamoTRIgine  (LAMICTAL ) 25 MG tablet Take 1 tablet (25 mg total) by mouth 2 (two) times daily. 60 tablet 5   levocetirizine (XYZAL) 5 MG tablet Take 5 mg by mouth every evening.     levothyroxine  (SYNTHROID ) 50 MCG tablet TAKE 1 TABLET BY MOUTH EVERY DAY 90 tablet 1   lurasidone  (LATUDA ) 20 MG TABS tablet Take 1 tablet (20 mg total) by mouth daily with supper. 30 tablet 1   norgestimate-ethinyl estradiol (ORTHO-CYCLEN) 0.25-35 MG-MCG tablet Take 1 tablet by mouth daily.     predniSONE  (STERAPRED UNI-PAK 21 TAB) 10 MG (21) TBPK tablet Take by mouth daily. Take 6 tabs by mouth daily  for 1 days, then 5 tabs for 1 days, then 4 tabs for 1 days, then 3 tabs for 1 days, 2 tabs for 1 days, then 1 tab by mouth daily for 1 days 21 tablet 0   promethazine  (PHENERGAN ) 25 MG tablet Take 1 tablet (25 mg total) by mouth every 6 (six) hours as needed for nausea or vomiting. (Patient not taking: Reported on 02/23/2023) 8 tablet 0   traZODone  (DESYREL ) 100 MG tablet TAKE 1 TABLET BY MOUTH EVERYDAY AT BEDTIME 30 tablet 5   Current Facility-Administered Medications  Medication Dose Route Frequency Provider Last Rate  Last Admin   betamethasone  acetate-betamethasone  sodium phosphate (CELESTONE ) injection 3 mg  3 mg Intra-articular Once Evans, Brent M, DPM         Musculoskeletal: Strength & Muscle Tone: unable to assess since visit was over the telemedicine. Gait & Station: unable to assess since visit was over the telemedicine. Patient leans: N/A  Psychiatric Specialty Exam: ROSReview of 12 systems negative except as mentioned in HPI   There were no vitals taken for this visit.There is no height or weight on file to calculate BMI.  General Appearance: Casual and Fairly Groomed  Eye Contact:  Good  Speech:  Clear and Coherent and Normal Rate  Volume:  Normal  Mood:  ok..  Affect:  Appropriate, Congruent, and Restricted  Thought Process:  Goal Directed and Linear  Orientation:  Full (Time, Place, and Person)  Thought Content: Logical   Suicidal Thoughts:  No  Homicidal Thoughts:  No  Memory:  Immediate;   Fair Recent;   Fair Remote;   Fair  Judgement:  Fair  [facility]:  Fair  Psychomotor Activity:  Normal  Concentration:  Concentration: Good and Attention Span: Good  Recall:  Good  Fund of Knowledge: Good  Language: Good  Akathisia:  No    AIMS (if indicated): not done  Assets:  Communication Skills Desire for Improvement Financial Resources/Insurance Housing Leisure Time Physical Health Social Support Talents/Skills Transportation Vocational/Educational  ADL's:  Intact  Cognition: WNL  Sleep:   Good   Screenings: Flowsheet Row ED from 05/16/2023 in [facility]  ED from 02/21/2023 in [facility]  ED from 08/14/2021 in [facility] Department at [facility]  C-SSRS RISK CATEGORY No Risk No Risk No Risk        Assessment and Plan:   28 yo F with hx of anxiety disorder on Zoloft  since March of 2019 and was previously in counseling at [facility].  She is diagnosed with  generalized anxiety disorder with panic attacks and has responded well  to Zoloft  100 mg once a day and BuSpar  10 mg 2 times a day. However recently has noticed more mood fluctuation, irritability, agitation, depressed mood, sleep changes which initially was thought to be consistent with MDD. However Zoloft  and Wellbutrin  both worsened her symptoms, and she continued to experience mood lability, irritability, agitation, depressed mood, sleep changes, recently noted self applying to 100 jobs and there is a strong +ve family of bipolar disorder and therefore considering the dx of Bipolar Disorder.  She was started on Latuda  as pt's mother and brother has responded well to it.   At her appointment in 04/2021, she reported worsening of mood lability, anxiety and sleep in the context of  psychosocial stressors(not getting a job yet, overthinking about her future and relationships). She reported that she was more tearful, sad, has lack of motivation attending school, problems with sleep and energy, which appeared to be most likely in the context of her ongoing psychosocial stressors.   Update on 08/06/23 -she appears to have continued stability with mood and anxiety, due to concerns for metabolic side effects associated with Latuda  and stability with her mood, and after discussing pros and cons of medication adjustment we mutually agreed to reduce the dose of Latuda  to 20 mg daily while continuing rest of the current medications.  She will follow-up again next month or earlier if needed.   Plan as mentioned below.      Plan: Problem 1: Mood(chronic and stable) - Continue with Lamictal  25 mg BID - Decrease Latuda  to 20 mg daily with dinner  - Risks and benefits discussed at the initiation.  - Ind therapy with Russell Pinal at [facility]. - Recommended life style interventions for weight management and mental health.   Problem 2: Generalized Anxiety with Panic attack(chronic andstable) Plan:           -  Continue with Buspar  20 mg BID and take Buspar  10 mg at noon            - Follow up in 12 weeks or early if needed. .   Problem 3: Sleep Plan:  - Taking Trazodone  50 mg at bedtime now and continues to sleep well on the reduced dose.          MDM = 2 or more chronic stable conditions + med management        [name], MD 08/06/2023, 11:22 AM

## 2023-08-07 ENCOUNTER — Other Ambulatory Visit: Payer: Self-pay

## 2023-08-10 ENCOUNTER — Other Ambulatory Visit: Payer: Self-pay

## 2023-08-10 DIAGNOSIS — E201 Pseudohypoparathyroidism: Secondary | ICD-10-CM

## 2023-08-10 DIAGNOSIS — E559 Vitamin D deficiency, unspecified: Secondary | ICD-10-CM

## 2023-08-10 DIAGNOSIS — E038 Other specified hypothyroidism: Secondary | ICD-10-CM

## 2023-08-10 NOTE — Telephone Encounter (Signed)
 Please arrange to check for PTH, BMP with GFR, phosphorus, albumin, vitamin D level, TSH, free T4. Thanks

## 2023-08-10 NOTE — Telephone Encounter (Signed)
 Did want this patient to have lab prior to her appt with on 08/17/23 , please advise

## 2023-08-13 LAB — BASIC METABOLIC PANEL
BUN: 10 mg/dL (ref 7–25)
CO2: 27 mmol/L (ref 20–32)
Calcium: 9.1 mg/dL (ref 8.6–10.2)
Chloride: 103 mmol/L (ref 98–110)
Creat: 0.77 mg/dL (ref 0.50–0.96)
Glucose, Bld: 79 mg/dL (ref 65–99)
Potassium: 4.6 mmol/L (ref 3.5–5.3)
Sodium: 139 mmol/L (ref 135–146)

## 2023-08-13 LAB — T4, FREE: Free T4: 1 ng/dL (ref 0.8–1.8)

## 2023-08-13 LAB — PHOSPHORUS: Phosphorus: 4.2 mg/dL (ref 2.5–4.5)

## 2023-08-13 LAB — VITAMIN D 25 HYDROXY (VIT D DEFICIENCY, FRACTURES): Vit D, 25-Hydroxy: 26 ng/mL — ABNORMAL LOW (ref 30–100)

## 2023-08-13 LAB — ALBUMIN: Albumin: 4.1 g/dL (ref 3.6–5.1)

## 2023-08-13 LAB — PTH, INTACT AND CALCIUM
Calcium: 9.1 mg/dL (ref 8.6–10.2)
PTH: 323 pg/mL — ABNORMAL HIGH (ref 16–77)

## 2023-08-13 LAB — TSH: TSH: 3.17 m[IU]/L

## 2023-08-17 ENCOUNTER — Ambulatory Visit: Payer: BC Managed Care – PPO | Admitting: Endocrinology

## 2023-08-17 ENCOUNTER — Encounter: Payer: Self-pay | Admitting: Endocrinology

## 2023-08-17 ENCOUNTER — Other Ambulatory Visit: Payer: Self-pay | Admitting: Child and Adolescent Psychiatry

## 2023-08-17 VITALS — BP 130/80 | HR 97 | Resp 20 | Ht 65.0 in | Wt 219.6 lb

## 2023-08-17 DIAGNOSIS — E559 Vitamin D deficiency, unspecified: Secondary | ICD-10-CM

## 2023-08-17 DIAGNOSIS — E201 Pseudohypoparathyroidism: Secondary | ICD-10-CM | POA: Diagnosis not present

## 2023-08-17 DIAGNOSIS — E039 Hypothyroidism, unspecified: Secondary | ICD-10-CM | POA: Diagnosis not present

## 2023-08-17 MED ORDER — LEVOTHYROXINE SODIUM 50 MCG PO TABS: 50.0000 ug | ORAL_TABLET | Freq: Every day | ORAL | 3 refills | Status: DC

## 2023-08-17 MED ORDER — CALCITRIOL 0.25 MCG PO CAPS: 0.2500 ug | ORAL_CAPSULE | Freq: Every day | ORAL | 3 refills | Status: DC

## 2023-08-17 NOTE — Progress Notes (Addendum)
Outpatient Endocrinology Note Colleen Parker Sawatzky, MD   Patient's Name: Colleen Tapia    DOB: 1995-10-01    MRN: [id number]  REASON OF VISIT: Follow up for pseudohypoparathyroidism / hypocalcemia / hypothyroidism  PCP: Colleen Ivan, MD  HISTORY OF PRESENT ILLNESS:   Colleen Tapia is a 28 y.o. 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 - She went for routine visit with her PCP and was found to have a calcium was 6.9 in June 2021. She thinks that her calcium probably had been low previously also. Ionized calcium was also low at 3.7, normal >4.5. PTH level 384.  Consistent with pseudohypoparathyroidism.  Patient has family history of pseudohypoparathyroidism in mother and brother. -Patient has been treated with calcitriol, vitamin D3 and calcium supplement. - Her pretreatment symptoms were tightness and cramping in her fingers, at times also a tightness in her face making it difficult to talk.   Calcium levels have been consistently normal with taking CALCITRIOL 0.25 mcg.  Calcitriol was decreased in the past due to high normal serum calcium. She has been continued on calcium supplements, using Os-Cal 1200 mg /with vitamin D3 twice daily.  # Hypothyroidism  -Previously being managed as secondary hypothyroidism, at the time of initial diagnosis free T4 was mildly low at 0.59, no TSH available at that time.   Interval history  Patient has been taking calcitriol 0.25 mcg daily.  She has been taking calcium/vitamin D 1200 mg 2 times a day.  She denies numbness and tingling of the extremities.  She occasionally gets leg cramps.  She has been taking levothyroxine 50 mcg daily.  She had improvement on symptoms of fatigue after being on levothyroxine.  Recent labs reviewed Serum calcium normal 9.1.  Normal  renal function and electrolytes.  PTH is high as expected 323.  Vitamin D is mildly low 26.  It is appropriate to have elevated PTH for pseudohypoparathyroidism.  She reports her vitamin D was 40 in September at [facility].  Thyroid function test normal.   Latest Reference Range & Units 08/12/23 07:27  Sodium 135 - 146 mmol/L 139  Potassium 3.5 - 5.3 mmol/L 4.6  Chloride 98 - 110 mmol/L 103  CO2 20 - 32 mmol/L 27  Glucose 65 - 99 mg/dL 79  BUN 7 - 25 mg/dL 10  Creatinine 2.95 - 6.21 mg/dL 3.08  Calcium 8.6 - 65.7 mg/dL 8.6 - 84.6 mg/dL 9.1 9.1  BUN/Creatinine Ratio 6 - 22 (calc) SEE NOTE:  Phosphorus 2.5 - 4.5 mg/dL 4.2  Vitamin D, 96-EXBMWUX 30 - 100 ng/mL 26 (L)  (L): Data is abnormally low   Latest Reference Range & Units 08/12/23 07:27  Glucose 65 - 99 mg/dL 79  PTH, Intact 16 - 77 pg/mL 323 (H)  TSH mIU/L 3.17  T4,Free(Direct) 0.8 - 1.8 ng/dL 1.0  Albumin MSPROF 3.6 - 5.1 g/dL 4.1  (H): Data is abnormally high   REVIEW OF SYSTEMS:  As per history of present illness.   PAST MEDICAL HISTORY: Past Medical History:  Diagnosis Date   Anxiety    Thyroid disease     PAST SURGICAL HISTORY: Past Surgical History:  Procedure Laterality Date   WISDOM TOOTH EXTRACTION      ALLERGIES: Allergies  Allergen Reactions   No Known Allergies     FAMILY HISTORY:  Family History  Problem Relation Age of Onset   Anxiety disorder Mother    Depression Mother    No known family history of hypercalcemia, kidney stone, hyperparathyroidism, or endocrine neoplasms.   SOCIAL HISTORY: Social History   Socioeconomic History   Marital status: Single    Spouse name: Not on file   Number of children: 0   Years of education: Not on file   Highest education level: Some college, no degree  Occupational History   Not on file  Tobacco Use   Smoking status: Never   Smokeless tobacco: Never  Vaping Use   Vaping status: Never Used  Substance and Sexual Activity   Alcohol use: Yes     Alcohol/week: 3.0 - 5.0 standard drinks of alcohol    Types: 3 - 4 Shots of liquor per week   Drug use: Not Currently   Sexual activity: Not Currently  Other Topics Concern   Not on file  Social History Narrative   Not on file   Social Drivers of Health   Financial Resource Strain: Low Risk  (04/30/2023)   Received from [facility]   Overall Financial Resource Strain (CARDIA)    Difficulty of Paying Living Expenses: Not hard at all  Food Insecurity: No Food Insecurity (04/30/2023)   Received from [facility]   Hunger Vital Sign    Worried About Running Out of Food in the Last Year: Never true    Ran Out of Food in the Last Year: Never true  Transportation Needs: No Transportation Needs (04/30/2023)   Received from [facility] - Transportation    Lack of Transportation (Medical): No    Lack of Transportation (Non-Medical): No  Physical Activity: Sufficiently Active (06/23/2018)   Exercise Vital Sign    Days of Exercise per Week: 5 days    Minutes of Exercise per Session: 50 min  Stress: Not on file  Social Connections: Unknown (06/23/2018)   Social Connection and Isolation Panel [NHANES]    Frequency of Communication with Friends and Family: Not on file    Frequency of Social Gatherings with Friends and Family: Not on file    Attends Religious Services: More than 4 times per year    Active Member of [employer] or Organizations: Yes    Attends Engineer, structural: More than 4 times per year    Marital Status: Never married    MEDICATIONS:  Current Outpatient Medications  Medication Sig Dispense Refill   busPIRone (BUSPAR) 10 MG tablet Take 2 tablets (20 mg total) by mouth in the morning and 2 tablets(20 mg total) in the evening and Take 1 tablet (10 mg total) at lunch. 450 tablet 1   Calcium Carb-Cholecalciferol (CALCIUM PLUS VITAMIN D3 PO) Take 1,200 mg by mouth. Plus 25 mcg Vitamin D3     fluticasone (FLONASE) 50 MCG/ACT nasal spray Place into the  nose.     Lactobacillus (AZO COMPLETE FEMININE BALANCE PO) AZO Complete Feminine Balance     lamoTRIgine (LAMICTAL) 25 MG tablet Take 1 tablet (25 mg total) by mouth 2 (two) times daily. 60 tablet 5   levocetirizine (XYZAL) 5 MG tablet Take 5 mg by mouth every evening.     lurasidone (LATUDA) 20 MG TABS tablet Take 1 tablet (20 mg total) by mouth daily with supper. 30 tablet 1   norgestimate-ethinyl estradiol (ORTHO-CYCLEN) 0.25-35 MG-MCG tablet Take 1 tablet by mouth daily.     traZODone (DESYREL) 100 MG tablet TAKE 1 TABLET BY  MOUTH EVERYDAY AT BEDTIME 30 tablet 5   calcitRIOL (ROCALTROL) 0.25 MCG capsule Take 1 capsule (0.25 mcg total) by mouth daily. 90 capsule 3   Calcium Carbonate-Vitamin D 600-400 MG-UNIT tablet Take by mouth.     levothyroxine (SYNTHROID) 50 MCG tablet Take 1 tablet (50 mcg total) by mouth daily. 90 tablet 3   predniSONE (STERAPRED UNI-PAK 21 TAB) 10 MG (21) TBPK tablet Take by mouth daily. Take 6 tabs by mouth daily  for 1 days, then 5 tabs for 1 days, then 4 tabs for 1 days, then 3 tabs for 1 days, 2 tabs for 1 days, then 1 tab by mouth daily for 1 days 21 tablet 0   promethazine (PHENERGAN) 25 MG tablet Take 1 tablet (25 mg total) by mouth every 6 (six) hours as needed for nausea or vomiting. (Patient not taking: Reported on 02/23/2023) 8 tablet 0   Current Facility-Administered Medications  Medication Dose Route Frequency Provider Last Rate Last Admin   betamethasone acetate-betamethasone sodium phosphate (CELESTONE) injection 3 mg  3 mg Intra-articular Once Felecia Shelling, DPM        PHYSICAL EXAM: Vitals:   08/17/23 0919  BP: 130/80  Pulse: 97  Resp: 20  SpO2: 98%  Weight: 219 lb 9.6 oz (99.6 kg)  Height: 5\' 5"  (1.651 m)   Body mass index is 36.54 kg/m.    General: Well developed, well nourished female in no apparent distress.  HEENT: AT/[place], no external lesions. Hearing intact to the spoken word Eyes: Conjunctiva clear and no icterus. Neck: Trachea  midline, neck supple  Lungs: Clear to auscultation, no wheeze. Respirations not labored Heart: S1S2, Regular in rate and rhythm.  Abdomen: Soft, non tender, non distended Neurologic: Alert, oriented, normal speech, deep tendon biceps reflexes normal,  no gross focal neurological deficit Extremities: No pedal pitting edema, no tremors of outstretched hands.  Chovstek's sign negative. Skin: Warm, color good. Psychiatric: Does not appear depressed or anxious  PERTINENT HISTORIC LABORATORY AND IMAGING STUDIES:  All pertinent laboratory results were reviewed. Please see HPI also for further details.   ASSESSMENT / PLAN  1. Pseudohypoparathyroidism   2. Hypothyroidism, unspecified type   3. Vitamin D deficiency    # Pseudohypoparathyroidism : -She has mild baseline symptoms of tightness in her hands and face and cramping.  Recent serum calcium normal.  # Hypothyroidism ?  Primary likely. -Recent thyroid function test normal.  Currently taking levothyroxine 50 mcg daily.  Plan: -Continue current dose of calcitriol 0.25 mcg daily. -Continue calcium/vitamin D 200 mg 2 times a day.  Advised to take Tums as needed for symptoms of hypocalcemia. -Continue levothyroxine 50 mcg daily. -Endocrinology follow-up in 6 months.  Patient will complete lab prior to follow-up visit locally.  # Vitamin D deficiency : Level 26.  Patient reports she had level of 40 in September.  Advised to take vitamin D3 1000 international unit over-the-counter daily for winter months.   Diagnoses and all orders for this visit:  Pseudohypoparathyroidism -     Renal function panel; Future  Hypothyroidism, unspecified type -     levothyroxine (SYNTHROID) 50 MCG tablet; Take 1 tablet (50 mcg total) by mouth daily.  Vitamin D deficiency  Other orders -     calcitRIOL (ROCALTROL) 0.25 MCG capsule; Take 1 capsule (0.25 mcg total) by mouth daily.    DISPOSITION Follow up in clinic in 6 months suggested.  All  questions answered and patient verbalized understanding of the plan.  Colleen Danyal Adorno,  MD [facility] Endocrinology [facility] [address] [place], [address] [place], [place] [address] Phone # [phone]  At least part of this note was generated using voice recognition software. Inadvertent word errors may have occurred, which were not recognized during the proofreading process.

## 2023-08-24 ENCOUNTER — Ambulatory Visit: Payer: BC Managed Care – PPO | Admitting: Endocrinology

## 2023-09-17 ENCOUNTER — Telehealth (INDEPENDENT_AMBULATORY_CARE_PROVIDER_SITE_OTHER): Payer: BC Managed Care – PPO | Admitting: Child and Adolescent Psychiatry

## 2023-09-17 DIAGNOSIS — F3175 Bipolar disorder, in partial remission, most recent episode depressed: Secondary | ICD-10-CM | POA: Diagnosis not present

## 2023-09-17 DIAGNOSIS — F411 Generalized anxiety disorder: Secondary | ICD-10-CM

## 2023-09-17 DIAGNOSIS — F410 Panic disorder [episodic paroxysmal anxiety] without agoraphobia: Secondary | ICD-10-CM | POA: Diagnosis not present

## 2023-09-17 MED ORDER — LAMOTRIGINE 25 MG PO TABS
25.0000 mg | ORAL_TABLET | Freq: Two times a day (BID) | ORAL | 5 refills | Status: DC
Start: 2023-09-17 — End: 2023-12-23

## 2023-09-17 MED ORDER — BUSPIRONE HCL 10 MG PO TABS
ORAL_TABLET | ORAL | 1 refills | Status: DC
Start: 2023-09-17 — End: 2023-12-23

## 2023-09-17 MED ORDER — TRAZODONE HCL 50 MG PO TABS: 50.0000 mg | ORAL_TABLET | Freq: Every day | ORAL | 1 refills | Status: DC

## 2023-09-17 MED ORDER — LURASIDONE HCL 20 MG PO TABS: 20.0000 mg | ORAL_TABLET | Freq: Every day | ORAL | 1 refills | Status: DC

## 2023-09-17 NOTE — Progress Notes (Signed)
 Virtual Visit via Video Note  I connected with Colleen Tapia on 09/17/23 at 11:30 AM EST by a video enabled telemedicine application and verified that I am speaking with the correct person using two identifiers.  Location: Patient: College Provider: Office   I discussed the limitations of evaluation and management by telemedicine and the availability of in person appointments. The patient expressed understanding and agreed to proceed.  I discussed the assessment and treatment plan with the patient. The patient was provided an opportunity to ask questions and all were answered. The patient agreed with the plan and demonstrated an understanding of the instructions.   The patient was advised to call back or seek an in-person evaluation if the symptoms worsen or if the condition fails to improve as anticipated.  [name], MD     [facility] MD/PA/NP OP Progress Note  09/17/2023 12:00 PM Colleen Tapia  MRN:  [id number]  Chief Complaint: "I am doing good.."    HPI: This is a 28 year old Caucasian female with hx of generalized anxiety disorder, MDD, panic attacks previously on Zoloft and BuSpar since March 2019 and also tried on Wellbutrin XL which caused more irritability and therefore discontinued and currently prescribed Latuda 20 mg once a day and Lamictal 25 mg BID for concerns regarding bipolar disorder and BuSpar for anxiety.   Allicia was seen and evaluated over telemedicine encounter for medication management follow-up.  She was present by herself and was evaluated alone.  At her last appointment she was recommended to decrease the dose of Latuda to 20 mg daily due to concerns for metabolic side effects.  Today she reported that after decreasing the dose, she has in fact felt better, her mood has been much better, she is more happier, less irritable and doing well overall.  She denied excessive worries or anxiety, except having some occasional anxiety.  She denied problems with sleep,  appetite, denied SI or HI.  She reported that she has consistently been taking her medications as prescribed.  In her free time she hangs out with her friend and tries to stay active, has been going to gym about 3-4 times a week.  She is also working, does not like her work but is able to get through it and looking for more suitable job.  We discussed to continue with current medications because of the stability with her symptoms and plan to reduce Latuda further if she continues to have stability.  She verbalized understanding and agreed with this plan.  She will follow-up in about 3 months or earlier if needed.  Visit Diagnosis:    ICD-10-CM   1. Bipolar disorder, in partial remission, most recent episode depressed (HCC)  F31.75 lamoTRIgine (LAMICTAL) 25 MG tablet    2. Generalized anxiety disorder with panic attacks  F41.1 busPIRone (BUSPAR) 10 MG tablet   F41.0               Past Psychiatric History: reviewed today, no change.   Past Medical History:  Past Medical History:  Diagnosis Date   Anxiety    Thyroid disease     Past Surgical History:  Procedure Laterality Date   WISDOM TOOTH EXTRACTION      Family Psychiatric History: As mentioned in initial H&P, reviewed today, no change  Family History:  Family History  Problem Relation Age of Onset   Anxiety disorder Mother    Depression Mother     Social History:  Social History   Socioeconomic History  Marital status: Single    Spouse name: Not on file   Number of children: 0   Years of education: Not on file   Highest education level: Some college, no degree  Occupational History   Not on file  Tobacco Use   Smoking status: Never   Smokeless tobacco: Never  Vaping Use   Vaping status: Never Used  Substance and Sexual Activity   Alcohol use: Yes    Alcohol/week: 3.0 - 5.0 standard drinks of alcohol    Types: 3 - 4 Shots of liquor per week   Drug use: Not Currently   Sexual activity: Not Currently   Other Topics Concern   Not on file  Social History Narrative   Not on file   Social Drivers of Health   Financial Resource Strain: Low Risk  (04/30/2023)   Received from [facility]   Overall Financial Resource Strain (CARDIA)    Difficulty of Paying Living Expenses: Not hard at all  Food Insecurity: No Food Insecurity (04/30/2023)   Received from [facility]   Hunger Vital Sign    Worried About Running Out of Food in the Last Year: Never true    Ran Out of Food in the Last Year: Never true  Transportation Needs: No Transportation Needs (04/30/2023)   [facility]    Lack of Transportation (Medical): No    Lack of Transportation (Non-Medical): No  Physical Activity: Sufficiently Active (06/23/2018)   Exercise Vital Sign    Days of Exercise per Week: 5 days    Minutes of Exercise per Session: 50 min  Stress: Not on file  Social Connections: Unknown (06/23/2018)   Social Connection and Isolation Panel [NHANES]    Frequency of Communication with Friends and Family: Not on file    Frequency of Social Gatherings with Friends and Family: Not on file    Attends Religious Services: More than 4 times per year    Active Member of [employer] or Organizations: Yes    Attends Banker Meetings: More than 4 times per year    Marital Status: Never married    Allergies:  Allergies  Allergen Reactions   No Known Allergies     Metabolic Disorder Labs: No results found for: "HGBA1C", "MPG" Lab Results  Component Value Date   PROLACTIN 13.5 02/01/2020   No results found for: "CHOL", "TRIG", "HDL", "CHOLHDL", "VLDL", "LDLCALC" Lab Results  Component Value Date   TSH 3.17 08/12/2023   TSH 2.220 02/16/2023    Therapeutic Level Labs: No results found for: "LITHIUM" No results found for: "VALPROATE" No results found for: "CBMZ"  Current Medications: Current Outpatient Medications  Medication Sig Dispense Refill    busPIRone (BUSPAR) 10 MG tablet Take 2 tablets (20 mg total) by mouth in the morning and 2 tablets(20 mg total) in the evening and Take 1 tablet (10 mg total) at lunch. 450 tablet 1   calcitRIOL (ROCALTROL) 0.25 MCG capsule Take 1 capsule (0.25 mcg total) by mouth daily. 90 capsule 3   Calcium Carb-Cholecalciferol (CALCIUM PLUS VITAMIN D3 PO) Take 1,200 mg by mouth. Plus 25 mcg Vitamin D3     Calcium Carbonate-Vitamin D 600-400 MG-UNIT tablet Take by mouth.     fluticasone (FLONASE) 50 MCG/ACT nasal spray Place into the nose.     Lactobacillus (AZO COMPLETE FEMININE BALANCE PO) AZO Complete Feminine Balance     lamoTRIgine (LAMICTAL) 25 MG tablet Take 1 tablet (25 mg  total) by mouth 2 (two) times daily. 60 tablet 5   levocetirizine (XYZAL) 5 MG tablet Take 5 mg by mouth every evening.     levothyroxine (SYNTHROID) 50 MCG tablet Take 1 tablet (50 mcg total) by mouth daily. 90 tablet 3   lurasidone (LATUDA) 20 MG TABS tablet Take 1 tablet (20 mg total) by mouth daily with supper. 90 tablet 1   norgestimate-ethinyl estradiol (ORTHO-CYCLEN) 0.25-35 MG-MCG tablet Take 1 tablet by mouth daily.     predniSONE (STERAPRED UNI-PAK 21 TAB) 10 MG (21) TBPK tablet Take by mouth daily. Take 6 tabs by mouth daily  for 1 days, then 5 tabs for 1 days, then 4 tabs for 1 days, then 3 tabs for 1 days, 2 tabs for 1 days, then 1 tab by mouth daily for 1 days 21 tablet 0   promethazine (PHENERGAN) 25 MG tablet Take 1 tablet (25 mg total) by mouth every 6 (six) hours as needed for nausea or vomiting. (Patient not taking: Reported on 02/23/2023) 8 tablet 0   traZODone (DESYREL) 50 MG tablet Take 1 tablet (50 mg total) by mouth at bedtime. 90 tablet 1   Current Facility-Administered Medications  Medication Dose Route Frequency Provider Last Rate Last Admin   betamethasone acetate-betamethasone sodium phosphate (CELESTONE) injection 3 mg  3 mg Intra-articular Once Felecia Shelling, DPM         Musculoskeletal: Strength &  Muscle Tone: unable to assess since visit was over the telemedicine. Gait & Station: unable to assess since visit was over the telemedicine. Patient leans: N/A  Psychiatric Specialty Exam: ROSReview of 12 systems negative except as mentioned in HPI   There were no vitals taken for this visit.There is no height or weight on file to calculate BMI.  General Appearance: Casual and Fairly Groomed  Eye Contact:  Good  Speech:  Clear and Coherent and Normal Rate  Volume:  Normal  Mood:  "ok.."  Affect:  Appropriate, Congruent, and Restricted  Thought Process:  Goal Directed and Linear  Orientation:  Full (Time, Place, and Person)  Thought Content: Logical   Suicidal Thoughts:  No  Homicidal Thoughts:  No  Memory:  Immediate;   Fair Recent;   Fair Remote;   Fair  Judgement:  Fair  [facility]:  Fair  Psychomotor Activity:  Normal  Concentration:  Concentration: Good and Attention Span: Good  Recall:  Good  Fund of Knowledge: Good  Language: Good  Akathisia:  No    AIMS (if indicated): not done  Assets:  Communication Skills Desire for Improvement Financial Resources/Insurance Housing Leisure Time Physical Health Social Support Talents/Skills Transportation Vocational/Educational  ADL's:  Intact  Cognition: WNL  Sleep:   Good   Screenings: Flowsheet Row ED from 05/16/2023 in [facility]  ED from 02/21/2023 in [facility] at [facility]  ED from 08/14/2021 in [facility]  C-SSRS RISK CATEGORY No Risk No Risk No Risk        Assessment and Plan:   28 yo F with hx of anxiety disorder on Zoloft since March of 2019 and was previously in counseling at [facility].  She is diagnosed with generalized anxiety disorder with panic attacks and has responded well to Zoloft 100 mg once a day and BuSpar 10 mg 2 times a day. However recently has noticed more mood fluctuation,  irritability, agitation, depressed mood, sleep changes which initially was thought to be  consistent with MDD. However Zoloft and Wellbutrin both worsened her symptoms, and she continued to experience mood lability, irritability, agitation, depressed mood, sleep changes, recently noted self applying to 100 jobs and there is a strong +ve family of bipolar disorder and therefore considering the dx of Bipolar Disorder.  She was started on Latuda as pt's mother and brother has responded well to it.   At her appointment in 04/2021, she reported worsening of mood lability, anxiety and sleep in the context of  psychosocial stressors(not getting a job yet, overthinking about her future and relationships). She reported that she was more tearful, sad, has lack of motivation attending school, problems with sleep and energy, which appeared to be most likely in the context of her ongoing psychosocial stressors.   Update on 09/17/23 -after decreasing Latuda, she appears to have improvement with her mood, anxiety has remained stable, therefore recommending to continue with current medications with plan to discontinue Latuda if she continues to do well over the next few months.     Plan as mentioned below.      Plan: Problem 1: Mood(chronic and stable) - Continue with Lamictal 25 mg BID - Continue with Latuda 20 mg daily with dinner  - Risks and benefits discussed at the initiation.  - Ind therapy with Shara Blazing at [facility]. - Recommended life style interventions for weight management and mental health.   Problem 2: Generalized Anxiety with Panic attack(chronic andstable) Plan:           - Continue with Buspar 20 mg BID and take Buspar 10 mg at noon   Problem 3: Sleep Plan:  - Taking Trazodone 50 mg at bedtime now and continues to sleep well on the reduced dose.                 [name], MD 09/17/2023, 12:00 PM

## 2023-12-23 ENCOUNTER — Telehealth (INDEPENDENT_AMBULATORY_CARE_PROVIDER_SITE_OTHER): Payer: Self-pay | Admitting: Child and Adolescent Psychiatry

## 2023-12-23 DIAGNOSIS — F410 Panic disorder [episodic paroxysmal anxiety] without agoraphobia: Secondary | ICD-10-CM

## 2023-12-23 DIAGNOSIS — F411 Generalized anxiety disorder: Secondary | ICD-10-CM | POA: Diagnosis not present

## 2023-12-23 DIAGNOSIS — F3175 Bipolar disorder, in partial remission, most recent episode depressed: Secondary | ICD-10-CM | POA: Diagnosis not present

## 2023-12-23 MED ORDER — BUSPIRONE HCL 10 MG PO TABS: ORAL_TABLET | ORAL | 1 refills | Status: DC

## 2023-12-23 MED ORDER — LAMOTRIGINE 25 MG PO TABS: 25.0000 mg | ORAL_TABLET | Freq: Two times a day (BID) | ORAL | 5 refills | Status: AC

## 2023-12-23 NOTE — Progress Notes (Signed)
 Virtual Visit via Video Note  I connected with Colleen Tapia on 12/23/23 at 11:00 AM EDT by a video enabled telemedicine application and verified that I am speaking with the correct person using two identifiers.  Location: Patient: Work Provider: Office   I discussed the limitations of evaluation and management by telemedicine and the availability of in person appointments. The patient expressed understanding and agreed to proceed.  I discussed the assessment and treatment plan with the patient. The patient was provided an opportunity to ask questions and all were answered. The patient agreed with the plan and demonstrated an understanding of the instructions.   The patient was advised to call back or seek an in-person evaluation if the symptoms worsen or if the condition fails to improve as anticipated.  Pilar Bridge, MD     [facility] MD/PA/NP OP Progress Note  12/23/2023 11:18 AM Colleen Tapia  MRN:  [id number]  Chief Complaint: "doing pretty good..."  HPI: This is a 28 year old Caucasian female with hx of generalized anxiety disorder, MDD, panic attacks previously on Zoloft  and BuSpar  since March 2019 and also tried on Wellbutrin  XL which caused more irritability and therefore discontinued and currently prescribed Latuda  20 mg once a day and Lamictal  25 mg BID for concerns regarding bipolar disorder and BuSpar  for anxiety.   Colleen Tapia was seen and evaluated over telemedicine encounter for medication management follow-up.  She was present by herself and was evaluated alone.  She denied any new concerns for today's appointment and reported that she has been doing "pretty good".  She reported that her primary care physician has started her on Zepbound and she has lost about 10 pounds in 7 weeks, without any side effects.  She has not noticed any changes with her mood or anxiety, reported that her mood has been "stable", denied any excessive worries or anxiety and rated her anxiety around 3 or  4 out of 10, 10 being most anxious.  She denied any problems with sleep, takes trazodone  but believes that she does not need to take it.  We discussed to try trazodone  25 mg as needed and if she sleeps well on 25, she can try discontinuing it.  Because of her stability with her mood over the time, we also discussed to discontinue Latuda  for now and reassess.  We discussed risks associated with discontinuing Latuda  including but not limited to worsening of mood, and if she notices any worsening of her mood, she will restart back 20 mg daily and will call for message this Clinical research associate.  She verbalized understanding.  She denied SI or HI, denied any substance abuse.  She reported that she is doing well at work, hangs out with her friends in her free time.  She will follow-up again in 3 months or earlier if needed.  Visit Diagnosis:    ICD-10-CM   1. Bipolar disorder, in partial remission, most recent episode depressed (HCC)  F31.75 lamoTRIgine  (LAMICTAL ) 25 MG tablet    2. Generalized anxiety disorder with panic attacks  F41.1 busPIRone  (BUSPAR ) 10 MG tablet   F41.0                Past Psychiatric History: reviewed today, no change.   Past Medical History:  Past Medical History:  Diagnosis Date   Anxiety    Thyroid  disease     Past Surgical History:  Procedure Laterality Date   WISDOM TOOTH EXTRACTION      Family Psychiatric History: As mentioned in initial H&P,  reviewed today, no change  Family History:  Family History  Problem Relation Age of Onset   Anxiety disorder Mother    Depression Mother     Social History:  Social History   Socioeconomic History   Marital status: Single    Spouse name: Not on file   Number of children: 0   Years of education: Not on file   Highest education level: Some college, no degree  Occupational History   Not on file  Tobacco Use   Smoking status: Never   Smokeless tobacco: Never  Vaping Use   Vaping status: Never Used  Substance and  Sexual Activity   Alcohol use: Yes    Alcohol/week: 3.0 - 5.0 standard drinks of alcohol    Types: 3 - 4 Shots of liquor per week   Drug use: Not Currently   Sexual activity: Not Currently  Other Topics Concern   Not on file  Social History Narrative   Not on file   Social Drivers of Health   Financial Resource Strain: Low Risk  (04/30/2023)   Received from [facility]   Overall Financial Resource Strain (CARDIA)    Difficulty of Paying Living Expenses: Not hard at all  Food Insecurity: No Food Insecurity (10/26/2023)   Received from [facility]   Hunger Vital Sign    Worried About Running Out of Food in the Last Year: Never true    Ran Out of Food in the Last Year: Never true  Transportation Needs: No Transportation Needs (10/26/2023)   Received from [facility] - Transportation    Lack of Transportation (Medical): No    Lack of Transportation (Non-Medical): No  Physical Activity: Sufficiently Active (06/23/2018)   Exercise Vital Sign    Days of Exercise per Week: 5 days    Minutes of Exercise per Session: 50 min  Stress: Not on file  Social Connections: Unknown (06/23/2018)   Social Connection and Isolation Panel [NHANES]    Frequency of Communication with Friends and Family: Not on file    Frequency of Social Gatherings with Friends and Family: Not on file    Attends Religious Services: More than 4 times per year    Active Member of [employer] or Organizations: Yes    Attends Banker Meetings: More than 4 times per year    Marital Status: Never married    Allergies:  Allergies  Allergen Reactions   No Known Allergies     Metabolic Disorder Labs: No results found for: "HGBA1C", "MPG" Lab Results  Component Value Date   PROLACTIN 13.5 02/01/2020   No results found for: "CHOL", "TRIG", "HDL", "CHOLHDL", "VLDL", "LDLCALC" Lab Results  Component Value Date   TSH 3.17 08/12/2023   TSH 2.220 02/16/2023    Therapeutic Level Labs: No  results found for: "LITHIUM" No results found for: "VALPROATE" No results found for: "CBMZ"  Current Medications: Current Outpatient Medications  Medication Sig Dispense Refill   busPIRone  (BUSPAR ) 10 MG tablet Take 2 tablets (20 mg total) by mouth in the morning and 2 tablets(20 mg total) in the evening and Take 1 tablet (10 mg total) at lunch. 450 tablet 1   calcitRIOL  (ROCALTROL ) 0.25 MCG capsule Take 1 capsule (0.25 mcg total) by mouth daily. 90 capsule 3   Calcium Carb-Cholecalciferol (CALCIUM PLUS VITAMIN D3 PO) Take 1,200 mg by mouth. Plus 25 mcg Vitamin D3     Calcium Carbonate-Vitamin D  600-400 MG-UNIT tablet Take by mouth.  fluticasone (FLONASE) 50 MCG/ACT nasal spray Place into the nose.     Lactobacillus (AZO COMPLETE FEMININE BALANCE PO) AZO Complete Feminine Balance     lamoTRIgine  (LAMICTAL ) 25 MG tablet Take 1 tablet (25 mg total) by mouth 2 (two) times daily. 60 tablet 5   levocetirizine (XYZAL) 5 MG tablet Take 5 mg by mouth every evening.     levothyroxine  (SYNTHROID ) 50 MCG tablet Take 1 tablet (50 mcg total) by mouth daily. 90 tablet 3   lurasidone  (LATUDA ) 20 MG TABS tablet Take 1 tablet (20 mg total) by mouth daily with supper. 90 tablet 1   norgestimate-ethinyl estradiol (ORTHO-CYCLEN) 0.25-35 MG-MCG tablet Take 1 tablet by mouth daily.     predniSONE  (STERAPRED UNI-PAK 21 TAB) 10 MG (21) TBPK tablet Take by mouth daily. Take 6 tabs by mouth daily  for 1 days, then 5 tabs for 1 days, then 4 tabs for 1 days, then 3 tabs for 1 days, 2 tabs for 1 days, then 1 tab by mouth daily for 1 days 21 tablet 0   promethazine  (PHENERGAN ) 25 MG tablet Take 1 tablet (25 mg total) by mouth every 6 (six) hours as needed for nausea or vomiting. (Patient not taking: Reported on 02/23/2023) 8 tablet 0   traZODone  (DESYREL ) 50 MG tablet Take 1 tablet (50 mg total) by mouth at bedtime. 90 tablet 1   Current Facility-Administered Medications  Medication Dose Route Frequency Provider Last  Rate Last Admin   betamethasone  acetate-betamethasone  sodium phosphate (CELESTONE ) injection 3 mg  3 mg Intra-articular Once Evans, Brent M, DPM         Musculoskeletal: Strength & Muscle Tone: unable to assess since visit was over the telemedicine. Gait & Station: unable to assess since visit was over the telemedicine. Patient leans: N/A  Psychiatric Specialty Exam: ROSReview of 12 systems negative except as mentioned in HPI   There were no vitals taken for this visit.There is no height or weight on file to calculate BMI.  General Appearance: Casual and Fairly Groomed  Eye Contact:  Good  Speech:  Clear and Coherent and Normal Rate  Volume:  Normal  Mood:  "ok.."  Affect:  Appropriate, Congruent, and Restricted  Thought Process:  Goal Directed and Linear  Orientation:  Full (Time, Place, and Person)  Thought Content: Logical   Suicidal Thoughts:  No  Homicidal Thoughts:  No  Memory:  Immediate;   Fair Recent;   Fair Remote;   Fair  Judgement:  Fair  [facility]:  Fair  Psychomotor Activity:  Normal  Concentration:  Concentration: Good and Attention Span: Good  Recall:  Good  Fund of Knowledge: Good  Language: Good  Akathisia:  No    AIMS (if indicated): not done  Assets:  Communication Skills Desire for Improvement Financial Resources/Insurance Housing Leisure Time Physical Health Social Support Talents/Skills Transportation Vocational/Educational  ADL's:  Intact  Cognition: WNL  Sleep:   Good   Screenings: Flowsheet Row UC from 05/16/2023 in [facility] [place]  UC from 02/21/2023 in [facility]  ED from 08/14/2021 in [facility] at [facility]  C-SSRS RISK CATEGORY No Risk No Risk No Risk        Assessment and Plan:   28 yo F with hx of anxiety disorder on Zoloft  since March of 2019 and was previously in counseling at [facility].  She is diagnosed with  generalized anxiety disorder with panic attacks  and has responded well to Zoloft  100 mg once a day and BuSpar  10 mg 2 times a day. However recently has noticed more mood fluctuation, irritability, agitation, depressed mood, sleep changes which initially was thought to be consistent with MDD. However Zoloft  and Wellbutrin  both worsened her symptoms, and she continued to experience mood lability, irritability, agitation, depressed mood, sleep changes, recently noted self applying to 100 jobs and there is a strong +ve family of bipolar disorder and therefore considering the dx of Bipolar Disorder.  She was started on Latuda  as pt's mother and brother has responded well to it.   At her appointment in 04/2021, she reported worsening of mood lability, anxiety and sleep in the context of  psychosocial stressors(not getting a job yet, overthinking about her future and relationships). She reported that she was more tearful, sad, has lack of motivation attending school, problems with sleep and energy, which appeared to be most likely in the context of her ongoing psychosocial stressors.   Update on 12/23/23 -reviewed results her current medications and she appears to have continued stability with her mood and anxiety, we mutually agreed to discontinue Latuda  because of the stability with her mood, while continuing lamotrigine , BuSpar  and trazodone  as needed.  She will follow-up again in about 3 months or earlier if needed.     Plan as mentioned below.      Plan: Problem 1: Mood(chronic and stable) - Continue with Lamictal  25 mg BID - Discontinue Latuda  20 mg daily with dinner   - Ind therapy with Tori Freer at [facility]. - Recommended life style interventions for weight management and mental health.   Problem 2: Generalized Anxiety with Panic attack(chronic andstable) Plan:           - Continue with Buspar  20 mg BID and take Buspar  10 mg at noon   Problem 3: Sleep Plan:  - Taking Trazodone  50 mg at  bedtime now and continues to sleep well, recommending to try 25 mg at bedtime and discontinue if she continues to sleep well.                  Pilar Bridge, MD 12/23/2023, 11:18 AM

## 2024-01-21 ENCOUNTER — Telehealth: Payer: Self-pay

## 2024-01-21 DIAGNOSIS — F3175 Bipolar disorder, in partial remission, most recent episode depressed: Secondary | ICD-10-CM

## 2024-01-21 NOTE — Telephone Encounter (Signed)
 Patient called stating that at her last visit her Latuda  was discontinued she is experiencing mood swings and her anxiety is worse she is not sure if she needs to restart the Latuda  or if the Lamictal  dosage needs to be changed. This has been going on since a week after discontinuing the medication please advise.

## 2024-01-25 ENCOUNTER — Telehealth: Payer: Self-pay | Admitting: Child and Adolescent Psychiatry

## 2024-01-25 ENCOUNTER — Telehealth: Payer: Self-pay

## 2024-01-25 NOTE — Telephone Encounter (Signed)
 Called patient to inform her that she could start the Latuda  and advise to call the office if symptoms did not improve

## 2024-01-25 NOTE — Telephone Encounter (Signed)
 Val just spoke with her. Thanks

## 2024-01-25 NOTE — Telephone Encounter (Signed)
 Sent the message to you and Val.

## 2024-01-25 NOTE — Telephone Encounter (Signed)
 Ok, thanks.

## 2024-01-25 NOTE — Telephone Encounter (Signed)
 pt states that she is having increase anxiety sinc the latuda  change. she like to know if she can go back on the latuda  or try something else. pt was last seen on 5-28 next appt 9-8

## 2024-01-25 NOTE — Telephone Encounter (Signed)
 She can go back to Latuda  for now. Let her know to contact if symptoms fail to improve. Thanks

## 2024-01-25 NOTE — Telephone Encounter (Signed)
 Called patient she stated that she is already taking the Lamotrigine  50 mg daily she feels that everything started after stopping the Latuda  as far as the anxiety getting worse. She stated that she still has some of the Latuda  20 mg. Please advise

## 2024-02-10 ENCOUNTER — Encounter: Payer: Self-pay | Admitting: Endocrinology

## 2024-02-10 ENCOUNTER — Other Ambulatory Visit: Payer: Self-pay

## 2024-02-10 DIAGNOSIS — E039 Hypothyroidism, unspecified: Secondary | ICD-10-CM

## 2024-02-10 DIAGNOSIS — E201 Pseudohypoparathyroidism: Secondary | ICD-10-CM

## 2024-02-16 ENCOUNTER — Other Ambulatory Visit: Payer: Self-pay | Admitting: Endocrinology

## 2024-02-16 ENCOUNTER — Encounter: Payer: Self-pay | Admitting: Endocrinology

## 2024-02-17 LAB — RENAL FUNCTION PANEL
Albumin: 4.4 g/dL (ref 4.0–5.0)
BUN/Creatinine Ratio: 12 (ref 9–23)
BUN: 10 mg/dL (ref 6–20)
CO2: 19 mmol/L — ABNORMAL LOW (ref 20–29)
Calcium: 9.5 mg/dL (ref 8.7–10.2)
Chloride: 103 mmol/L (ref 96–106)
Creatinine, Ser: 0.83 mg/dL (ref 0.57–1.00)
Glucose: 87 mg/dL (ref 70–99)
Phosphorus: 4 mg/dL (ref 3.0–4.3)
Potassium: 4.1 mmol/L (ref 3.5–5.2)
Sodium: 140 mmol/L (ref 134–144)
eGFR: 100 mL/min/1.73 (ref >59)

## 2024-02-17 LAB — TSH: TSH: 2.35 u[IU]/mL (ref 0.450–4.500)

## 2024-02-17 LAB — T4: T4, Total: 12.9 ug/dL — ABNORMAL HIGH (ref 4.5–12.0)

## 2024-02-23 ENCOUNTER — Ambulatory Visit: Payer: Self-pay | Admitting: Endocrinology

## 2024-02-29 ENCOUNTER — Ambulatory Visit: Payer: BC Managed Care – PPO | Admitting: Endocrinology

## 2024-03-07 ENCOUNTER — Ambulatory Visit: Admitting: Endocrinology

## 2024-03-07 ENCOUNTER — Encounter: Payer: Self-pay | Admitting: Endocrinology

## 2024-03-07 VITALS — BP 110/70 | HR 92 | Resp 20 | Ht 65.0 in | Wt 200.6 lb

## 2024-03-07 DIAGNOSIS — E201 Pseudohypoparathyroidism: Secondary | ICD-10-CM | POA: Diagnosis not present

## 2024-03-07 DIAGNOSIS — E039 Hypothyroidism, unspecified: Secondary | ICD-10-CM

## 2024-03-07 DIAGNOSIS — E559 Vitamin D deficiency, unspecified: Secondary | ICD-10-CM

## 2024-03-07 LAB — T4, FREE: Free T4: 1.2 ng/dL (ref 0.8–1.8)

## 2024-03-07 LAB — TSH: TSH: 1.15 m[IU]/L

## 2024-03-07 NOTE — Progress Notes (Signed)
 Outpatient Endocrinology Note Colleen Dalissa Lovin, MD   Patient's Name: Colleen Tapia    DOB: Aug 08, 1995    MRN: [id number]  REASON OF VISIT: Follow up for pseudohypoparathyroidism / hypocalcemia / hypothyroidism  PCP: Alla Amis, MD  HISTORY OF PRESENT ILLNESS:   Colleen Tapia is a 28 y.o. 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 - She went for routine visit with her PCP and was found to have a calcium was 6.9 in June 2021. She thinks that her calcium probably had been low previously also. Ionized calcium was also low at 3.7, normal >4.5. PTH level 384.  Consistent with pseudohypoparathyroidism.  Patient has family history of pseudohypoparathyroidism in mother and brother. -Patient has been treated with calcitriol , vitamin D3 and calcium supplement. - Her pretreatment symptoms were tightness and cramping in her fingers, at times also a tightness in her face making it difficult to talk.   Calcium levels have been consistently normal with taking CALCITRIOL  0.25 mcg.  Calcitriol  was decreased in the past due to high normal serum calcium. She has been continued on calcium supplements, using Os-Cal 1200 mg /with vitamin D3 twice daily.  Takes vitamin D3 over-the-counter 1000 international unit daily.  # Hypothyroidism  -Previously being managed as secondary hypothyroidism, at the time of initial diagnosis free T4 was mildly low at 0.59, no TSH available at that time. -Has been on levothyroxine  50 mcg daily.   Interval history  Patient laboratory results reviewed, serum calcium 9.5 normal.  Normal electrolytes.  Normal renal function.  TSH normal and total T4 mildly appropriately elevated probably related to taking combined oral contraceptive pill.  She has no palpitation and heat intolerance.  No  numbness and tingling of the extremities.  No other complaints today.   Latest Reference Range & Units 02/16/24 08:30  TSH 0.450 - 4.500 uIU/mL 2.350  Thyroxine (T4) 4.5 - 12.0 ug/dL 12.9 (H)  (H): Data is abnormally high    REVIEW OF SYSTEMS:  As per history of present illness.   PAST MEDICAL HISTORY: Past Medical History:  Diagnosis Date   Anxiety    Thyroid  disease     PAST SURGICAL HISTORY: Past Surgical History:  Procedure Laterality Date   WISDOM TOOTH EXTRACTION      ALLERGIES: Allergies  Allergen Reactions   No Known Allergies     FAMILY HISTORY:  Family History  Problem Relation Age of Onset   Anxiety disorder Mother    Depression Mother    No known family history of hypercalcemia, kidney stone, hyperparathyroidism, or endocrine neoplasms.   SOCIAL HISTORY: Social History   Socioeconomic History   Marital status: Single    Spouse name: Not on file   Number of children: 0   Years of education: Not on file   Highest education level: Some college, no degree  Occupational History   Not on file  Tobacco Use   Smoking status: Never   Smokeless tobacco: Never  Vaping Use   Vaping status: Never Used  Substance and Sexual Activity   Alcohol use: Yes    Alcohol/week: 3.0 - 5.0 standard drinks of alcohol    Types: 3 - 4 Shots of liquor per week   Drug use: Not Currently   Sexual activity: Not Currently  Other Topics Concern   Not on file  Social History  Narrative   Not on file   Social Drivers of Health   Financial Resource Strain: Low Risk  (04/30/2023)   Received from [facility]   Overall Financial Resource Strain (CARDIA)    Difficulty of Paying Living Expenses: Not hard at all  Food Insecurity: No Food Insecurity (10/26/2023)   Received from [facility]   Hunger Vital Sign    Within the past 12 months, you worried that your food would run out before you got the money to buy more.: Never true    Within the past 12 months, the food  you bought just didn't last and you didn't have money to get more.: Never true  Transportation Needs: No Transportation Needs (10/26/2023)   Received from [facility] - Transportation    Lack of Transportation (Medical): No    Lack of Transportation (Non-Medical): No  Physical Activity: Sufficiently Active (06/23/2018)   Exercise Vital Sign    Days of Exercise per Week: 5 days    Minutes of Exercise per Session: 50 min  Stress: Not on file  Social Connections: Unknown (06/23/2018)   Social Connection and Isolation Panel    Frequency of Communication with Friends and Family: Not on file    Frequency of Social Gatherings with Friends and Family: Not on file    Attends Religious Services: More than 4 times per year    Active Member of [employer] or Organizations: Yes    Attends Engineer, structural: More than 4 times per year    Marital Status: Never married    MEDICATIONS:  Current Outpatient Medications  Medication Sig Dispense Refill   busPIRone  (BUSPAR ) 10 MG tablet Take 2 tablets (20 mg total) by mouth in the morning and 2 tablets(20 mg total) in the evening and Take 1 tablet (10 mg total) at lunch. 450 tablet 1   calcitRIOL  (ROCALTROL ) 0.25 MCG capsule Take 1 capsule (0.25 mcg total) by mouth daily. 90 capsule 3   Calcium Carb-Cholecalciferol (CALCIUM PLUS VITAMIN D3 PO) Take 1,200 mg by mouth. Plus 25 mcg Vitamin D3     fluticasone (FLONASE) 50 MCG/ACT nasal spray Place into the nose.     Lactobacillus (AZO COMPLETE FEMININE BALANCE PO) AZO Complete Feminine Balance     lamoTRIgine  (LAMICTAL ) 25 MG tablet Take 1 tablet (25 mg total) by mouth 2 (two) times daily. 60 tablet 5   levocetirizine (XYZAL) 5 MG tablet Take 5 mg by mouth every evening.     levothyroxine  (SYNTHROID ) 50 MCG tablet Take 1 tablet (50 mcg total) by mouth daily. 90 tablet 3   lurasidone  (LATUDA ) 20 MG TABS tablet Take 1 tablet (20 mg total) by mouth daily with supper. 90 tablet 1    norethindrone-ethinyl estradiol-FE (LOESTRIN FE) 1-20 MG-MCG tablet Take 1 tablet by mouth daily.     tirzepatide (ZEPBOUND) 5 MG/0.5ML Pen Inject 5 mg into the skin once a week.     traZODone  (DESYREL ) 50 MG tablet Take 1 tablet (50 mg total) by mouth at bedtime. 90 tablet 1   Current Facility-Administered Medications  Medication Dose Route Frequency Provider Last Rate Last Admin   betamethasone  acetate-betamethasone  sodium phosphate (CELESTONE ) injection 3 mg  3 mg Intra-articular Once Janit Thresa HERO, DPM        PHYSICAL EXAM: Vitals:   03/07/24 1021  BP: 110/70  Pulse: 92  Resp: 20  SpO2: 99%  Weight: 200 lb 9.6 oz (91 kg)  Height: 5' 5 (1.651 m)  Body mass index is 33.38 kg/m.    General: Well developed, well nourished female in no apparent distress.  HEENT: AT/[place], no external lesions. Hearing intact to the spoken word Eyes: Conjunctiva clear and no icterus. Neck: Trachea midline, neck supple  Lungs: Clear to auscultation, no wheeze. Respirations not labored Heart: S1S2, Regular in rate and rhythm.  Abdomen: Soft, non tender, non distended Neurologic: Alert, oriented, normal speech, deep tendon biceps reflexes normal,  no gross focal neurological deficit Extremities: No pedal pitting edema, no tremors of outstretched hands.  Chovstek's sign negative. Skin: Warm, color good. Psychiatric: Does not appear depressed or anxious  PERTINENT HISTORIC LABORATORY AND IMAGING STUDIES:  All pertinent laboratory results were reviewed. Please see HPI also for further details.   ASSESSMENT / PLAN  1. Pseudohypoparathyroidism   2. Hypothyroidism, unspecified type   3. Vitamin D  deficiency     # Pseudohypoparathyroidism /vitamin D  deficiency: -She has mild baseline symptoms of tightness in her hands and face and cramping.  Serum calcium has remained normal with supplements.  # Hypothyroidism ?  Primary likely. -Recent with normal TSH and mildly elevated total T4 likely related  to taking combined oral contraceptive pills.  Currently taking levothyroxine  50 mcg daily.  Plan: -Continue current dose of calcitriol  0.25 mcg daily. -Continue calcium/vitamin D  200 mg 2 times a day.  Advised to take Tums as needed for symptoms of hypocalcemia.  Continue vitamin D3 over-the-counter 1000 international unit daily.  -Continue levothyroxine  50 mcg daily. - Recent total T4 mildly elevated would like to recheck TSH and free T4 in the clinic today.  Labs for renal function panel, thyroid  function test and vitamin D  level prior to follow-up visit in 6 months.  She wants to complete lab at [facility] provided paper prescription.   Diagnoses and all orders for this visit:  Pseudohypoparathyroidism -     Renal function panel -     Renal function panel  Hypothyroidism, unspecified type -     T4, free -     TSH -     T4, free -     TSH -     T4, free -     TSH  Vitamin D  deficiency -     VITAMIN D  25 Hydroxy (Vit-D Deficiency, Fractures) -     VITAMIN D  25 Hydroxy (Vit-D Deficiency, Fractures)     DISPOSITION Follow up in clinic in 6 months suggested.  Labs today and prior to follow-up visit.  All questions answered and patient verbalized understanding of the plan.  Colleen Gladys Gutman, MD [facility] [address] [place], [address] [place], [place] [address] Phone # [phone]  At least part of this note was generated using voice recognition software. Inadvertent word errors may have occurred, which were not recognized during the proofreading process.

## 2024-03-08 ENCOUNTER — Ambulatory Visit: Payer: Self-pay | Admitting: Endocrinology

## 2024-03-23 ENCOUNTER — Telehealth: Payer: Self-pay

## 2024-03-23 NOTE — Telephone Encounter (Signed)
 Pt was notified.

## 2024-03-23 NOTE — Telephone Encounter (Signed)
 pt left message that she is having alot of anxiety and it making her feel really bad. pt was last seen on 5-28 next appt 9-8  Can you give her something until she gets to see you

## 2024-03-23 NOTE — Telephone Encounter (Signed)
 Please call her and let her know that she can increase buspar  to 20 mg at lunch from 10 mg at lunch and we can provide her hydroxyzine(similar to benadryl) as needed for anxiety. Please let me know. Thanks

## 2024-03-24 ENCOUNTER — Other Ambulatory Visit: Payer: Self-pay | Admitting: Child and Adolescent Psychiatry

## 2024-03-24 DIAGNOSIS — F410 Panic disorder [episodic paroxysmal anxiety] without agoraphobia: Secondary | ICD-10-CM

## 2024-03-25 ENCOUNTER — Telehealth: Payer: Self-pay

## 2024-03-25 NOTE — Telephone Encounter (Signed)
 pt called states that she took the buspirone  10mg  2 in the morning and 2 at lunch. she stated that she went to a ball game and when she started walking she didn't feel well that when they checked her bp and it was 160/56 she said she didn't know if it was due to the  medication increase.  She said that she does have an appt at 1:30 with her pcp

## 2024-03-25 NOTE — Progress Notes (Signed)
 No chief complaint on file.   HPI  Colleen Tapia is a 28 y.o. here for an acute issue.  She has a PMH of HLD, obesity, anxiety who presents with headaches, tachycardia, elevated blood pressure last evening.  General malaise.  Recent sinusitis with antibiotic treatment.  Subsequent bowel changes including diarrhea.  Headache behind the eyes.  Also posterior as well.  Work Building control surveyor work.  No current fever or chills.  No chest pain.    ROS  Pertinent items are noted in HPI.  Outpatient Encounter Medications as of 03/25/2024  Medication Sig Dispense Refill  . busPIRone  (BUSPAR ) 10 MG tablet Take 20 mg by mouth 2 (two) times daily And 10 mg at lunch    . calcitRIOL  (ROCALTROL ) 0.25 MCG capsule Take 0.25 mcg by mouth once daily In am    . calcium carbonate-vitamin D3 (CALTRATE 600+D) 600 mg(1,500mg ) -400 unit tablet Take 1 tablet by mouth 2 (two) times daily 60 tablet 11  . fluticasone (FLONASE) 50 mcg/actuation nasal spray Place 2 sprays into both nostrils once daily. (Patient taking differently: Place 2 sprays into both nostrils once daily as needed) 16 g 0  . LATUDA  40 mg tablet Take 20 mg by mouth daily with breakfast    . levocetirizine (XYZAL) 5 MG tablet Take 5 mg by mouth every evening    . levothyroxine  (SYNTHROID ) 50 MCG tablet Take 50 mcg by mouth once daily    . norgestimate-ethinyl estradioL (SPRINTEC 0.25/35, 28,) 0.25-35 mg-mcg tablet Take 1 tablet by mouth once daily 84 tablet 1  . phenazopyridine HCl (AZO ORAL) Take by mouth Probiotic daily    . tirzepatide (ZEPBOUND) 2.5 mg/0.5 mL injection Inject 0.5 mLs (2.5 mg total) subcutaneously every 7 (seven) days 2 mL 3  . tirzepatide (ZEPBOUND) 5 mg/0.5 mL pen injector Inject 0.5 mLs (5 mg total) subcutaneously every 7 (seven) days 2 mL 1  . traZODone  (DESYREL ) 50 MG tablet Take 50 mg by mouth at bedtime 25MG  DAILY    . lamoTRIgine  (LAMICTAL ) 25 MG tablet Take 25 mg by mouth 2 (two) times daily     No facility-administered  encounter medications on file as of 03/25/2024.    Allergies as of 03/25/2024  . (No Known Allergies)    Past Medical History:  Diagnosis Date  . Allergy Long time ago   Seasonal  . Anxiety   . Depression   . Hypercholesterolemia (LDL 143 - 12/28/19) - diet controlled 12/29/2019  . Secondary hypothyroidism (TSH 4 - 04/02/22) - followed by Dr. Von 04/07/2022    Past Surgical History:  Procedure Laterality Date  . wisdom tooth extraction  01/2018  . MYRINGOTOMY & TUBES      Vitals:   03/25/24 1320  BP: 136/80  Pulse: 105    Physical Exam  General. Well appearing; NAD; VS reviewed     HEENT: Sclera and conjunctiva clear; EOMI, Orapharynx: uvula midline.  No erythema.  No lesions.  Nml dentition. Neck. Supple. No thyromegaly, lymphadenopathy. Lungs. Respirations unlabored; clear to auscultation bilaterally. Cardiovascular. Heart regular rate and rhythm without murmurs, gallops, or rubs. Extremities:  No edema. Skin. Normal color and turgor Neurologic. Alert and oriented x3   Assessment and Plan  28 year old with above PMH who reports several symptoms including headaches, but also malaise, fast heart rate and elevated blood pressures.  Has felt heart racing with exertion.  More fatigued than usual.  Recent bout of sinusitis with antibiotics and subsequent diarrhea.  Plan to check labs today.  Normal EKG today.  Low suspicion for any acute cardiac etiology.  1. Tachycardia  -     ECG 12-lead -     CBC w/auto Differential (5 Part) -     Comprehensive Metabolic Panel (CMP)  2. Acute nonintractable headache, unspecified headache type Likely tension type headache.  Consider viral etiology as well.  3. Diarrhea, unspecified type Recent antibiotics.  Stay on bland diet with probiotics.    I have personally performed this service.  [address] [place], [place]

## 2024-03-29 MED ORDER — HYDROXYZINE HCL 25 MG PO TABS: 12.5000 mg | ORAL_TABLET | Freq: Three times a day (TID) | ORAL | 0 refills | Status: DC | PRN

## 2024-03-29 MED ORDER — ESCITALOPRAM OXALATE 5 MG PO TABS
5.0000 mg | ORAL_TABLET | Freq: Every day | ORAL | 0 refills | Status: DC
Start: 2024-03-29 — End: 2024-04-25

## 2024-03-29 NOTE — Telephone Encounter (Signed)
 I spoke with pt, addressed her concerns regarding increased anxiety which she says has worsened since last one month without any specific triggers, and she has a lot of anxiety that something medically is wrong with her. We discussed the ways to minimize rumination around this. Discussed trialing Lexapro  while monitoring for mood. Previously we have thought about a bipolar disorder diagnosis based on the symptoms she reported around 2022, however over the time those symptoms felt more likely in the context of psychosocial stressors she had in 2022. Nonetheless discussed that lexapro  could lead to emerging mania if she truly has underlying bipolar disorder. She is advised to report if she notices any mood changes, has an appointment with me on 09/08. Discussed other side effects including but not limited to suicidal thoughts warning. She provided verbal informed consent for lexapro  as well atarax  PRN.

## 2024-03-29 NOTE — Telephone Encounter (Signed)
Spoke with pt over the phone.

## 2024-04-04 ENCOUNTER — Telehealth (INDEPENDENT_AMBULATORY_CARE_PROVIDER_SITE_OTHER): Admitting: Child and Adolescent Psychiatry

## 2024-04-04 DIAGNOSIS — F3175 Bipolar disorder, in partial remission, most recent episode depressed: Secondary | ICD-10-CM | POA: Diagnosis not present

## 2024-04-04 DIAGNOSIS — F410 Panic disorder [episodic paroxysmal anxiety] without agoraphobia: Secondary | ICD-10-CM | POA: Diagnosis not present

## 2024-04-04 DIAGNOSIS — F411 Generalized anxiety disorder: Secondary | ICD-10-CM

## 2024-04-04 NOTE — Progress Notes (Signed)
 Virtual Visit via Video Note  I connected with Colleen Tapia on 04/04/24 at 11:00 AM EDT by a video enabled telemedicine application and verified that I am speaking with the correct person using two identifiers.  Location: Patient: Work Provider: Office   I discussed the limitations of evaluation and management by telemedicine and the availability of in person appointments. The patient expressed understanding and agreed to proceed.  I discussed the assessment and treatment plan with the patient. The patient was provided an opportunity to ask questions and all were answered. The patient agreed with the plan and demonstrated an understanding of the instructions.   The patient was advised to call back or seek an in-person evaluation if the symptoms worsen or if the condition fails to improve as anticipated.  Colleen CHRISTELLA Marek, MD     [facility] MD/PA/NP OP Progress Note  04/04/2024 11:24 AM Colleen Tapia  MRN:  [id number]  Chief Complaint: doing ok.Colleen Tapia  HPI: This is a 28 year old Caucasian female with hx of generalized anxiety disorder, MDD, panic attacks previously on Zoloft  and BuSpar  since March 2019 and also tried on Wellbutrin  XL which caused more irritability and therefore discontinued.   Jessikah was seen and evaluated over telemedicine encounter for medication management follow-up.  She was present by herself and was evaluated alone.  In the interim since last appointment she called and reported increased anxiety, I spoke with her last week and recommended to start taking Lexapro  5 mg daily while continuing rest of her current medications.  She tried increased dose of BuSpar  to 20 mg 3 times a day however she felt that it made her more anxious and therefore we went down back to BuSpar  20 mg twice daily and then milligram at noon.  At her last appointment we discussed to discontinue Latuda  however she felt that Latuda  discontinuation caused anxiety and therefore she was told to restart  it.  During the appointment today she reported that she is tolerating Lexapro  well however it has been causing some sleep interference and therefore she was recommended to switch Lexapro  5 mg to morning.  She reported that she does not take trazodone  50 mg and takes only 25 mg as needed and recommended to continue with that as well.  She tells me that her anxiety is around 5 out of 10, 10 being most anxious, her anxiety appears to have increased in the context of her illnesses in the month of August, and she appears to overthink about having medical problems.  She denied SI or HI, reported that her mood has been good, has been eating okay, has been seeing her therapist now trying to see about every week.  We discussed to continue with Lexapro  5 mg daily, Latuda  20 mg daily, BuSpar  20 mg twice daily and 10 mg at noon, and lamotrigine  25 mg twice daily.  She asked me about my limited clinic schedule, we discussed that since she is requiring more frequent follow-up, we mutually agreed to switch her psychiatric care to nurse practitioner in the clinic and also encouraged her to look for psychiatrist in [place] area as she has been living there.  She agreed to look into it while establishing psychiatric care with nurse practitioner.  She will see him in a month.     Visit Diagnosis:    ICD-10-CM   1. Generalized anxiety disorder with panic attacks  F41.1    F41.0     2. Bipolar disorder, in partial remission, most recent episode depressed (  HCC)  F31.75                 Past Psychiatric History: reviewed today, no change.   Past Medical History:  Past Medical History:  Diagnosis Date   Anxiety    Thyroid  disease     Past Surgical History:  Procedure Laterality Date   WISDOM TOOTH EXTRACTION      Family Psychiatric History: As mentioned in initial H&P, reviewed today, no change  Family History:  Family History  Problem Relation Age of Onset   Anxiety disorder Mother    Depression  Mother     Social History:  Social History   Socioeconomic History   Marital status: Single    Spouse name: Not on file   Number of children: 0   Years of education: Not on file   Highest education level: Some college, no degree  Occupational History   Not on file  Tobacco Use   Smoking status: Never   Smokeless tobacco: Never  Vaping Use   Vaping status: Never Used  Substance and Sexual Activity   Alcohol use: Yes    Alcohol/week: 3.0 - 5.0 standard drinks of alcohol    Types: 3 - 4 Shots of liquor per week   Drug use: Not Currently   Sexual activity: Not Currently  Other Topics Concern   Not on file  Social History Narrative   Not on file   Social Drivers of Health   Financial Resource Strain: Low Risk  (04/30/2023)   [facility]   Overall Financial Resource Strain (CARDIA)    Difficulty of Paying Living Expenses: Not hard at all  Food Insecurity: No Food Insecurity (10/26/2023)   Received from [facility]   Hunger Vital Sign    Within the past 12 months, you worried that your food would run out before you got the money to buy more.: Never true    Within the past 12 months, the food you bought just didn't last and you didn't have money to get more.: Never true  Transportation Needs: No Transportation Needs (10/26/2023)   Received from [facility]   PRAPARE - Transportation    Lack of Transportation (Medical): No    Lack of Transportation (Non-Medical): No  Physical Activity: Sufficiently Active (06/23/2018)   Exercise Vital Sign    Days of Exercise per Week: 5 days    Minutes of Exercise per Session: 50 min  Stress: Not on file  Social Connections: Unknown (06/23/2018)   Social Connection and Isolation Panel    Frequency of Communication with Friends and Family: Not on file    Frequency of Social Gatherings with Friends and Family: Not on file    Attends Religious Services: More than 4 times per year    Active Member of [employer] or  Organizations: Yes    Attends Banker Meetings: More than 4 times per year    Marital Status: Never married    Allergies:  Allergies  Allergen Reactions   No Known Allergies     Metabolic Disorder Labs: No results found for: HGBA1C, MPG Lab Results  Component Value Date   PROLACTIN 13.5 02/01/2020   No results found for: CHOL, TRIG, HDL, CHOLHDL, VLDL, LDLCALC Lab Results  Component Value Date   TSH 1.15 03/07/2024   TSH 2.350 02/16/2024    Therapeutic Level Labs: No results found for: LITHIUM No results found for: VALPROATE No results found for: CBMZ  Current Medications: Current  Outpatient Medications  Medication Sig Dispense Refill   busPIRone  (BUSPAR ) 10 MG tablet TAKE 2 TABLETS BY MOUTH EACH MORNING, 1 TABLET AT LUNCH, & 2 TABLETS EACH EVENING 150 tablet 5   calcitRIOL  (ROCALTROL ) 0.25 MCG capsule Take 1 capsule (0.25 mcg total) by mouth daily. 90 capsule 3   Calcium Carb-Cholecalciferol (CALCIUM PLUS VITAMIN D3 PO) Take 1,200 mg by mouth. Plus 25 mcg Vitamin D3     escitalopram  (LEXAPRO ) 5 MG tablet Take 1 tablet (5 mg total) by mouth daily. 30 tablet 0   fluticasone (FLONASE) 50 MCG/ACT nasal spray Place into the nose.     hydrOXYzine  (ATARAX ) 25 MG tablet Take 0.5-1 tablets (12.5-25 mg total) by mouth 3 (three) times daily as needed. 30 tablet 0   Lactobacillus (AZO COMPLETE FEMININE BALANCE PO) AZO Complete Feminine Balance     lamoTRIgine  (LAMICTAL ) 25 MG tablet Take 1 tablet (25 mg total) by mouth 2 (two) times daily. 60 tablet 5   levocetirizine (XYZAL) 5 MG tablet Take 5 mg by mouth every evening.     levothyroxine  (SYNTHROID ) 50 MCG tablet Take 1 tablet (50 mcg total) by mouth daily. 90 tablet 3   lurasidone  (LATUDA ) 20 MG TABS tablet Take 1 tablet (20 mg total) by mouth daily with supper. 90 tablet 1   norethindrone-ethinyl estradiol-FE (LOESTRIN FE) 1-20 MG-MCG tablet Take 1 tablet by mouth daily.     tirzepatide  (ZEPBOUND) 5 MG/0.5ML Pen Inject 5 mg into the skin once a week.     traZODone  (DESYREL ) 50 MG tablet TAKE 1 TABLET BY MOUTH EVERYDAY AT BEDTIME 30 tablet 5   Current Facility-Administered Medications  Medication Dose Route Frequency Provider Last Rate Last Admin   betamethasone  acetate-betamethasone  sodium phosphate (CELESTONE ) injection 3 mg  3 mg Intra-articular Once Evans, Brent M, DPM         Musculoskeletal: Strength & Muscle Tone: unable to assess since visit was over the telemedicine. Gait & Station: unable to assess since visit was over the telemedicine. Patient leans: N/A  Psychiatric Specialty Exam: ROSReview of 12 systems negative except as mentioned in HPI   There were no vitals taken for this visit.There is no height or weight on file to calculate BMI.  General Appearance: Casual and Fairly Groomed  Eye Contact:  Good  Speech:  Clear and Coherent and Normal Rate  Volume:  Normal  Mood:  ok..  Affect:  Appropriate, Congruent, and Restricted  Thought Process:  Goal Directed and Linear  Orientation:  Full (Time, Place, and Person)  Thought Content: Logical   Suicidal Thoughts:  No  Homicidal Thoughts:  No  Memory:  Immediate;   Fair Recent;   Fair Remote;   Fair  Judgement:  Fair  [facility]:  Fair  Psychomotor Activity:  Normal  Concentration:  Concentration: Good and Attention Span: Good  Recall:  Good  Fund of Knowledge: Good  Language: Good  Akathisia:  No    AIMS (if indicated): not done  Assets:  Communication Skills Desire for Improvement Financial Resources/Insurance Housing Leisure Time Physical Health Social Support Talents/Skills Transportation Vocational/Educational  ADL's:  Intact  Cognition: WNL  Sleep:   Good   Screenings: Flowsheet Row UC from 05/16/2023 in [facility] [place]  UC from 02/21/2023 in [facility]  ED from 08/14/2021 in [facility]   C-SSRS RISK CATEGORY No Risk No Risk No Risk     Assessment and Plan:  28 yo F with hx of anxiety disorder on Zoloft  since March of 2019 and was previously in counseling at [facility].  She is diagnosed with generalized anxiety disorder with panic attacks and has responded well to Zoloft  100 mg once a day and BuSpar  10 mg 2 times a day. However recently has noticed more mood fluctuation, irritability, agitation, depressed mood, sleep changes which initially was thought to be consistent with MDD. However Zoloft  and Wellbutrin  both worsened her symptoms, and she continued to experience mood lability, irritability, agitation, depressed mood, sleep changes, recently noted self applying to 100 jobs and there is a strong +ve family of bipolar disorder and therefore considering the dx of Bipolar Disorder.  She was started on Latuda  as pt's mother and brother has responded well to it.   At her appointment in 04/2021, she reported worsening of mood lability, anxiety and sleep in the context of  psychosocial stressors(not getting a job yet, overthinking about her future and relationships). She reported that she was more tearful, sad, has lack of motivation attending school, problems with sleep and energy, which appeared to be most likely in the context of her ongoing psychosocial stressors.   Update on 04/04/24 -patient has been experiencing worsening of anxiety over the last month in the context of multiple medical issues such as UTI, sinus infection and appears to be overthinking about having other medical conditions.  Trialing Lexapro  while monitoring for mood. Previously we have thought about a bipolar disorder diagnosis based on the symptoms she reported around 2022, however over the time those symptoms felt more likely in the context of psychosocial stressors she had in 2022. Nonetheless discussed that lexapro  could lead to emerging mania if she truly has underlying bipolar  disorder. She is advised to report if she notices any mood changes, has an appointment with me on 09/08. Discussed other side effects including but not limited to suicidal thoughts warning. She provided verbal informed consent for lexapro  as well atarax  PRN.      Plan as mentioned below.      Plan: Problem 1: Mood(chronic and stable) - Continue with Lamictal  25 mg BID - Continue Latuda  20 mg daily with dinner with plan to discontinue at the next appointment if she continues to have stability with mood. - Ind therapy with Russell Pinal at [facility]. - Recommended life style interventions for weight management and mental health.   Problem 2: Generalized Anxiety with Panic attack(chronic andstable) Plan:           - Continue Lexapro  5 mg daily and continue with Buspar  20 mg BID and take Buspar  10 mg at noon   Problem 3: Sleep Plan:  - Taking Trazodone  50 mg at bedtime now and continues to sleep well, recommending to try 25 mg at bedtime and discontinue if she continues to sleep well.                  Colleen CHRISTELLA Marek, MD 04/04/2024, 11:24 AM

## 2024-04-22 ENCOUNTER — Other Ambulatory Visit: Payer: Self-pay | Admitting: Child and Adolescent Psychiatry

## 2024-04-24 ENCOUNTER — Other Ambulatory Visit: Payer: Self-pay | Admitting: Child and Adolescent Psychiatry

## 2024-04-25 ENCOUNTER — Telehealth (INDEPENDENT_AMBULATORY_CARE_PROVIDER_SITE_OTHER): Admitting: Psychiatry

## 2024-04-25 DIAGNOSIS — F3175 Bipolar disorder, in partial remission, most recent episode depressed: Secondary | ICD-10-CM | POA: Diagnosis not present

## 2024-04-25 DIAGNOSIS — F411 Generalized anxiety disorder: Secondary | ICD-10-CM | POA: Diagnosis not present

## 2024-04-25 DIAGNOSIS — F410 Panic disorder [episodic paroxysmal anxiety] without agoraphobia: Secondary | ICD-10-CM | POA: Diagnosis not present

## 2024-04-25 MED ORDER — ESCITALOPRAM OXALATE 5 MG PO TABS: 5.0000 mg | ORAL_TABLET | Freq: Every day | ORAL | 2 refills | Status: DC

## 2024-04-25 NOTE — Progress Notes (Cosign Needed Addendum)
 Psychiatric Initial Adult Assessment   Patient Identification: Colleen Tapia MRN:  [id number] Date of Evaluation:  04/25/2024 Referral Source: [facility] Chief Complaint:  New Patient Visit  Virtual Visit via Video Note  I connected with Colleen Tapia on 04/25/24 at  2:00 PM EDT by a video enabled telemedicine application and verified that I am speaking with the correct person using two identifiers.  Location: Patient: [address]  [employer] [place] [address]  Provider: [facility] Office of Provider   I discussed the limitations of evaluation and management by telemedicine and the availability of in person appointments. The patient expressed understanding and agreed to proceed.    I discussed the assessment and treatment plan with the patient. The patient was provided an opportunity to ask questions and all were answered. The patient agreed with the plan and demonstrated an understanding of the instructions.   The patient was advised to call back or seek an in-person evaluation if the symptoms worsen or if the condition fails to improve as anticipated.  I provided 60 minutes of non-face-to-face time during this encounter.   [name], NP   Visit Diagnosis:    ICD-10-CM   1. Generalized anxiety disorder with panic attacks  F41.1    F41.0     2. Bipolar disorder, in partial remission, most recent episode depressed (HCC)  F31.75       History of Present Illness: 28 year old female presenting [facility] for new patient visit.  Patient reports that she has been [place] patient and has been being treated for mood disorder, anxiety and sleep.  Patient reports that she has been having issues in regards of anxiety attacks stating that anxiety has been the primary concern for self as she has had panic attacks in the past but states that ever since the provider started her on Lexapro  previously she has been doing much better.  Patient currently on Lexapro  5 mg once daily for  management of anxiety and she stated has greatly improved without having a panic attack within the last month.  Patient also reports that she is having issues of medical issues in regards specifically to her cycle as well as her period.  Patient reports that she is being evaluated by OB/GYN and endocrinology regarding her hormones stating that she is having to take medications to manage her T4 levels as well as taking her birth control which is causing her to have 2 consistently be on hormonal medications without the placebo week stating that the doctor currently has her going from 1 new pack for the first 3 weeks to the next pack for the next 3 weeks regarding her birth control.  Patient reports that she has not had a regular period in a while but states that she feels that certain times she feels the intensity in her emotions and is not sure if this is related to her luteal phase of her hormonal cycle.  Patient reports that Dr.Umrania has been possibly exploring the fact that patient may not be bipolar in which she is asking if she can stop Latuda .  Based on this assessment interview is recommended for the patient to stop Latuda  and to monitor for symptoms and notify provider if she has any withdrawal effects.  Patient denies audiovisual hallucinations and states that she has not had any delusions or paranoia in the past.  Patient reports that the bipolar disorder typically was diagnosed due to her family's history of bipolar including her mother and her brother and  her symptoms regarding impulsivity as well as depressed mood and anxiety.  Patient will continue taking Lexapro  5 mg once daily, Lamictal  25 mg twice a day, hydroxyzine  25 mg 3 times a day as needed for anxiety, and trazodone  50 mg once daily as needed for sleep.  Patient is going to stop Latuda  and will notify the provider if she has withdrawal symptoms.  Patient with no other questions or concerns at this time.  Patient is in agreement with treatment  plan.  Patient to follow-up in 2 weeks.  Patient denies SI, HI, AVH.  Associated Signs/Symptoms: Depression Symptoms:  anxiety, (Hypo) Manic Symptoms:  Negative Anxiety Symptoms:  Excessive Worry, Panic Symptoms, Psychotic Symptoms:  Negative PTSD Symptoms: Negative  Past Psychiatric History: Previous Psych Hospitalizations: - Denies Outpatient treatment:  - Previously part of this clinic. Medications Current: - Lexapro  5 mg once daily -Lamictal  25 mg twice daily -Hydroxyzine  25 mg 3 times a day as needed for anxiety -Trazodone  50 mg once daily as needed for sleep -BuSpar  10 mg daily Next Steps: - Explore possible rule out bipolar disorder. Medication Trials: - Zoloft  100 mg, patient reports side effects and weight gain. Suicide & Violence: - Denies Substance Use: - Denies Psychotherapy: - Currently participating weekly therapy Legal:  Denies Previous Psychotropic Medications: Yes   Substance Abuse History in the last 12 months:  No.  Consequences of Substance Abuse: Negative   Past Medical History:  Past Medical History:  Diagnosis Date   Anxiety    Thyroid  disease     Past Surgical History:  Procedure Laterality Date   WISDOM TOOTH EXTRACTION      Family Psychiatric History: No additional  Family History:  Family History  Problem Relation Age of Onset   Anxiety disorder Mother    Depression Mother     Social History:   Social History   Socioeconomic History   Marital status: Single    Spouse name: Not on file   Number of children: 0   Years of education: Not on file   Highest education level: Some college, no degree  Occupational History   Not on file  Tobacco Use   Smoking status: Never   Smokeless tobacco: Never  Vaping Use   Vaping status: Never Used  Substance and Sexual Activity   Alcohol use: Yes    Alcohol/week: 3.0 - 5.0 standard drinks of alcohol    Types: 3 - 4 Shots of liquor per week   Drug use: Not Currently   Sexual  activity: Not Currently  Other Topics Concern   Not on file  Social History Narrative   Not on file   Social Drivers of Health   Financial Resource Strain: Low Risk  (04/30/2023)   [facility]   Overall Financial Resource Strain (CARDIA)    Difficulty of Paying Living Expenses: Not hard at all  Food Insecurity: No Food Insecurity (10/26/2023)   [facility]   Hunger Vital Sign    Within the past 12 months, you worried that your food would run out before you got the money to buy more.: Never true    Within the past 12 months, the food you bought just didn't last and you didn't have money to get more.: Never true  Transportation Needs: No Transportation Needs (10/26/2023)   Received from [facility]   PRAPARE - Transportation    Lack of Transportation (Medical): No    Lack of Transportation (Non-Medical): No  Physical Activity: Sufficiently Active (06/23/2018)   Exercise Vital Sign    Days of Exercise per Week: 5 days    Minutes of Exercise per Session: 50 min  Stress: Not on file  Social Connections: Unknown (06/23/2018)   Social Connection and Isolation Panel    Frequency of Communication with Friends and Family: Not on file    Frequency of Social Gatherings with Friends and Family: Not on file    Attends Religious Services: More than 4 times per year    Active Member of [employer] or Organizations: Yes    Attends Engineer, structural: More than 4 times per year    Marital Status: Never married    Additional Social History: No additional  Allergies:   Allergies  Allergen Reactions   No Known Allergies     Metabolic Disorder Labs: No results found for: HGBA1C, MPG Lab Results  Component Value Date   PROLACTIN 13.5 02/01/2020   No results found for: CHOL, TRIG, HDL, CHOLHDL, VLDL, LDLCALC Lab Results  Component Value Date   TSH 1.15 03/07/2024    Therapeutic Level Labs: No results found for: LITHIUM No  results found for: CBMZ No results found for: VALPROATE  Current Medications: Current Outpatient Medications  Medication Sig Dispense Refill   busPIRone  (BUSPAR ) 10 MG tablet TAKE 2 TABLETS BY MOUTH EACH MORNING, 1 TABLET AT LUNCH, & 2 TABLETS EACH EVENING 150 tablet 5   calcitRIOL  (ROCALTROL ) 0.25 MCG capsule Take 1 capsule (0.25 mcg total) by mouth daily. 90 capsule 3   Calcium Carb-Cholecalciferol (CALCIUM PLUS VITAMIN D3 PO) Take 1,200 mg by mouth. Plus 25 mcg Vitamin D3     escitalopram  (LEXAPRO ) 5 MG tablet Take 1 tablet (5 mg total) by mouth daily. 30 tablet 0   fluticasone (FLONASE) 50 MCG/ACT nasal spray Place into the nose.     hydrOXYzine  (ATARAX ) 25 MG tablet Take 0.5-1 tablets (12.5-25 mg total) by mouth 3 (three) times daily as needed. 30 tablet 0   Lactobacillus (AZO COMPLETE FEMININE BALANCE PO) AZO Complete Feminine Balance     lamoTRIgine  (LAMICTAL ) 25 MG tablet Take 1 tablet (25 mg total) by mouth 2 (two) times daily. 60 tablet 5   levocetirizine (XYZAL) 5 MG tablet Take 5 mg by mouth every evening.     levothyroxine  (SYNTHROID ) 50 MCG tablet Take 1 tablet (50 mcg total) by mouth daily. 90 tablet 3   lurasidone  (LATUDA ) 20 MG TABS tablet Take 1 tablet (20 mg total) by mouth daily with supper. 90 tablet 1   norethindrone-ethinyl estradiol-FE (LOESTRIN FE) 1-20 MG-MCG tablet Take 1 tablet by mouth daily.     tirzepatide (ZEPBOUND) 5 MG/0.5ML Pen Inject 5 mg into the skin once a week.     traZODone  (DESYREL ) 50 MG tablet TAKE 1 TABLET BY MOUTH EVERYDAY AT BEDTIME 30 tablet 5   Current Facility-Administered Medications  Medication Dose Route Frequency Provider Last Rate Last Admin   betamethasone  acetate-betamethasone  sodium phosphate (CELESTONE ) injection 3 mg  3 mg Intra-articular Once Evans, Brent M, DPM        Musculoskeletal: Strength & Muscle Tone: unable to assess since visit was over the telemedicine. Gait & Station: unable to assess since visit was over the  telemedicine. Patient leans: N/A   Psychiatric Specialty Exam: ROSReview of 12 systems negative except as mentioned in HPI    There were no vitals taken for this visit.There is no height or weight on file to calculate BMI.  General Appearance: Casual and Fairly  Groomed  Eye Contact:  Good  Speech:  Clear and Coherent and Normal Rate  Volume:  Normal  Mood:  ok..  Affect:  Appropriate, Congruent, and Restricted  Thought Process:  Goal Directed and Linear  Orientation:  Full (Time, Place, and Person)  Thought Content: Logical   Suicidal Thoughts:  No  Homicidal Thoughts:  No  Memory:  Immediate;   Fair Recent;   Fair Remote;   Fair  Judgement:  Fair  [facility]ight:  Fair  Psychomotor Activity:  Normal  Concentration:  Concentration: Good and Attention Span: Good  Recall:  Good  Fund of Knowledge: Good  Language: Good  Akathisia:  No     AIMS (if indicated): not done  Assets:  Communication Skills Desire for Improvement Financial Resources/Insurance Housing Leisure Time Physical Health Social Support Talents/Skills Transportation Vocational/Educational  ADL's:  Intact  Cognition: WNL  Sleep:   Good   Screenings: Flowsheet Row UC from 05/16/2023 in [facility] [place]  UC from 02/21/2023 in [facility]  ED from 08/14/2021 in [facility]  C-SSRS RISK CATEGORY No Risk No Risk No Risk    Assessment and Plan:  Problem 1: Mood(chronic and stable) - Continue with Lamictal  25 mg BID - Stop Latuda .  - Ind therapy with Russell Pinal at [facility]. - Recommended life style interventions for weight management and mental health.    Problem 2: Generalized Anxiety with Panic attack(chronic andstable) Plan:           - Continue Lexapro  5 mg daily and continue with Buspar  20 mg BID and take Buspar  10 mg at noon   - Continue Hydroxyzine  25mg  TID as needed for anxiety.  - Continue BuSpar  10 mg once  daily   Problem 3: Sleep Plan:             - Taking Trazodone  50 mg at bedtime now and continues to sleep well, recommending to try 25 mg at bedtime and discontinue if she continues to sleep well.    Plan - Psychotherapy: Currently participating in therapy - Education: Patient has been educated on how to reach out to this provider by calling the clinic or utilizing [employer].  Patient has been educated on medications, purpose,'s side effects, adverse reactions, dosage. - Follow-Up: Patient will follow-up in 2 weeks - Referrals: No referrals at this time. - Safety Planning:  The patient has been educated, if they should have suicidal thoughts with or without a plan to call 911, or go to the closest emergency department.  Pt verbalized understanding.  Pt denies firearms within the home.  Pt also agrees to call the clinic should they have worsening symptoms before the next appointment.    Patient/Guardian was advised Release of Information must be obtained prior to any record release in order to collaborate their care with an outside provider. Patient/Guardian was advised if they have not already done so to contact the registration department to sign all necessary forms in order for us  to release information regarding their care.   Consent: Patient/Guardian gives verbal consent for treatment and assignment of benefits for services provided during this visit. Patient/Guardian expressed understanding and agreed to proceed.   [name], NP 9/29/20252:04 PM

## 2024-05-01 ENCOUNTER — Other Ambulatory Visit: Payer: Self-pay | Admitting: Psychiatry

## 2024-05-01 NOTE — Telephone Encounter (Signed)
 Pt care transitioned to Jonathan Saucier

## 2024-05-09 ENCOUNTER — Telehealth (INDEPENDENT_AMBULATORY_CARE_PROVIDER_SITE_OTHER): Admitting: Psychiatry

## 2024-05-09 DIAGNOSIS — F411 Generalized anxiety disorder: Secondary | ICD-10-CM | POA: Diagnosis not present

## 2024-05-09 DIAGNOSIS — F410 Panic disorder [episodic paroxysmal anxiety] without agoraphobia: Secondary | ICD-10-CM

## 2024-05-09 DIAGNOSIS — F3175 Bipolar disorder, in partial remission, most recent episode depressed: Secondary | ICD-10-CM | POA: Diagnosis not present

## 2024-05-09 NOTE — Progress Notes (Unsigned)
 BH MD/PA/NP OP Progress Note  05/09/2024 2:04 PM Colleen Tapia  MRN:  [id number]  Chief Complaint: Routine Follow-up Virtual Visit via Video Note  I connected with Colleen Tapia on 05/09/24 at  2:00 PM EDT by a video enabled telemedicine application and verified that I am speaking with the correct person using two identifiers.  Location: Patient: [address]  [place] [address]  Provider: [facility] Office of Provider   I discussed the limitations of evaluation and management by telemedicine and the availability of in person appointments. The patient expressed understanding and agreed to proceed.    I discussed the assessment and treatment plan with the patient. The patient was provided an opportunity to ask questions and all were answered. The patient agreed with the plan and demonstrated an understanding of the instructions.   The patient was advised to call back or seek an in-person evaluation if the symptoms worsen or if the condition fails to improve as anticipated.  I provided 30 minutes of non-face-to-face time during this encounter.   [name], NP   HPI: 28 year old female presenting to [facility] for follow-up.  Patient presents today stating that she is doing okay on medications but states that she is feeling a little low due to some recent findings with it within her family.  Patient reports that she found out news that her mother has cancer which was found out during a family vacation.  They were out on vacation when they found out from their parents as the parents did not want them to leave the vacation and then find out that she has cancer.  They are not sure of what stage the cancer is but she states that the cancer was found in multiple parts of her mother's body in which they are waiting to hear back on testing and results.  They state that the mother had difficulty breathing and was continually being evaluated by pulmonology and her primary care doctor in  which it took several visits to find out that she may have lymphoma but it is not absolute.  Patient reports that this makes her feel uneasy as her mother is really important in her life.  Patient denies any improvement as she did not get the job that she was hoping for that she applied for at [employer].  Based on this assessment interview is recommended for patient to continue medications as prescribed.  Patient with no changes at this time.  Patient is in agreement with treatment plan.  Patient with no questions or concerns.  Patient denies SI, HI, AVH.  Patient to follow-up in 1 month. Visit Diagnosis:    ICD-10-CM   1. Generalized anxiety disorder with panic attacks  F41.1    F41.0     2. Bipolar disorder, in partial remission, most recent episode depressed (HCC)  F31.75       Past Psychiatric History:  Previous Psych Hospitalizations: - Denies Outpatient treatment:  - Previously part of this clinic. Medications Current: - Lexapro  5 mg once daily -Lamictal  25 mg twice daily -Hydroxyzine  25 mg 3 times a day as needed for anxiety -Trazodone  50 mg once daily as needed for sleep -BuSpar  20mg  BID 10 mg daily at lunchtime Next Steps: - Explore possible rule out bipolar disorder. Medication Trials: - Zoloft  100 mg, patient reports side effects and weight gain. Suicide & Violence: - Denies Substance Use: - Denies Psychotherapy: - Currently participating weekly therapy Legal:  Denies  Past Medical History:  Past  Medical History:  Diagnosis Date   Anxiety    Thyroid  disease     Past Surgical History:  Procedure Laterality Date   WISDOM TOOTH EXTRACTION      Family Psychiatric History:  - No additional  Family History:  Family History  Problem Relation Age of Onset   Anxiety disorder Mother    Depression Mother     Social History:  Social History   Socioeconomic History   Marital status: Single    Spouse name: Not on file   Number of children: 0   Years of  education: Not on file   Highest education level: Some college, no degree  Occupational History   Not on file  Tobacco Use   Smoking status: Never   Smokeless tobacco: Never  Vaping Use   Vaping status: Never Used  Substance and Sexual Activity   Alcohol use: Yes    Alcohol/week: 3.0 - 5.0 standard drinks of alcohol    Types: 3 - 4 Shots of liquor per week   Drug use: Not Currently   Sexual activity: Not Currently  Other Topics Concern   Not on file  Social History Narrative   Not on file   Social Drivers of Health   Financial Resource Strain: Low Risk  (04/18/2024)   Received from [facility]   Overall Financial Resource Strain (CARDIA)    Difficulty of Paying Living Expenses: Not hard at all  Food Insecurity: No Food Insecurity (04/18/2024)   Received from [facility]   Hunger Vital Sign    Within the past 12 months, you worried that your food would run out before you got the money to buy more.: Never true    Within the past 12 months, the food you bought just didn't last and you didn't have money to get more.: Never true  Transportation Needs: No Transportation Needs (04/18/2024)   Received from [facility]    In the past 12 months, has lack of transportation kept you from medical appointments or from getting medications?: No    Lack of Transportation (Non-Medical): No  Physical Activity: Sufficiently Active (06/23/2018)   Exercise Vital Sign    Days of Exercise per Week: 5 days    Minutes of Exercise per Session: 50 min  Stress: Not on file  Social Connections: Unknown (06/23/2018)   Social Connection and Isolation Panel    Frequency of Communication with Friends and Family: Not on file    Frequency of Social Gatherings with Friends and Family: Not on file    Attends Religious Services: More than 4 times per year    Active Member of [employer] or Organizations: Yes    Attends Tax inspector Meetings: More than 4 times per year    Marital Status: Never married    Allergies:  Allergies  Allergen Reactions   No Known Allergies     Metabolic Disorder Labs: No results found for: HGBA1C, MPG Lab Results  Component Value Date   PROLACTIN 13.5 02/01/2020   No results found for: CHOL, TRIG, HDL, CHOLHDL, VLDL, LDLCALC Lab Results  Component Value Date   TSH 1.15 03/07/2024   TSH 2.350 02/16/2024    Therapeutic Level Labs: No results found for: LITHIUM No results found for: VALPROATE No results found for: CBMZ  Current Medications: Current Outpatient Medications  Medication Sig Dispense Refill   busPIRone  (BUSPAR ) 10 MG tablet TAKE 2 TABLETS BY MOUTH EACH MORNING,  1 TABLET AT LUNCH, & 2 TABLETS EACH EVENING 150 tablet 5   calcitRIOL  (ROCALTROL ) 0.25 MCG capsule Take 1 capsule (0.25 mcg total) by mouth daily. 90 capsule 3   Calcium Carb-Cholecalciferol (CALCIUM PLUS VITAMIN D3 PO) Take 1,200 mg by mouth. Plus 25 mcg Vitamin D3     escitalopram  (LEXAPRO ) 5 MG tablet TAKE 1 TABLET (5 MG TOTAL) BY MOUTH DAILY. 90 tablet 1   escitalopram  (LEXAPRO ) 5 MG tablet Take 1 tablet (5 mg total) by mouth daily. 30 tablet 2   fluticasone (FLONASE) 50 MCG/ACT nasal spray Place into the nose.     hydrOXYzine  (ATARAX ) 25 MG tablet TAKE 0.5-1 TABLETS (12.5-25 MG TOTAL) BY MOUTH 3 (THREE) TIMES DAILY AS NEEDED. 90 tablet 1   Lactobacillus (AZO COMPLETE FEMININE BALANCE PO) AZO Complete Feminine Balance     lamoTRIgine  (LAMICTAL ) 25 MG tablet Take 1 tablet (25 mg total) by mouth 2 (two) times daily. 60 tablet 5   levocetirizine (XYZAL) 5 MG tablet Take 5 mg by mouth every evening.     levothyroxine  (SYNTHROID ) 50 MCG tablet Take 1 tablet (50 mcg total) by mouth daily. 90 tablet 3   lurasidone  (LATUDA ) 20 MG TABS tablet TAKE 1 TABLET BY MOUTH DAILY WITH SUPPER. 30 tablet 1   norethindrone-ethinyl estradiol-FE (LOESTRIN FE) 1-20 MG-MCG tablet Take 1 tablet by  mouth daily.     tirzepatide (ZEPBOUND) 5 MG/0.5ML Pen Inject 5 mg into the skin once a week.     traZODone  (DESYREL ) 50 MG tablet TAKE 1 TABLET BY MOUTH EVERYDAY AT BEDTIME 30 tablet 5   Current Facility-Administered Medications  Medication Dose Route Frequency Provider Last Rate Last Admin   betamethasone  acetate-betamethasone  sodium phosphate (CELESTONE ) injection 3 mg  3 mg Intra-articular Once Evans, Brent M, DPM         Musculoskeletal: Strength & Muscle Tone: within normal limits Gait & Station: normal Patient leans: N/A  Psychiatric Specialty Exam: Review of Systems  Constitutional: Negative.   HENT: Negative.    Eyes: Negative.   Respiratory: Negative.    Cardiovascular: Negative.   Gastrointestinal: Negative.   Endocrine: Negative.   Genitourinary: Negative.   Musculoskeletal: Negative.   Skin: Negative.   Allergic/Immunologic: Negative.   Neurological: Negative.   Hematological: Negative.   Psychiatric/Behavioral:  Positive for dysphoric mood. The patient is nervous/anxious.     There were no vitals taken for this visit.There is no height or weight on file to calculate BMI.  General Appearance: Well Groomed  Eye Contact:  Good  Speech:  Clear and Coherent  Volume:  Normal  Mood:  Anxious and Depressed  Affect:  Appropriate  Thought Process:  Coherent  Orientation:  Full (Time, Place, and Person)  Thought Content: Logical   Suicidal Thoughts:  No  Homicidal Thoughts:  No  Memory:  Immediate;   Good Recent;   Good Remote;   Good  Judgement:  Good  Insight:  Good  Psychomotor Activity:  Normal  Concentration:  Concentration: Good and Attention Span: Good  Recall:  Good  Fund of Knowledge: Good  Language: Good  Akathisia:  No  Handed:  Right  AIMS (if indicated): not done  Assets:  Desire for Improvement Financial Resources/Insurance Housing  ADL's:  Intact  Cognition: WNL  Sleep:  Fair   Screenings: GAD-7    Flowsheet Row Video Visit from  04/25/2024 in [facility]  Total GAD-7 Score 6   PHQ2-9    Flowsheet Row Video Visit  from 04/25/2024 in [facility]  PHQ-2 Total Score 0   Flowsheet Row UC from 05/16/2023 in [facility]  UC from 02/21/2023 in [facility]  ED from 08/14/2021 in [facility] at [facility]  C-SSRS RISK CATEGORY No Risk No Risk No Risk     Assessment and Plan:  Problem 1: Mood(chronic and stable) - Continue with Lamictal  25 mg BID - Ind therapy with Russell Pinal at [facility]. - Recommended life style interventions for weight management and mental health.    Problem 2: Generalized Anxiety with Panic attack(chronic andstable) Plan:           - Continue Lexapro  5 mg daily              - Continue Hydroxyzine  25mg  TID as needed for anxiety.             - Continue BuSpar  10 mg once daily   Problem 3: Sleep Plan:             - Taking Trazodone  50 mg at bedtime now and continues to sleep well, recommending to try 25 mg at bedtime and discontinue if she continues to sleep well.     Plan - Psychotherapy: Currently participating in therapy. - Education: Patient has been educated on how to reach out to this provider by calling the clinic or utilizing [employer].  Patient has been educated on medications, purpose,'s side effects, adverse reactions, dosage. - Follow-Up: Patient will follow-up in 2 weeks - Referrals: No referrals at this time. - Safety Planning:  The patient has been educated, if they should have suicidal thoughts with or without a plan to call 911, or go to the closest emergency department.  Pt verbalized understanding.  Pt denies firearms within the home.  Pt also agrees to call the clinic should they have worsening symptoms before the next appointment.      Patient/Guardian was advised Release of Information must be obtained prior to  any record release in order to collaborate their care with an outside provider. Patient/Guardian was advised if they have not already done so to contact the registration department to sign all necessary forms in order for us  to release information regarding their care.   Consent: Patient/Guardian gives verbal consent for treatment and assignment of benefits for services provided during this visit. Patient/Guardian expressed understanding and agreed to proceed.    [name], NP 05/09/2024, 2:04 PM

## 2024-06-06 ENCOUNTER — Telehealth (INDEPENDENT_AMBULATORY_CARE_PROVIDER_SITE_OTHER): Admitting: Psychiatry

## 2024-06-06 DIAGNOSIS — F411 Generalized anxiety disorder: Secondary | ICD-10-CM | POA: Diagnosis not present

## 2024-06-06 DIAGNOSIS — F410 Panic disorder [episodic paroxysmal anxiety] without agoraphobia: Secondary | ICD-10-CM | POA: Diagnosis not present

## 2024-06-06 NOTE — Progress Notes (Signed)
 BH MD/PA/NP OP Progress Note  06/06/2024 2:08 PM Colleen Tapia  MRN:  [id number]  Chief Complaint: Routine Follow-up Virtual Visit via Video Note  I connected with Colleen Tapia on 06/06/24 at  2:00 PM EST by a video enabled telemedicine application and verified that I am speaking with the correct person using two identifiers.  Location: Patient: [address]  [place] [address]  Provider: [facility] Office of Provider    I discussed the limitations of evaluation and management by telemedicine and the availability of in person appointments. The patient expressed understanding and agreed to proceed.    I discussed the assessment and treatment plan with the patient. The patient was provided an opportunity to ask questions and all were answered. The patient agreed with the plan and demonstrated an understanding of the instructions.   The patient was advised to call back or seek an in-person evaluation if the symptoms worsen or if the condition fails to improve as anticipated.  I provided 30 minutes of non-face-to-face time during this encounter.   [name], NP   HPI: 28 year old female presenting [facility] for follow-up.  Patient reports that she is doing okay stating that the medications have been going okay but would like to get off the Lamictal .  Patient reports that currently she still has stressors regarding her day-to-day but all in all has had a good venting episode last week in front of her coworkers in which she feels much better.  Patient denies any significant depression or anxiety stating that she is coming to terms in regards of her mother battling with cancer.  They still wait and Ansa patient anticipation regarding the grading as well as the severity of the cancer in which the mother is currently attending chemotherapy classes.  Patient reports she is still applying for jobs and is waiting to hear back from other jobs.  Patient states she would like to explore  whether or not she could go off of Lamictal  in which she is going to reduce the Lamictal  to 25 mg once daily.  Patient will continue the current medication regimen.  Patient with no other changes.  See plan.  Patient denies SI, HI, AVH.  Patient is in agreement with treatment plan.  Patient with no other questions or concerns.  Patient follow-up in 1 to 2 weeks. Visit Diagnosis:    ICD-10-CM   1. Generalized anxiety disorder with panic attacks  F41.1    F41.0       Past Psychiatric History:  Previous Psych Hospitalizations: - Denies Outpatient treatment:  - Previously part of this clinic. Medications Current: - Lexapro  5 mg once daily -Lamictal  25 mg once daily (if tolerated, will stop) -Hydroxyzine  25 mg 3 times a day as needed for anxiety -Trazodone  50 mg once daily as needed for sleep -BuSpar  20mg  BID 10 mg daily at lunchtime Next Steps: - Explore possible rule out bipolar disorder. Medication Trials: - Zoloft  100 mg, patient reports side effects and weight gain. Suicide & Violence: - Denies Substance Use: - Denies Psychotherapy: - Currently participating weekly therapy Legal:  Denies  Past Medical History:  Past Medical History:  Diagnosis Date   Anxiety    Thyroid  disease     Past Surgical History:  Procedure Laterality Date   WISDOM TOOTH EXTRACTION      Family Psychiatric History: No additional   Family History:  Family History  Problem Relation Age of Onset   Anxiety disorder Mother    Depression  Mother     Social History:  Social History   Socioeconomic History   Marital status: Single    Spouse name: Not on file   Number of children: 0   Years of education: Not on file   Highest education level: Some college, no degree  Occupational History   Not on file  Tobacco Use   Smoking status: Never   Smokeless tobacco: Never  Vaping Use   Vaping status: Never Used  Substance and Sexual Activity   Alcohol use: Yes    Alcohol/week: 3.0 - 5.0  standard drinks of alcohol    Types: 3 - 4 Shots of liquor per week   Drug use: Not Currently   Sexual activity: Not Currently  Other Topics Concern   Not on file  Social History Narrative   Not on file   Social Drivers of Health   Financial Resource Strain: Low Risk (05/26/2024)   Received from [facility]   Overall Financial Resource Strain (CARDIA)    How hard is it for you to pay for the very basics like food, housing, medical care, and heating?: Not hard at all  Food Insecurity: No Food Insecurity (05/26/2024)   Received from [facility]   Hunger Vital Sign    Within the past 12 months, you worried that your food would run out before you got the money to buy more.: Never true    Within the past 12 months, the food you bought just didn't last and you didn't have money to get more.: Never true  Transportation Needs: No Transportation Needs (05/26/2024)   Received from [facility] - Transportation    Lack of Transportation (Medical): No    Lack of Transportation (Non-Medical): No  Physical Activity: Sufficiently Active (06/23/2018)   Exercise Vital Sign    Days of Exercise per Week: 5 days    Minutes of Exercise per Session: 50 min  Stress: Not on file  Social Connections: Unknown (06/23/2018)   Social Connection and Isolation Panel    Frequency of Communication with Friends and Family: Not on file    Frequency of Social Gatherings with Friends and Family: Not on file    Attends Religious Services: More than 4 times per year    Active Member of [employer] or Organizations: Yes    Attends Banker Meetings: More than 4 times per year    Marital Status: Never married    Allergies:  Allergies  Allergen Reactions   No Known Allergies     Metabolic Disorder Labs: No results found for: HGBA1C, MPG Lab Results  Component Value Date   PROLACTIN 13.5 02/01/2020   No results found for: CHOL, TRIG, HDL, CHOLHDL, VLDL,  LDLCALC Lab Results  Component Value Date   TSH 1.15 03/07/2024   TSH 2.350 02/16/2024    Therapeutic Level Labs: No results found for: LITHIUM No results found for: VALPROATE No results found for: CBMZ  Current Medications: Current Outpatient Medications  Medication Sig Dispense Refill   busPIRone  (BUSPAR ) 10 MG tablet TAKE 2 TABLETS BY MOUTH EACH MORNING, 1 TABLET AT LUNCH, & 2 TABLETS EACH EVENING 150 tablet 5   calcitRIOL  (ROCALTROL ) 0.25 MCG capsule Take 1 capsule (0.25 mcg total) by mouth daily. 90 capsule 3   Calcium Carb-Cholecalciferol (CALCIUM PLUS VITAMIN D3 PO) Take 1,200 mg by mouth. Plus 25 mcg Vitamin D3     escitalopram  (LEXAPRO ) 5 MG tablet TAKE 1 TABLET (5 MG TOTAL) BY  MOUTH DAILY. 90 tablet 1   escitalopram  (LEXAPRO ) 5 MG tablet Take 1 tablet (5 mg total) by mouth daily. 30 tablet 2   fluticasone (FLONASE) 50 MCG/ACT nasal spray Place into the nose.     hydrOXYzine  (ATARAX ) 25 MG tablet TAKE 0.5-1 TABLETS (12.5-25 MG TOTAL) BY MOUTH 3 (THREE) TIMES DAILY AS NEEDED. 90 tablet 1   Lactobacillus (AZO COMPLETE FEMININE BALANCE PO) AZO Complete Feminine Balance     lamoTRIgine  (LAMICTAL ) 25 MG tablet Take 1 tablet (25 mg total) by mouth 2 (two) times daily. 60 tablet 5   levocetirizine (XYZAL) 5 MG tablet Take 5 mg by mouth every evening.     levothyroxine  (SYNTHROID ) 50 MCG tablet Take 1 tablet (50 mcg total) by mouth daily. 90 tablet 3   lurasidone  (LATUDA ) 20 MG TABS tablet TAKE 1 TABLET BY MOUTH DAILY WITH SUPPER. 30 tablet 1   norethindrone-ethinyl estradiol-FE (LOESTRIN FE) 1-20 MG-MCG tablet Take 1 tablet by mouth daily.     tirzepatide (ZEPBOUND) 5 MG/0.5ML Pen Inject 5 mg into the skin once a week.     traZODone  (DESYREL ) 50 MG tablet TAKE 1 TABLET BY MOUTH EVERYDAY AT BEDTIME 30 tablet 5   Current Facility-Administered Medications  Medication Dose Route Frequency Provider Last Rate Last Admin   betamethasone  acetate-betamethasone  sodium phosphate  (CELESTONE ) injection 3 mg  3 mg Intra-articular Once Evans, Brent M, DPM         Musculoskeletal: Strength & Muscle Tone: within normal limits Gait & Station: normal Patient leans: N/A   Psychiatric Specialty Exam: Review of Systems  Constitutional: Negative.   HENT: Negative.    Eyes: Negative.   Respiratory: Negative.    Cardiovascular: Negative.   Gastrointestinal: Negative.   Endocrine: Negative.   Genitourinary: Negative.   Musculoskeletal: Negative.   Skin: Negative.   Allergic/Immunologic: Negative.   Neurological: Negative.   Hematological: Negative.   Psychiatric/Behavioral:  Positive for dysphoric mood. The patient is nervous/anxious.     There were no vitals taken for this visit.There is no height or weight on file to calculate BMI.  General Appearance: Well Groomed  Eye Contact:  Good  Speech:  Clear and Coherent  Volume:  Normal  Mood:  Anxious and Depressed  Affect:  Appropriate  Thought Process:  Coherent  Orientation:  Full (Time, Place, and Person)  Thought Content: Logical   Suicidal Thoughts:  No  Homicidal Thoughts:  No  Memory:  Immediate;   Good Recent;   Good Remote;   Good  Judgement:  Good  [facility]:  Good  Psychomotor Activity:  Normal  Concentration:  Concentration: Good and Attention Span: Good  Recall:  Good  Fund of Knowledge: Good  Language: Good  Akathisia:  No  Handed:  Right  AIMS (if indicated): not done  Assets:  Desire for Improvement Financial Resources/Insurance Housing  ADL's:  Intact  Cognition: WNL  Sleep:  Fair   Screenings: GAD-7    Flowsheet Row Video Visit from 04/25/2024 in [facility]  Total GAD-7 Score 6   PHQ2-9    Flowsheet Row Video Visit from 04/25/2024 in [facility]  PHQ-2 Total Score 0   Flowsheet Row UC from 05/16/2023 in [facility]  UC from 02/21/2023 in [facility] at  [facility]  ED from 08/14/2021 in [facility] Department at [facility]  C-SSRS RISK CATEGORY No Risk No Risk No Risk  Assessment and Plan:  Problem 1: Mood(chronic and stable) - Decrease to 25mg  once daily. - Ind therapy with Russell Pinal at [facility]. - Recommended life style interventions for weight management and mental health.    Problem 2: Generalized Anxiety with Panic attack(chronic andstable) Plan:           - Continue Lexapro  5 mg daily              - Continue Hydroxyzine  25mg  TID as needed for anxiety.             - Continue BuSpar  10mg  at lunch, and 20mg  at night.   Problem 3: Sleep Plan:             - Stop Trazadone   Plan - Psychotherapy: Currently participating in therapy. - Education: Patient has been educated on how to reach out to this provider by calling the clinic or utilizing [employer].  Patient has been educated on medications, purpose,'s side effects, adverse reactions, dosage. - Follow-Up: Patient will follow-up in 1 month - Referrals: No referrals at this time. - Safety Planning:  The patient has been educated, if they should have suicidal thoughts with or without a plan to call 911, or go to the closest emergency department.  Pt verbalized understanding.  Pt denies firearms within the home.  Pt also agrees to call the clinic should they have worsening symptoms before the next appointment.   Patient/Guardian was advised Release of Information must be obtained prior to any record release in order to collaborate their care with an outside provider. Patient/Guardian was advised if they have not already done so to contact the registration department to sign all necessary forms in order for us  to release information regarding their care.   Consent: Patient/Guardian gives verbal consent for treatment and assignment of benefits for services provided during this visit. Patient/Guardian expressed understanding and agreed to proceed.     [name], NP 06/06/2024, 2:08 PM

## 2024-06-20 ENCOUNTER — Telehealth: Admitting: Psychiatry

## 2024-06-20 DIAGNOSIS — F411 Generalized anxiety disorder: Secondary | ICD-10-CM

## 2024-06-20 DIAGNOSIS — F410 Panic disorder [episodic paroxysmal anxiety] without agoraphobia: Secondary | ICD-10-CM | POA: Diagnosis not present

## 2024-06-20 DIAGNOSIS — F330 Major depressive disorder, recurrent, mild: Secondary | ICD-10-CM | POA: Diagnosis not present

## 2024-06-20 NOTE — Progress Notes (Signed)
 BH MD/PA/NP OP Progress Note  06/20/2024 2:05 PM Colleen Tapia  MRN:  [id number]  Chief Complaint: Routine Follow-up Virtual Visit via Video Note  I connected with Colleen Tapia on 06/20/24 at  2:00 PM EST by a video enabled telemedicine application and verified that I am speaking with the correct person using two identifiers.  Location: Patient: [address]  [place] [address]  Provider: [facility] office of Provider   I discussed the limitations of evaluation and management by telemedicine and the availability of in person appointments. The patient expressed understanding and agreed to proceed.    I discussed the assessment and treatment plan with the patient. The patient was provided an opportunity to ask questions and all were answered. The patient agreed with the plan and demonstrated an understanding of the instructions.   The patient was advised to call back or seek an in-person evaluation if the symptoms worsen or if the condition fails to improve as anticipated.  I provided 30 minutes of non-face-to-face time during this encounter.   [name], NP   HPI: 28 year old female presenting ARPA for follow-up.  Patient reports that she is doing okay as she decreases on Lamictal  down to 25 mg once daily.  Patient reports that she has been taking this medication for the last 3 years.  Patient states that she has not had any symptoms of hypomania or mania stating no auditory visual hallucinations as well as paranoia.  Patient reports she continues to have stress regarding her mother who is now going through chemotherapy.  She is not sure if her family is telling her enough but states that she often gets conflicting stories whenever she talks to her mom or dad separately.  Patient ports she would like to find more consistency but states she understands and stressed they are going through.  Patient with mother going through cancer is a big stressor at this time for her as  she also looks for a new job.  Patient denies any sudden mood swings and states that she feels okay since starting the Lamictal  decreased.  Patient with no other question concerns.  Patient is in agreement with treatment plan.  Patient is gonna continue and stop Lamictal .  Patient is in agreement treatment plan.  Patient to follow-up in 2 weeks to assess assess symptoms after stopping Lamictal .  Patient denies SI, HI, AVH. Visit Diagnosis:    ICD-10-CM   1. Generalized anxiety disorder with panic attacks  F41.1    F41.0     2. Mild episode of recurrent major depressive disorder  F33.0       Past Psychiatric History:  Previous Psych Hospitalizations: - Denies Outpatient treatment:  - Previously part of this clinic. Medications Current: - Lexapro  5 mg once daily -Hydroxyzine  25 mg 3 times a day as needed for anxiety -Trazodone  50 mg once daily as needed for sleep -BuSpar  20mg  BID 10 mg daily at lunchtime Next Steps: - Explore possible rule out bipolar disorder. Medication Trials: - Zoloft  100 mg, patient reports side effects and weight gain. Suicide & Violence: - Denies Substance Use: - Denies Psychotherapy: - Currently participating weekly therapy Legal:  Denies  Past Medical History:  Past Medical History:  Diagnosis Date   Anxiety    Thyroid  disease     Past Surgical History:  Procedure Laterality Date   WISDOM TOOTH EXTRACTION      Family Psychiatric History: No additional  Family History:  Family History  Problem Relation  Age of Onset   Anxiety disorder Mother    Depression Mother     Social History:  Social History   Socioeconomic History   Marital status: Single    Spouse name: Not on file   Number of children: 0   Years of education: Not on file   Highest education level: Some college, no degree  Occupational History   Not on file  Tobacco Use   Smoking status: Never   Smokeless tobacco: Never  Vaping Use   Vaping status: Never Used   Substance and Sexual Activity   Alcohol use: Yes    Alcohol/week: 3.0 - 5.0 standard drinks of alcohol    Types: 3 - 4 Shots of liquor per week   Drug use: Not Currently   Sexual activity: Not Currently  Other Topics Concern   Not on file  Social History Narrative   Not on file   Social Drivers of Health   Financial Resource Strain: Low Risk (05/26/2024)   Received from [facility]   Overall Financial Resource Strain (CARDIA)    How hard is it for you to pay for the very basics like food, housing, medical care, and heating?: Not hard at all  Food Insecurity: No Food Insecurity (05/26/2024)   Received from [facility]   Hunger Vital Sign    Within the past 12 months, you worried that your food would run out before you got the money to buy more.: Never true    Within the past 12 months, the food you bought just didn't last and you didn't have money to get more.: Never true  Transportation Needs: No Transportation Needs (05/26/2024)   Received from [facility] - [facility]    Lack of Transportation (Medical): No    Lack of Transportation (Non-Medical): No  Physical Activity: Sufficiently Active (06/23/2018)   Exercise Vital Sign    Days of Exercise per Week: 5 days    Minutes of Exercise per Session: 50 min  Stress: Not on file  Social Connections: Unknown (06/23/2018)   Social Connection and Isolation Panel    Frequency of Communication with Friends and Family: Not on file    Frequency of Social Gatherings with Friends and Family: Not on file    Attends Religious Services: More than 4 times per year    Active Member of [employer] or Organizations: Yes    Attends Banker Meetings: More than 4 times per year    Marital Status: Never married    Allergies:  Allergies  Allergen Reactions   No Known Allergies     Metabolic Disorder Labs: No results found for: HGBA1C, MPG Lab Results  Component Value Date   PROLACTIN 13.5 02/01/2020    No results found for: CHOL, TRIG, HDL, CHOLHDL, VLDL, LDLCALC Lab Results  Component Value Date   TSH 1.15 03/07/2024   TSH 2.350 02/16/2024    Therapeutic Level Labs: No results found for: LITHIUM No results found for: VALPROATE No results found for: CBMZ  Current Medications: Current Outpatient Medications  Medication Sig Dispense Refill   busPIRone  (BUSPAR ) 10 MG tablet TAKE 2 TABLETS BY MOUTH EACH MORNING, 1 TABLET AT LUNCH, & 2 TABLETS EACH EVENING 150 tablet 5   calcitRIOL  (ROCALTROL ) 0.25 MCG capsule Take 1 capsule (0.25 mcg total) by mouth daily. 90 capsule 3   Calcium Carb-Cholecalciferol (CALCIUM PLUS VITAMIN D3 PO) Take 1,200 mg by mouth. Plus 25 mcg Vitamin D3  escitalopram  (LEXAPRO ) 5 MG tablet TAKE 1 TABLET (5 MG TOTAL) BY MOUTH DAILY. 90 tablet 1   escitalopram  (LEXAPRO ) 5 MG tablet Take 1 tablet (5 mg total) by mouth daily. 30 tablet 2   fluticasone (FLONASE) 50 MCG/ACT nasal spray Place into the nose.     hydrOXYzine  (ATARAX ) 25 MG tablet TAKE 0.5-1 TABLETS (12.5-25 MG TOTAL) BY MOUTH 3 (THREE) TIMES DAILY AS NEEDED. 90 tablet 1   Lactobacillus (AZO COMPLETE FEMININE BALANCE PO) AZO Complete Feminine Balance     lamoTRIgine  (LAMICTAL ) 25 MG tablet Take 1 tablet (25 mg total) by mouth 2 (two) times daily. 60 tablet 5   levocetirizine (XYZAL) 5 MG tablet Take 5 mg by mouth every evening.     levothyroxine  (SYNTHROID ) 50 MCG tablet Take 1 tablet (50 mcg total) by mouth daily. 90 tablet 3   lurasidone  (LATUDA ) 20 MG TABS tablet TAKE 1 TABLET BY MOUTH DAILY WITH SUPPER. 30 tablet 1   norethindrone-ethinyl estradiol-FE (LOESTRIN FE) 1-20 MG-MCG tablet Take 1 tablet by mouth daily.     tirzepatide (ZEPBOUND) 5 MG/0.5ML Pen Inject 5 mg into the skin once a week.     traZODone  (DESYREL ) 50 MG tablet TAKE 1 TABLET BY MOUTH EVERYDAY AT BEDTIME 30 tablet 5   Current Facility-Administered Medications  Medication Dose Route Frequency Provider Last Rate Last  Admin   betamethasone  acetate-betamethasone  sodium phosphate (CELESTONE ) injection 3 mg  3 mg Intra-articular Once Evans, Brent M, DPM         Musculoskeletal: Strength & Muscle Tone: within normal limits Gait & Station: normal Patient leans: N/A   Psychiatric Specialty Exam: Review of Systems  Constitutional: Negative.   HENT: Negative.    Eyes: Negative.   Respiratory: Negative.    Cardiovascular: Negative.   Gastrointestinal: Negative.   Endocrine: Negative.   Genitourinary: Negative.   Musculoskeletal: Negative.   Skin: Negative.   Allergic/Immunologic: Negative.   Neurological: Negative.   Hematological: Negative.   Psychiatric/Behavioral:  Positive for dysphoric mood. The patient is nervous/anxious.      There were no vitals taken for this visit.There is no height or weight on file to calculate BMI.  General Appearance: Well Groomed  Eye Contact:  Good  Speech:  Clear and Coherent  Volume:  Normal  Mood:  Anxious and Depressed  Affect:  Appropriate  Thought Process:  Coherent  Orientation:  Full (Time, Place, and Person)  Thought Content: Logical   Suicidal Thoughts:  No  Homicidal Thoughts:  No  Memory:  Immediate;   Good Recent;   Good Remote;   Good  Judgement:  Good  Insight:  Good  Psychomotor Activity:  Normal  Concentration:  Concentration: Good and Attention Span: Good  Recall:  Good  Fund of Knowledge: Good  Language: Good  Akathisia:  No  Handed:  Right  AIMS (if indicated): not done  Assets:  Desire for Improvement Financial Resources/Insurance Housing  ADL's:  Intact  Cognition: WNL  Sleep:  Fair   Screenings: GAD-7    Flowsheet Row Video Visit from 04/25/2024 in [facility]  Total GAD-7 Score 6   PHQ2-9    Flowsheet Row Video Visit from 04/25/2024 in [facility]  PHQ-2 Total Score 0   Flowsheet Row UC from 05/16/2023 in [facility] at  [facility]  UC from 02[facility]21/2023 in [facility] at [facility]  ED from 08/14/2021 in [facility] at [facility]  C-SSRS RISK CATEGORY No Risk No Risk No Risk     Assessment and Plan:  Problem 1: Mood(chronic and stable) - Stop lamictal   - Ind therapy with Russell Pinal at [facility]. - Recommended life style interventions for weight management and mental health.    Problem 2: Generalized Anxiety with Panic attack(chronic andstable) Plan:           - Continue Lexapro  5 mg daily              - Continue Hydroxyzine  25mg  TID as needed for anxiety.             - Continue BuSpar  10mg  at lunch, and 20mg  at night.   Problem 3: Sleep Plan:             - Stop Trazadone   Plan - Psychotherapy: Currently participating in therapy. - Education: Patient has been educated on how to reach out to this provider by calling the clinic or utilizing [employer].  Patient has been educated on medications, purpose,'s side effects, adverse reactions, dosage. - Follow-Up: Patient will follow-up in 1 month - Referrals: No referrals at this time. - Safety Planning:  The patient has been educated, if they should have suicidal thoughts with or without a plan to call 911, or go to the closest emergency department.  Pt verbalized understanding.  Pt denies firearms within the home.  Pt also agrees to call the clinic should they have worsening symptoms before the next appointment.   Patient/Guardian was advised Release of Information must be obtained prior to any record release in order to collaborate their care with an outside provider. Patient/Guardian was advised if they have not already done so to contact the registration department to sign all necessary forms in order for us  to release information regarding their care.   Consent: Patient/Guardian gives verbal consent for treatment and assignment of benefits for services provided during this visit. Patient/Guardian expressed  understanding and agreed to proceed.    [name], NP 06/20/2024, 2:05 PM

## 2024-07-04 ENCOUNTER — Telehealth: Admitting: Psychiatry

## 2024-07-04 DIAGNOSIS — F411 Generalized anxiety disorder: Secondary | ICD-10-CM

## 2024-07-04 DIAGNOSIS — F330 Major depressive disorder, recurrent, mild: Secondary | ICD-10-CM | POA: Diagnosis not present

## 2024-07-04 DIAGNOSIS — F410 Panic disorder [episodic paroxysmal anxiety] without agoraphobia: Secondary | ICD-10-CM | POA: Diagnosis not present

## 2024-07-04 MED ORDER — ESCITALOPRAM OXALATE 5 MG PO TABS: 5.0000 mg | ORAL_TABLET | Freq: Every day | ORAL | 2 refills | Status: DC

## 2024-07-04 MED ORDER — ESCITALOPRAM OXALATE 5 MG PO TABS: 5.0000 mg | ORAL_TABLET | Freq: Every day | ORAL | 2 refills | Status: AC

## 2024-07-04 MED ORDER — BUSPIRONE HCL 10 MG PO TABS: ORAL_TABLET | ORAL | 5 refills | Status: DC

## 2024-07-04 MED ORDER — BUSPIRONE HCL 10 MG PO TABS: ORAL_TABLET | ORAL | 5 refills | Status: AC

## 2024-07-04 NOTE — Progress Notes (Signed)
 BH MD/PA/NP OP Progress Note  07/04/2024 2:06 PM Colleen Tapia  MRN:  [id number]  Chief Complaint: routine Follow-up Virtual Visit via Video Note  I connected with Colleen Tapia Rake on 07/04/24 at  2:00 PM EST by a video enabled telemedicine application and verified that I am speaking with the correct person using two identifiers.  Location: Patient: [address]  [place] [address]  Provider: [facility] Office of Provider   I discussed the limitations of evaluation and management by telemedicine and the availability of in person appointments. The patient expressed understanding and agreed to proceed.    I discussed the assessment and treatment plan with the patient. The patient was provided an opportunity to ask questions and all were answered. The patient agreed with the plan and demonstrated an understanding of the instructions.   The patient was advised to call back or seek an in-person evaluation if the symptoms worsen or if the condition fails to improve as anticipated.  I provided 30 minutes of non-face-to-face time during this encounter.   [name], NP   HPI: 28 year old female presenting ARPA for follow-up.  Patient reports that she is doing well stating that she is wanted to continue current medications and states she is doing well without Lamictal .  Patient reports that she had a good holiday season stating that she is feeling better and that she does not need the Lamictal  anymore.  Patient reports that the current medication regimen of BuSpar  and the Lexapro  is doing well and managing her mood.  Patient reports that she hopes to be able to find a job soon but states that she is getting discouraged as she is getting multiple rejections stating she is however qualified.  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  Patient with no other  question concerns.  Patient is in agreement with treatment plan.  Patient denies SI, HI, AVH.  Patient will be following this provider to the next practice.  Patient with no follow-up needed. Visit Diagnosis:    ICD-10-CM   1. Mild episode of recurrent major depressive disorder  F33.0     2. Generalized anxiety disorder with panic attacks  F41.1 busPIRone  (BUSPAR ) 10 MG tablet   F41.0 DISCONTINUED: busPIRone  (BUSPAR ) 10 MG tablet      Past Psychiatric History:  Previous Psych Hospitalizations: - Denies Outpatient treatment:  - Previously part of this clinic. Medications Current: - Lexapro  5 mg once daily -Hydroxyzine  25 mg 3 times a day as needed for anxiety -BuSpar  20mg  BID 10 mg daily at lunchtime Next Steps: - Explore possible rule out bipolar disorder. Medication Trials: - Zoloft  100 mg, patient reports side effects and weight gain. Suicide & Violence: - Denies Substance Use: - Denies Psychotherapy: - Currently participating weekly therapy Legal:  Denies  Past Medical History:  Past Medical History:  Diagnosis Date   Anxiety    Thyroid  disease     Past Surgical History:  Procedure Laterality Date   WISDOM TOOTH EXTRACTION      Family Psychiatric History: No additional  Family History:  Family History  Problem Relation Age of Onset   Anxiety disorder Mother    Depression Mother     Social History:  Social History   Socioeconomic History   Marital status: Single    Spouse name: Not on file   Number  of children: 0   Years of education: Not on file   Highest education level: Some college, no degree  Occupational History   Not on file  Tobacco Use   Smoking status: Never   Smokeless tobacco: Never  Vaping Use   Vaping status: Never Used  Substance and Sexual Activity   Alcohol use: Yes    Alcohol/week: 3.0 - 5.0 standard drinks of alcohol    Types: 3 - 4 Shots of liquor per week   Drug use: Not Currently   Sexual activity: Not Currently  Other  Topics Concern   Not on file  Social History Narrative   Not on file   Social Drivers of Health   Financial Resource Strain: Low Risk (05/26/2024)   Received from [facility]   Overall Financial Resource Strain (CARDIA)    How hard is it for you to pay for the very basics like food, housing, medical care, and heating?: Not hard at all  Food Insecurity: No Food Insecurity (05/26/2024)   Received from [facility]   Hunger Vital Sign    Within the past 12 months, you worried that your food would run out before you got the money to buy more.: Never true    Within the past 12 months, the food you bought just didn't last and you didn't have money to get more.: Never true  Transportation Needs: No Transportation Needs (05/26/2024)   Received from [facility] - [facility]    Lack of Transportation (Medical): No    Lack of Transportation (Non-Medical): No  Physical Activity: Sufficiently Active (06/23/2018)   Exercise Vital Sign    Days of Exercise per Week: 5 days    Minutes of Exercise per Session: 50 min  Stress: Not on file  Social Connections: Unknown (06/23/2018)   Social Connection and Isolation Panel    Frequency of Communication with Friends and Family: Not on file    Frequency of Social Gatherings with Friends and Family: Not on file    Attends Religious Services: More than 4 times per year    Active Member of [employer] or Organizations: Yes    Attends Banker Meetings: More than 4 times per year    Marital Status: Never married    Allergies:  Allergies  Allergen Reactions   No Known Allergies     Metabolic Disorder Labs: No results found for: HGBA1C, MPG Lab Results  Component Value Date   PROLACTIN 13.5 02/01/2020   No results found for: CHOL, TRIG, HDL, CHOLHDL, VLDL, LDLCALC Lab Results  Component Value Date   TSH 1.15 03/07/2024   TSH 2.350 02/16/2024    Therapeutic Level Labs: No results found for:  LITHIUM No results found for: VALPROATE No results found for: CBMZ  Current Medications: Current Outpatient Medications  Medication Sig Dispense Refill   busPIRone  (BUSPAR ) 10 MG tablet TAKE 2 TABLETS BY MOUTH EACH MORNING, 1 TABLET AT LUNCH, & 2 TABLETS EACH EVENING 150 tablet 5   calcitRIOL  (ROCALTROL ) 0.25 MCG capsule Take 1 capsule (0.25 mcg total) by mouth daily. 90 capsule 3   Calcium Carb-Cholecalciferol (CALCIUM PLUS VITAMIN D3 PO) Take 1,200 mg by mouth. Plus 25 mcg Vitamin D3     escitalopram  (LEXAPRO ) 5 MG tablet TAKE 1 TABLET (5 MG TOTAL) BY MOUTH DAILY. 90 tablet 1   escitalopram  (LEXAPRO ) 5 MG tablet Take 1 tablet (5 mg total) by mouth daily. 30 tablet 2   fluticasone (FLONASE) 50 MCG/ACT  nasal spray Place into the nose.     hydrOXYzine  (ATARAX ) 25 MG tablet TAKE 0.5-1 TABLETS (12.5-25 MG TOTAL) BY MOUTH 3 (THREE) TIMES DAILY AS NEEDED. 90 tablet 1   Lactobacillus (AZO COMPLETE FEMININE BALANCE PO) AZO Complete Feminine Balance     lamoTRIgine  (LAMICTAL ) 25 MG tablet Take 1 tablet (25 mg total) by mouth 2 (two) times daily. 60 tablet 5   levocetirizine (XYZAL) 5 MG tablet Take 5 mg by mouth every evening.     levothyroxine  (SYNTHROID ) 50 MCG tablet Take 1 tablet (50 mcg total) by mouth daily. 90 tablet 3   lurasidone  (LATUDA ) 20 MG TABS tablet TAKE 1 TABLET BY MOUTH DAILY WITH SUPPER. 30 tablet 1   norethindrone-ethinyl estradiol-FE (LOESTRIN FE) 1-20 MG-MCG tablet Take 1 tablet by mouth daily.     tirzepatide (ZEPBOUND) 5 MG/0.5ML Pen Inject 5 mg into the skin once a week.     traZODone  (DESYREL ) 50 MG tablet TAKE 1 TABLET BY MOUTH EVERYDAY AT BEDTIME 30 tablet 5   Current Facility-Administered Medications  Medication Dose Route Frequency Provider Last Rate Last Admin   betamethasone  acetate-betamethasone  sodium phosphate (CELESTONE ) injection 3 mg  3 mg Intra-articular Once Evans, Brent M, DPM         Musculoskeletal: Strength & Muscle Tone: within normal  limits Gait & Station: normal Patient leans: N/A   Psychiatric Specialty Exam: Review of Systems  Constitutional: Negative.   HENT: Negative.    Eyes: Negative.   Respiratory: Negative.    Cardiovascular: Negative.   Gastrointestinal: Negative.   Endocrine: Negative.   Genitourinary: Negative.   Musculoskeletal: Negative.   Skin: Negative.   Allergic/Immunologic: Negative.   Neurological: Negative.   Hematological: Negative.   Psychiatric/Behavioral:  Positive for dysphoric mood. The patient is nervous/anxious.      There were no vitals taken for this visit.There is no height or weight on file to calculate BMI.  General Appearance: Well Groomed  Eye Contact:  Good  Speech:  Clear and Coherent  Volume:  Normal  Mood:  Anxious and Depressed  Affect:  Appropriate  Thought Process:  Coherent  Orientation:  Full (Time, Place, and Person)  Thought Content: Logical   Suicidal Thoughts:  No  Homicidal Thoughts:  No  Memory:  Immediate;   Good Recent;   Good Remote;   Good  Judgement:  Good  [facility]ight:  Good  Psychomotor Activity:  Normal  Concentration:  Concentration: Good and Attention Span: Good  Recall:  Good  Fund of Knowledge: Good  Language: Good  Akathisia:  No  Handed:  Right  AIMS (if indicated): not done  Assets:  Desire for Improvement Financial Resources/Insurance Housing  ADL's:  Intact  Cognition: WNL  Sleep:  Fair    Screenings: GAD-7    Flowsheet Row Video Visit from 04/25/2024 in [facility]  Total GAD-7 Score 6   PHQ2-9    Flowsheet Row Video Visit from 04/25/2024 in [facility]  PHQ-2 Total Score 0   Flowsheet Row UC from 05/16/2023 in [facility] at [facility]  UC from 02/21/2023 in [facility] at [facility]  ED from 08/14/2021 in [facility] at [facility]  C-SSRS RISK CATEGORY No Risk No Risk No Risk      Assessment and Plan:  Problem 1: Mood(chronic and stable) - Ind therapy with Russell Pinal at [facility]. - Recommended life style interventions for weight management and mental health.  Problem 2: Generalized Anxiety with Panic attack(chronic andstable) Plan:           - Continue Lexapro  5 mg daily              - Continue Hydroxyzine  25mg  TID as needed for anxiety.             - Continue BuSpar  10mg  at lunch, and 20mg  at night.   Problem 3: Sleep Plan:              Plan - Psychotherapy: Currently participating in therapy. - Education: Patient has been educated on how to reach out to this provider by calling the clinic or utilizing [employer].  Patient has been educated on medications, purpose,'s side effects, adverse reactions, dosage. - Follow-Up: Patient will follow-up in 1 month - Referrals: No referrals at this time. - Safety Planning:  The patient has been educated, if they should have suicidal thoughts with or without a plan to call 911, or go to the closest emergency department.  Pt verbalized understanding.  Pt denies firearms within the home.  Pt also agrees to call the clinic should they have worsening symptoms before the next appointment.  Patient/Guardian was advised Release of Information must be obtained prior to any record release in order to collaborate their care with an outside provider. Patient/Guardian was advised if they have not already done so to contact the registration department to sign all necessary forms in order for us  to release information regarding their care.   Consent: Patient/Guardian gives verbal consent for treatment and assignment of benefits for services provided during this visit. Patient/Guardian expressed understanding and agreed to proceed.    [name], NP 07/04/2024, 2:06 PM

## 2024-07-18 ENCOUNTER — Telehealth: Payer: Self-pay

## 2024-07-18 ENCOUNTER — Other Ambulatory Visit: Payer: Self-pay | Admitting: Psychiatry

## 2024-07-18 MED ORDER — HYDROXYZINE HCL 25 MG PO TABS: 12.5000 mg | ORAL_TABLET | Freq: Three times a day (TID) | ORAL | 1 refills | Status: AC | PRN

## 2024-07-18 NOTE — Telephone Encounter (Signed)
 Fax received from [id number] - [place], [place] - [address]  for refill of Medication hydrOXYzine  (ATARAX ) 25 MG tablet   Verified  with pharmacist no refills on file for pt  Last seen 07/04/24 Next apt not on file

## 2024-08-19 ENCOUNTER — Other Ambulatory Visit: Payer: Self-pay | Admitting: Endocrinology

## 2024-08-20 DIAGNOSIS — E039 Hypothyroidism, unspecified: Secondary | ICD-10-CM

## 2024-09-12 ENCOUNTER — Ambulatory Visit: Admitting: Endocrinology
